# Patient Record
Sex: Female | Born: 1935
Health system: Southern US, Community
[De-identification: ages and names within clinical notes are randomized; demographics above are authoritative.]

## PROBLEM LIST (undated history)

## (undated) DIAGNOSIS — I509 Heart failure, unspecified: Secondary | ICD-10-CM

## (undated) DIAGNOSIS — D473 Essential (hemorrhagic) thrombocythemia: Secondary | ICD-10-CM

## (undated) DIAGNOSIS — I10 Essential (primary) hypertension: Secondary | ICD-10-CM

## (undated) DIAGNOSIS — E079 Disorder of thyroid, unspecified: Secondary | ICD-10-CM

## (undated) DIAGNOSIS — D75839 Thrombocytosis, unspecified: Secondary | ICD-10-CM

## (undated) DIAGNOSIS — R7303 Prediabetes: Secondary | ICD-10-CM

## (undated) HISTORY — PX: CHOLECYSTECTOMY: SHX55

---

## 2003-01-27 ENCOUNTER — Encounter: Payer: Self-pay | Admitting: Family Medicine

## 2003-01-27 ENCOUNTER — Ambulatory Visit (HOSPITAL_COMMUNITY): Admission: RE | Admit: 2003-01-27 | Discharge: 2003-01-27 | Payer: Self-pay | Admitting: Family Medicine

## 2003-09-17 ENCOUNTER — Ambulatory Visit (HOSPITAL_COMMUNITY): Admission: RE | Admit: 2003-09-17 | Discharge: 2003-09-17 | Payer: Self-pay | Admitting: Family Medicine

## 2004-05-23 ENCOUNTER — Ambulatory Visit (HOSPITAL_COMMUNITY): Admission: RE | Admit: 2004-05-23 | Discharge: 2004-05-23 | Payer: Self-pay | Admitting: Family Medicine

## 2004-07-12 ENCOUNTER — Ambulatory Visit: Payer: Self-pay | Admitting: *Deleted

## 2005-05-05 ENCOUNTER — Ambulatory Visit: Payer: Self-pay | Admitting: Family Medicine

## 2005-06-02 ENCOUNTER — Ambulatory Visit: Payer: Self-pay | Admitting: Internal Medicine

## 2005-08-01 ENCOUNTER — Ambulatory Visit: Payer: Self-pay | Admitting: Family Medicine

## 2005-08-08 ENCOUNTER — Ambulatory Visit (HOSPITAL_COMMUNITY): Admission: RE | Admit: 2005-08-08 | Discharge: 2005-08-08 | Payer: Self-pay | Admitting: Family Medicine

## 2005-08-09 ENCOUNTER — Ambulatory Visit: Payer: Self-pay | Admitting: Hematology and Oncology

## 2005-08-18 ENCOUNTER — Encounter (INDEPENDENT_AMBULATORY_CARE_PROVIDER_SITE_OTHER): Payer: Self-pay | Admitting: *Deleted

## 2005-08-18 ENCOUNTER — Other Ambulatory Visit: Admission: RE | Admit: 2005-08-18 | Discharge: 2005-08-18 | Payer: Self-pay | Admitting: Hematology and Oncology

## 2005-08-22 ENCOUNTER — Ambulatory Visit (HOSPITAL_COMMUNITY): Admission: RE | Admit: 2005-08-22 | Discharge: 2005-08-22 | Payer: Self-pay | Admitting: Hematology and Oncology

## 2005-10-17 ENCOUNTER — Ambulatory Visit: Payer: Self-pay | Admitting: Hematology and Oncology

## 2005-11-09 ENCOUNTER — Emergency Department (HOSPITAL_COMMUNITY): Admission: EM | Admit: 2005-11-09 | Discharge: 2005-11-09 | Payer: Self-pay | Admitting: Emergency Medicine

## 2006-02-01 ENCOUNTER — Ambulatory Visit: Payer: Self-pay | Admitting: Hematology and Oncology

## 2006-03-28 ENCOUNTER — Ambulatory Visit: Payer: Self-pay | Admitting: Hematology and Oncology

## 2006-03-30 LAB — CBC WITH DIFFERENTIAL/PLATELET
BASO%: 0.8 % (ref 0.0–2.0)
Basophils Absolute: 0 10*3/uL (ref 0.0–0.1)
EOS%: 4.7 % (ref 0.0–7.0)
Eosinophils Absolute: 0.3 10*3/uL (ref 0.0–0.5)
HCT: 34.2 % — ABNORMAL LOW (ref 34.8–46.6)
HGB: 11.3 g/dL — ABNORMAL LOW (ref 11.6–15.9)
LYMPH%: 38.8 % (ref 14.0–48.0)
MCH: 33 pg (ref 26.0–34.0)
MCHC: 33.1 g/dL (ref 32.0–36.0)
MCV: 99.6 fL (ref 81.0–101.0)
MONO#: 0.9 10*3/uL (ref 0.1–0.9)
MONO%: 13.9 % — ABNORMAL HIGH (ref 0.0–13.0)
NEUT#: 2.7 10*3/uL (ref 1.5–6.5)
NEUT%: 41.8 % (ref 39.6–76.8)
Platelets: 419 10*3/uL — ABNORMAL HIGH (ref 145–400)
RBC: 3.43 10*6/uL — ABNORMAL LOW (ref 3.70–5.32)
RDW: 14.1 % (ref 11.3–14.5)
WBC: 6.4 10*3/uL (ref 3.9–10.0)
lymph#: 2.5 10*3/uL (ref 0.9–3.3)

## 2006-03-30 LAB — COMPREHENSIVE METABOLIC PANEL
ALT: 9 U/L (ref 0–40)
AST: 19 U/L (ref 0–37)
Albumin: 3.8 g/dL (ref 3.5–5.2)
Alkaline Phosphatase: 69 U/L (ref 39–117)
BUN: 22 mg/dL (ref 6–23)
CO2: 30 mEq/L (ref 19–32)
Calcium: 9.3 mg/dL (ref 8.4–10.5)
Chloride: 101 mEq/L (ref 96–112)
Creatinine, Ser: 1.1 mg/dL (ref 0.4–1.2)
Glucose, Bld: 106 mg/dL — ABNORMAL HIGH (ref 70–99)
Potassium: 3.5 mEq/L (ref 3.5–5.3)
Sodium: 139 mEq/L (ref 135–145)
Total Bilirubin: 0.7 mg/dL (ref 0.3–1.2)
Total Protein: 8.1 g/dL (ref 6.0–8.3)

## 2007-01-09 ENCOUNTER — Ambulatory Visit: Payer: Self-pay | Admitting: Hematology and Oncology

## 2007-01-11 LAB — CBC WITH DIFFERENTIAL/PLATELET
BASO%: 0.8 % (ref 0.0–2.0)
Basophils Absolute: 0.1 10*3/uL (ref 0.0–0.1)
EOS%: 3.4 % (ref 0.0–7.0)
Eosinophils Absolute: 0.3 10*3/uL (ref 0.0–0.5)
HCT: 37 % (ref 34.8–46.6)
HGB: 12.4 g/dL (ref 11.6–15.9)
LYMPH%: 42.7 % (ref 14.0–48.0)
MCH: 28.5 pg (ref 26.0–34.0)
MCHC: 33.6 g/dL (ref 32.0–36.0)
MCV: 84.9 fL (ref 81.0–101.0)
MONO#: 0.8 10*3/uL (ref 0.1–0.9)
MONO%: 9.7 % (ref 0.0–13.0)
NEUT#: 3.4 10*3/uL (ref 1.5–6.5)
NEUT%: 43.4 % (ref 39.6–76.8)
Platelets: 716 10*3/uL — ABNORMAL HIGH (ref 145–400)
RBC: 4.36 10*6/uL (ref 3.70–5.32)
RDW: 14.7 % — ABNORMAL HIGH (ref 11.3–14.5)
WBC: 7.7 10*3/uL (ref 3.9–10.0)
lymph#: 3.3 10*3/uL (ref 0.9–3.3)

## 2007-01-24 LAB — BASIC METABOLIC PANEL
BUN: 18 mg/dL (ref 6–23)
CO2: 28 mEq/L (ref 19–32)
Calcium: 9.8 mg/dL (ref 8.4–10.5)
Chloride: 100 mEq/L (ref 96–112)
Creatinine, Ser: 1 mg/dL (ref 0.40–1.20)
Glucose, Bld: 85 mg/dL (ref 70–99)
Potassium: 3.6 mEq/L (ref 3.5–5.3)
Sodium: 140 mEq/L (ref 135–145)

## 2007-01-24 LAB — CBC WITH DIFFERENTIAL/PLATELET
BASO%: 0.7 % (ref 0.0–2.0)
Basophils Absolute: 0 10*3/uL (ref 0.0–0.1)
EOS%: 2.6 % (ref 0.0–7.0)
Eosinophils Absolute: 0.1 10*3/uL (ref 0.0–0.5)
HCT: 34.9 % (ref 34.8–46.6)
HGB: 11.8 g/dL (ref 11.6–15.9)
LYMPH%: 51.7 % — ABNORMAL HIGH (ref 14.0–48.0)
MCH: 28.7 pg (ref 26.0–34.0)
MCHC: 34 g/dL (ref 32.0–36.0)
MCV: 84.6 fL (ref 81.0–101.0)
MONO#: 0.3 10*3/uL (ref 0.1–0.9)
MONO%: 6.8 % (ref 0.0–13.0)
NEUT#: 1.9 10*3/uL (ref 1.5–6.5)
NEUT%: 38.2 % — ABNORMAL LOW (ref 39.6–76.8)
Platelets: 532 10*3/uL — ABNORMAL HIGH (ref 145–400)
RBC: 4.13 10*6/uL (ref 3.70–5.32)
RDW: 14.2 % (ref 11.3–14.5)
WBC: 4.9 10*3/uL (ref 3.9–10.0)
lymph#: 2.5 10*3/uL (ref 0.9–3.3)

## 2007-02-08 LAB — CBC WITH DIFFERENTIAL/PLATELET
BASO%: 0.6 % (ref 0.0–2.0)
Basophils Absolute: 0 10*3/uL (ref 0.0–0.1)
EOS%: 2 % (ref 0.0–7.0)
Eosinophils Absolute: 0.1 10*3/uL (ref 0.0–0.5)
HCT: 34.1 % — ABNORMAL LOW (ref 34.8–46.6)
HGB: 11.7 g/dL (ref 11.6–15.9)
LYMPH%: 51.5 % — ABNORMAL HIGH (ref 14.0–48.0)
MCH: 29.5 pg (ref 26.0–34.0)
MCHC: 34.4 g/dL (ref 32.0–36.0)
MCV: 85.9 fL (ref 81.0–101.0)
MONO#: 0.4 10*3/uL (ref 0.1–0.9)
MONO%: 7.7 % (ref 0.0–13.0)
NEUT#: 2 10*3/uL (ref 1.5–6.5)
NEUT%: 38.2 % — ABNORMAL LOW (ref 39.6–76.8)
Platelets: 364 10*3/uL (ref 145–400)
RBC: 3.97 10*6/uL (ref 3.70–5.32)
RDW: 15 % — ABNORMAL HIGH (ref 11.3–14.5)
WBC: 5.3 10*3/uL (ref 3.9–10.0)
lymph#: 2.7 10*3/uL (ref 0.9–3.3)

## 2007-02-08 LAB — BASIC METABOLIC PANEL
BUN: 20 mg/dL (ref 6–23)
CO2: 29 mEq/L (ref 19–32)
Calcium: 9.5 mg/dL (ref 8.4–10.5)
Chloride: 99 mEq/L (ref 96–112)
Creatinine, Ser: 0.94 mg/dL (ref 0.40–1.20)
Glucose, Bld: 136 mg/dL — ABNORMAL HIGH (ref 70–99)
Potassium: 3.4 mEq/L — ABNORMAL LOW (ref 3.5–5.3)
Sodium: 140 mEq/L (ref 135–145)

## 2007-02-20 ENCOUNTER — Ambulatory Visit: Payer: Self-pay | Admitting: Hematology and Oncology

## 2007-02-22 LAB — CBC WITH DIFFERENTIAL/PLATELET
BASO%: 0.7 % (ref 0.0–2.0)
Basophils Absolute: 0 10*3/uL (ref 0.0–0.1)
EOS%: 1 % (ref 0.0–7.0)
Eosinophils Absolute: 0 10*3/uL (ref 0.0–0.5)
HCT: 33.2 % — ABNORMAL LOW (ref 34.8–46.6)
HGB: 11.5 g/dL — ABNORMAL LOW (ref 11.6–15.9)
LYMPH%: 56.1 % — ABNORMAL HIGH (ref 14.0–48.0)
MCH: 29.9 pg (ref 26.0–34.0)
MCHC: 34.7 g/dL (ref 32.0–36.0)
MCV: 86.3 fL (ref 81.0–101.0)
MONO#: 0.4 10*3/uL (ref 0.1–0.9)
MONO%: 9 % (ref 0.0–13.0)
NEUT#: 1.4 10*3/uL — ABNORMAL LOW (ref 1.5–6.5)
NEUT%: 33.2 % — ABNORMAL LOW (ref 39.6–76.8)
Platelets: 322 10*3/uL (ref 145–400)
RBC: 3.85 10*6/uL (ref 3.70–5.32)
RDW: 15.5 % — ABNORMAL HIGH (ref 11.3–14.5)
WBC: 4.3 10*3/uL (ref 3.9–10.0)
lymph#: 2.4 10*3/uL (ref 0.9–3.3)

## 2007-03-11 LAB — CBC WITH DIFFERENTIAL/PLATELET
BASO%: 0.6 % (ref 0.0–2.0)
Basophils Absolute: 0 10*3/uL (ref 0.0–0.1)
EOS%: 1.4 % (ref 0.0–7.0)
Eosinophils Absolute: 0.1 10*3/uL (ref 0.0–0.5)
HCT: 30.2 % — ABNORMAL LOW (ref 34.8–46.6)
HGB: 10.5 g/dL — ABNORMAL LOW (ref 11.6–15.9)
LYMPH%: 56.9 % — ABNORMAL HIGH (ref 14.0–48.0)
MCH: 30.7 pg (ref 26.0–34.0)
MCHC: 34.6 g/dL (ref 32.0–36.0)
MCV: 88.7 fL (ref 81.0–101.0)
MONO#: 0.5 10*3/uL (ref 0.1–0.9)
MONO%: 10.2 % (ref 0.0–13.0)
NEUT#: 1.4 10*3/uL — ABNORMAL LOW (ref 1.5–6.5)
NEUT%: 30.9 % — ABNORMAL LOW (ref 39.6–76.8)
Platelets: 290 10*3/uL (ref 145–400)
RBC: 3.41 10*6/uL — ABNORMAL LOW (ref 3.70–5.32)
RDW: 24.7 % — ABNORMAL HIGH (ref 11.3–14.5)
WBC: 4.6 10*3/uL (ref 3.9–10.0)
lymph#: 2.6 10*3/uL (ref 0.9–3.3)

## 2007-03-11 LAB — COMPREHENSIVE METABOLIC PANEL
ALT: 12 U/L (ref 0–35)
AST: 18 U/L (ref 0–37)
Albumin: 3.9 g/dL (ref 3.5–5.2)
Alkaline Phosphatase: 59 U/L (ref 39–117)
BUN: 21 mg/dL (ref 6–23)
CO2: 29 mEq/L (ref 19–32)
Calcium: 9 mg/dL (ref 8.4–10.5)
Chloride: 102 mEq/L (ref 96–112)
Creatinine, Ser: 0.9 mg/dL (ref 0.40–1.20)
Glucose, Bld: 145 mg/dL — ABNORMAL HIGH (ref 70–99)
Potassium: 3.2 mEq/L — ABNORMAL LOW (ref 3.5–5.3)
Sodium: 143 mEq/L (ref 135–145)
Total Bilirubin: 0.6 mg/dL (ref 0.3–1.2)
Total Protein: 7.7 g/dL (ref 6.0–8.3)

## 2007-04-18 ENCOUNTER — Ambulatory Visit: Payer: Self-pay | Admitting: Hematology and Oncology

## 2007-04-22 LAB — COMPREHENSIVE METABOLIC PANEL
ALT: 10 U/L (ref 0–35)
AST: 18 U/L (ref 0–37)
Albumin: 4 g/dL (ref 3.5–5.2)
Alkaline Phosphatase: 58 U/L (ref 39–117)
BUN: 23 mg/dL (ref 6–23)
CO2: 27 mEq/L (ref 19–32)
Calcium: 9.6 mg/dL (ref 8.4–10.5)
Chloride: 104 mEq/L (ref 96–112)
Creatinine, Ser: 1.11 mg/dL (ref 0.40–1.20)
Glucose, Bld: 112 mg/dL — ABNORMAL HIGH (ref 70–99)
Potassium: 3.5 mEq/L (ref 3.5–5.3)
Sodium: 142 mEq/L (ref 135–145)
Total Bilirubin: 0.7 mg/dL (ref 0.3–1.2)
Total Protein: 7.9 g/dL (ref 6.0–8.3)

## 2007-04-22 LAB — CBC WITH DIFFERENTIAL/PLATELET
BASO%: 0.6 % (ref 0.0–2.0)
Basophils Absolute: 0 10*3/uL (ref 0.0–0.1)
EOS%: 1.4 % (ref 0.0–7.0)
Eosinophils Absolute: 0.1 10*3/uL (ref 0.0–0.5)
HCT: 30.4 % — ABNORMAL LOW (ref 34.8–46.6)
HGB: 10.5 g/dL — ABNORMAL LOW (ref 11.6–15.9)
LYMPH%: 52.9 % — ABNORMAL HIGH (ref 14.0–48.0)
MCH: 33.7 pg (ref 26.0–34.0)
MCHC: 34.5 g/dL (ref 32.0–36.0)
MCV: 97.9 fL (ref 81.0–101.0)
MONO#: 0.5 10*3/uL (ref 0.1–0.9)
MONO%: 9.8 % (ref 0.0–13.0)
NEUT#: 1.6 10*3/uL (ref 1.5–6.5)
NEUT%: 35.3 % — ABNORMAL LOW (ref 39.6–76.8)
Platelets: 353 10*3/uL (ref 145–400)
RBC: 3.11 10*6/uL — ABNORMAL LOW (ref 3.70–5.32)
RDW: 27 % — ABNORMAL HIGH (ref 11.3–14.5)
WBC: 4.6 10*3/uL (ref 3.9–10.0)
lymph#: 2.4 10*3/uL (ref 0.9–3.3)

## 2007-06-10 ENCOUNTER — Ambulatory Visit: Payer: Self-pay | Admitting: Hematology and Oncology

## 2012-08-21 DIAGNOSIS — H11159 Pinguecula, unspecified eye: Secondary | ICD-10-CM | POA: Diagnosis not present

## 2012-08-21 DIAGNOSIS — H2510 Age-related nuclear cataract, unspecified eye: Secondary | ICD-10-CM | POA: Diagnosis not present

## 2012-08-22 DIAGNOSIS — E039 Hypothyroidism, unspecified: Secondary | ICD-10-CM | POA: Diagnosis not present

## 2012-08-22 DIAGNOSIS — Z0000 Encounter for general adult medical examination without abnormal findings: Secondary | ICD-10-CM | POA: Diagnosis not present

## 2012-08-22 DIAGNOSIS — I10 Essential (primary) hypertension: Secondary | ICD-10-CM | POA: Diagnosis not present

## 2012-08-22 DIAGNOSIS — Z719 Counseling, unspecified: Secondary | ICD-10-CM | POA: Diagnosis not present

## 2012-08-23 DIAGNOSIS — Z0000 Encounter for general adult medical examination without abnormal findings: Secondary | ICD-10-CM | POA: Diagnosis not present

## 2012-08-29 DIAGNOSIS — D473 Essential (hemorrhagic) thrombocythemia: Secondary | ICD-10-CM | POA: Diagnosis not present

## 2012-08-29 DIAGNOSIS — E039 Hypothyroidism, unspecified: Secondary | ICD-10-CM | POA: Diagnosis not present

## 2012-08-29 DIAGNOSIS — I10 Essential (primary) hypertension: Secondary | ICD-10-CM | POA: Diagnosis not present

## 2012-08-29 DIAGNOSIS — R030 Elevated blood-pressure reading, without diagnosis of hypertension: Secondary | ICD-10-CM | POA: Diagnosis not present

## 2012-09-03 DIAGNOSIS — D649 Anemia, unspecified: Secondary | ICD-10-CM | POA: Diagnosis not present

## 2012-09-03 DIAGNOSIS — D473 Essential (hemorrhagic) thrombocythemia: Secondary | ICD-10-CM | POA: Diagnosis not present

## 2012-09-12 DIAGNOSIS — D473 Essential (hemorrhagic) thrombocythemia: Secondary | ICD-10-CM | POA: Diagnosis not present

## 2012-09-17 DIAGNOSIS — D473 Essential (hemorrhagic) thrombocythemia: Secondary | ICD-10-CM | POA: Diagnosis not present

## 2012-09-25 DIAGNOSIS — R220 Localized swelling, mass and lump, head: Secondary | ICD-10-CM | POA: Diagnosis not present

## 2012-10-08 DIAGNOSIS — D473 Essential (hemorrhagic) thrombocythemia: Secondary | ICD-10-CM | POA: Diagnosis not present

## 2012-10-18 DIAGNOSIS — Z719 Counseling, unspecified: Secondary | ICD-10-CM | POA: Diagnosis not present

## 2012-10-18 DIAGNOSIS — Z7182 Exercise counseling: Secondary | ICD-10-CM | POA: Diagnosis not present

## 2012-10-18 DIAGNOSIS — E039 Hypothyroidism, unspecified: Secondary | ICD-10-CM | POA: Diagnosis not present

## 2014-07-11 ENCOUNTER — Emergency Department (HOSPITAL_COMMUNITY): Payer: Medicare Other

## 2014-07-11 ENCOUNTER — Encounter (HOSPITAL_COMMUNITY): Payer: Self-pay | Admitting: Emergency Medicine

## 2014-07-11 ENCOUNTER — Observation Stay (HOSPITAL_COMMUNITY): Payer: Medicare Other

## 2014-07-11 ENCOUNTER — Observation Stay (HOSPITAL_COMMUNITY)
Admission: EM | Admit: 2014-07-11 | Discharge: 2014-07-13 | Disposition: A | Discharge status: Home / Self Care | Payer: Medicare Other | Attending: Internal Medicine | Admitting: Internal Medicine

## 2014-07-11 DIAGNOSIS — Z7982 Long term (current) use of aspirin: Secondary | ICD-10-CM | POA: Insufficient documentation

## 2014-07-11 DIAGNOSIS — Z91013 Allergy to seafood: Secondary | ICD-10-CM | POA: Insufficient documentation

## 2014-07-11 DIAGNOSIS — E876 Hypokalemia: Secondary | ICD-10-CM | POA: Diagnosis not present

## 2014-07-11 DIAGNOSIS — R51 Headache: Secondary | ICD-10-CM | POA: Insufficient documentation

## 2014-07-11 DIAGNOSIS — I2789 Other specified pulmonary heart diseases: Secondary | ICD-10-CM | POA: Diagnosis not present

## 2014-07-11 DIAGNOSIS — R0789 Other chest pain: Secondary | ICD-10-CM | POA: Diagnosis not present

## 2014-07-11 DIAGNOSIS — R52 Pain, unspecified: Secondary | ICD-10-CM | POA: Diagnosis not present

## 2014-07-11 DIAGNOSIS — E039 Hypothyroidism, unspecified: Secondary | ICD-10-CM | POA: Diagnosis present

## 2014-07-11 DIAGNOSIS — R079 Chest pain, unspecified: Principal | ICD-10-CM | POA: Diagnosis present

## 2014-07-11 DIAGNOSIS — Z79899 Other long term (current) drug therapy: Secondary | ICD-10-CM | POA: Insufficient documentation

## 2014-07-11 DIAGNOSIS — K5900 Constipation, unspecified: Secondary | ICD-10-CM | POA: Diagnosis present

## 2014-07-11 DIAGNOSIS — R112 Nausea with vomiting, unspecified: Secondary | ICD-10-CM | POA: Diagnosis not present

## 2014-07-11 DIAGNOSIS — J9819 Other pulmonary collapse: Secondary | ICD-10-CM | POA: Diagnosis not present

## 2014-07-11 DIAGNOSIS — K219 Gastro-esophageal reflux disease without esophagitis: Secondary | ICD-10-CM | POA: Insufficient documentation

## 2014-07-11 DIAGNOSIS — I160 Hypertensive urgency: Secondary | ICD-10-CM | POA: Diagnosis present

## 2014-07-11 DIAGNOSIS — D759 Disease of blood and blood-forming organs, unspecified: Secondary | ICD-10-CM | POA: Diagnosis not present

## 2014-07-11 DIAGNOSIS — I10 Essential (primary) hypertension: Secondary | ICD-10-CM | POA: Diagnosis present

## 2014-07-11 DIAGNOSIS — R519 Headache, unspecified: Secondary | ICD-10-CM

## 2014-07-11 DIAGNOSIS — R1110 Vomiting, unspecified: Secondary | ICD-10-CM | POA: Diagnosis not present

## 2014-07-11 DIAGNOSIS — R0602 Shortness of breath: Secondary | ICD-10-CM | POA: Insufficient documentation

## 2014-07-11 HISTORY — DX: Disorder of thyroid, unspecified: E07.9

## 2014-07-11 HISTORY — DX: Essential (primary) hypertension: I10

## 2014-07-11 LAB — I-STAT TROPONIN, ED: Troponin i, poc: 0.01 ng/mL (ref 0.00–0.08)

## 2014-07-11 LAB — CBC WITH DIFFERENTIAL/PLATELET
Basophils Absolute: 0.1 10*3/uL (ref 0.0–0.1)
Basophils Relative: 1 % (ref 0–1)
Eosinophils Absolute: 0.5 10*3/uL (ref 0.0–0.7)
Eosinophils Relative: 5 % (ref 0–5)
HCT: 37.8 % (ref 36.0–46.0)
Hemoglobin: 12.2 g/dL (ref 12.0–15.0)
Lymphocytes Relative: 39 % (ref 12–46)
Lymphs Abs: 3.4 10*3/uL (ref 0.7–4.0)
MCH: 26.3 pg (ref 26.0–34.0)
MCHC: 32.3 g/dL (ref 30.0–36.0)
MCV: 81.5 fL (ref 78.0–100.0)
Monocytes Absolute: 0.7 10*3/uL (ref 0.1–1.0)
Monocytes Relative: 8 % (ref 3–12)
Neutro Abs: 4.1 10*3/uL (ref 1.7–7.7)
Neutrophils Relative %: 47 % (ref 43–77)
RBC: 4.64 MIL/uL (ref 3.87–5.11)
RDW: 16.4 % — ABNORMAL HIGH (ref 11.5–15.5)
WBC: 8.8 10*3/uL (ref 4.0–10.5)

## 2014-07-11 LAB — BASIC METABOLIC PANEL
Anion gap: 15 (ref 5–15)
BUN: 23 mg/dL (ref 6–23)
CO2: 29 mEq/L (ref 19–32)
Calcium: 9.8 mg/dL (ref 8.4–10.5)
Chloride: 97 mEq/L (ref 96–112)
Creatinine, Ser: 0.94 mg/dL (ref 0.50–1.10)
GFR calc Af Amer: 67 mL/min — ABNORMAL LOW (ref >90)
GFR calc non Af Amer: 57 mL/min — ABNORMAL LOW (ref >90)
Glucose, Bld: 121 mg/dL — ABNORMAL HIGH (ref 70–99)
Potassium: 4.1 mEq/L (ref 3.7–5.3)
Sodium: 141 mEq/L (ref 137–147)

## 2014-07-11 LAB — LIPID PANEL
CHOL/HDL RATIO: 4.4 ratio
CHOLESTEROL: 216 mg/dL — AB (ref 0–200)
HDL: 49 mg/dL (ref >39)
LDL Cholesterol: 140 mg/dL — ABNORMAL HIGH (ref 0–99)
Triglycerides: 134 mg/dL (ref <150)
VLDL: 27 mg/dL (ref 0–40)

## 2014-07-11 LAB — TROPONIN I
Troponin I: <0.3 ng/mL (ref <0.30)
Troponin I: <0.3 ng/mL (ref <0.30)
Troponin I: <0.3 ng/mL (ref <0.30)

## 2014-07-11 LAB — PRO B NATRIURETIC PEPTIDE: PRO B NATRI PEPTIDE: 95.3 pg/mL (ref 0–450)

## 2014-07-11 LAB — TSH: TSH: 2.66 u[IU]/mL (ref 0.350–4.500)

## 2014-07-11 MED ORDER — ACETAMINOPHEN 325 MG PO TABS: 650.0000 mg | ORAL_TABLET | Freq: Four times a day (QID) | ORAL | Status: DC | PRN

## 2014-07-11 MED ORDER — POLYETHYLENE GLYCOL 3350 17 G PO PACK
17.0000 g | PACK | Freq: Every day | ORAL | Status: DC
  Administered 2014-07-11 – 2014-07-13 (×3): 17 g via ORAL
  Filled 2014-07-11 (×3): qty 1

## 2014-07-11 MED ORDER — ONDANSETRON HCL 4 MG/2ML IJ SOLN: 4.0000 mg | Freq: Four times a day (QID) | INTRAMUSCULAR | Status: DC | PRN

## 2014-07-11 MED ORDER — MORPHINE SULFATE 2 MG/ML IJ SOLN
1.0000 mg | INTRAMUSCULAR | Status: DC | PRN
  Administered 2014-07-12: 1 mg via INTRAVENOUS
  Filled 2014-07-11: qty 1

## 2014-07-11 MED ORDER — ASPIRIN 325 MG PO TABS
325.0000 mg | ORAL_TABLET | Freq: Once | ORAL | Status: AC
  Administered 2014-07-11: 325 mg via ORAL
  Filled 2014-07-11: qty 1

## 2014-07-11 MED ORDER — HYDROCHLOROTHIAZIDE 25 MG PO TABS
25.0000 mg | ORAL_TABLET | Freq: Every morning | ORAL | Status: DC
  Administered 2014-07-12 – 2014-07-13 (×2): 25 mg via ORAL
  Filled 2014-07-11 (×2): qty 1

## 2014-07-11 MED ORDER — MORPHINE SULFATE 4 MG/ML IJ SOLN
4.0000 mg | Freq: Once | INTRAMUSCULAR | Status: AC
  Administered 2014-07-11: 4 mg via INTRAVENOUS
  Filled 2014-07-11: qty 1

## 2014-07-11 MED ORDER — PROMETHAZINE HCL 25 MG/ML IJ SOLN
12.5000 mg | Freq: Once | INTRAMUSCULAR | Status: AC
  Administered 2014-07-11: 12.5 mg via INTRAVENOUS
  Filled 2014-07-11: qty 1

## 2014-07-11 MED ORDER — ACETAMINOPHEN 650 MG RE SUPP: 650.0000 mg | Freq: Four times a day (QID) | RECTAL | Status: DC | PRN

## 2014-07-11 MED ORDER — NITROGLYCERIN 0.4 MG SL SUBL
0.4000 mg | SUBLINGUAL_TABLET | SUBLINGUAL | Status: AC | PRN
  Administered 2014-07-11 (×3): 0.4 mg via SUBLINGUAL
  Filled 2014-07-11: qty 1

## 2014-07-11 MED ORDER — LEVOTHYROXINE SODIUM 100 MCG PO TABS
100.0000 ug | ORAL_TABLET | Freq: Every day | ORAL | Status: DC
  Administered 2014-07-12 – 2014-07-13 (×2): 100 ug via ORAL
  Filled 2014-07-11 (×3): qty 1

## 2014-07-11 MED ORDER — ONDANSETRON HCL 4 MG/2ML IJ SOLN
INTRAMUSCULAR | Status: AC
  Filled 2014-07-11: qty 2

## 2014-07-11 MED ORDER — ASPIRIN EC 325 MG PO TBEC
325.0000 mg | DELAYED_RELEASE_TABLET | Freq: Every day | ORAL | Status: DC
  Administered 2014-07-12 – 2014-07-13 (×3): 325 mg via ORAL
  Filled 2014-07-11 (×2): qty 1

## 2014-07-11 MED ORDER — ENOXAPARIN SODIUM 40 MG/0.4ML ~~LOC~~ SOLN
40.0000 mg | SUBCUTANEOUS | Status: DC
  Administered 2014-07-11 – 2014-07-12 (×2): 40 mg via SUBCUTANEOUS
  Filled 2014-07-11 (×3): qty 0.4

## 2014-07-11 MED ORDER — ONDANSETRON HCL 4 MG PO TABS: 4.0000 mg | ORAL_TABLET | Freq: Four times a day (QID) | ORAL | Status: DC | PRN

## 2014-07-11 MED ORDER — HYDRALAZINE HCL 20 MG/ML IJ SOLN
10.0000 mg | Freq: Four times a day (QID) | INTRAMUSCULAR | Status: DC | PRN
  Administered 2014-07-11 – 2014-07-12 (×2): 10 mg via INTRAVENOUS
  Filled 2014-07-11 (×2): qty 1

## 2014-07-11 MED ORDER — SODIUM CHLORIDE 0.9 % IV BOLUS (SEPSIS)
500.0000 mL | Freq: Once | INTRAVENOUS | Status: AC
  Administered 2014-07-11: 500 mL via INTRAVENOUS

## 2014-07-11 MED ORDER — ASPIRIN EC 325 MG PO TBEC
325.0000 mg | DELAYED_RELEASE_TABLET | Freq: Every day | ORAL | Status: DC
  Filled 2014-07-11: qty 1

## 2014-07-11 MED ORDER — AMLODIPINE BESYLATE 5 MG PO TABS
5.0000 mg | ORAL_TABLET | Freq: Every day | ORAL | Status: DC
  Administered 2014-07-11 – 2014-07-13 (×3): 5 mg via ORAL
  Filled 2014-07-11 (×3): qty 1

## 2014-07-11 MED ORDER — ONDANSETRON HCL 4 MG/2ML IJ SOLN
4.0000 mg | Freq: Once | INTRAMUSCULAR | Status: AC
  Administered 2014-07-11: 4 mg via INTRAVENOUS

## 2014-07-11 NOTE — ED Notes (Signed)
MD at bedside. 

## 2014-07-11 NOTE — ED Notes (Addendum)
Pt/family report onset of l/shoulder and l/chest pain yesterday evening. Reports shortness of breath and dizziness while walking. Denies NVD. Pt is alert, oriented and appropriate. Daughter at bedside.Daughter reports that pt had very elevated BP last night 210/105. EDP at bedside. Pt stated that head and chest" hurt a little".

## 2014-07-11 NOTE — H&P (Signed)
History and Physical  [name] QQP:[id number] DOB: [birth year] DOA: 07/11/2014   PCP: PROVIDER NOT IN SYSTEM   Chief Complaint: chest pain, sob, HA  HPI:  78 year old female with history of hypertension and hypothyroidism presented with headache, chest discomfort with associated shortness of breath that began around midnight on 07/11/2014. The patient will cut from sleep with headache and chest discomfort. She had associated shortness of breath with some diaphoresis and nausea. The patient denied any dizziness or syncope. Her symptoms persisted throughout the early morning. As a result she came to the emergency department for further evaluation. She stated that the left-sided chest discomfort radiated to her left shoulder. She was given nitroglycerin x2 in the emergency department which relieved her discomfort. She is presently chest pain-free. The patient has never seen a cardiologist nor has she noted any heart problems in the past. However, when she took her blood pressure at home at the onset of her symptoms, it was noted to be 190/101. In the emergency department, her blood pressure was 188/95 initially, but dropped to 150/82 with morphine. The patient has denied any fevers, chills, coughing, hemoptysis, vomiting, abdominal pain, diarrhea, dysuria, hematuria. She denies any focal extremity weakness, dysarthria, facial droop, visual disturbance there In the emergency department, the patient was noted to have an abnormal EKG with T wave inversion diffusely. Her chest x-ray was nonacute. CT of the brain was nonacute. BMP and CBC were unremarkable. Initial troponin was negative. Assessment/Plan: Chest pain -Concerning for angina -Continue aspirin -Place on telemetry -Cycle troponins -I have consulted cardiology--spoke with Dr. Ron Parker Hypertensive urgency -Continue HCTZ -Add amlodipine -Hydralazine when necessary SBP >180 -Echocardiogram Hypothyroidism -TSH -Continue  Synthroid       Past Medical History  Diagnosis Date  . Hypertension   . Thyroid disease    Past Surgical History  Procedure Laterality Date  . Cholecystectomy     Social History:  reports that she has never smoked. She does not have any smokeless tobacco history on file. She reports that she does not drink alcohol. Her drug history is not on file.   Family History--mom and dad had HTN but no heart disease  Allergies  Allergen Reactions  . Shellfish Allergy Hives      Prior to Admission medications   Medication Sig Start Date End Date Taking? Authorizing Provider  acetaminophen (TYLENOL) 325 MG tablet Take 650 mg by mouth every 6 (six) hours as needed for moderate pain or headache.   Yes Historical Provider, MD  hydrochlorothiazide (HYDRODIURIL) 25 MG tablet Take 25 mg by mouth every morning.   Yes Historical Provider, MD  levothyroxine (SYNTHROID, LEVOTHROID) 100 MCG tablet Take 100 mcg by mouth daily before breakfast.   Yes Historical Provider, MD    Review of Systems:  Constitutional:  No weight loss, night sweats, Fevers, chills, fatigue.  Head&Eyes: No headache.  No vision loss.  ENT:  No Difficulty swallowing,Tooth/dental problems,Sore throat,   Cardio-vascular:  No chest pain, Orthopnea, PND, swelling in lower extremities,  dizziness, palpitations  GI:  No  abdominal pain, nausea, vomiting, diarrhea, loss of appetite, hematochezia, melena, heartburn, indigestion, Resp:   No cough. No coughing up of blood .No wheezing.No chest wall deformity  Skin:  no rash or lesions.  GU:  no dysuria, change in color of urine, no urgency or frequency. No flank pain.  Musculoskeletal:  No joint pain or swelling. No decreased range of motion. No back pain.  Psych:  No change in mood  or affect. No depression or anxiety. Neurologic: No  no dysesthesia, no focal weakness, no vision loss. No syncope  Physical Exam: Filed Vitals:   07/11/14 1225 07/11/14 1252 07/11/14  1300 07/11/14 1310  BP: 151/95 141/79 132/84 150/82  Pulse: 68 55 58 60  Temp:      Resp: 19 15 22 19   Weight:      SpO2: 93% 96% 95% 97%   General:  A&O x 3, NAD, nontoxic, pleasant/cooperative Head/Eye: No conjunctival hemorrhage, no icterus, [place]/AT, No nystagmus ENT:  No icterus,  No thrush, good dentition, no pharyngeal exudate Neck:  No masses, no lymphadenpathy, no bruits CV:  RRR, no rub, no gallop, no S3 Lung:  CTAB, good air movement, no wheeze, no rhonchi Abdomen: soft, +BS, nondistended, no peritoneal signs, mild epigastric tenderness without any guarding or peritoneal signs  Ext: No cyanosis, No rashes, No petechiae, No lymphangitis, No edema Neuro: CNII-XII intact, strength 4/5 in bilateral upper and lower extremities, no dysmetria  Labs on Admission:  Basic Metabolic Panel:  Recent Labs Lab 07/11/14 1012  NA 141  K 4.1  CL 97  CO2 29  GLUCOSE 121*  BUN 23  CREATININE 0.94  CALCIUM 9.8   Liver Function Tests: No results found for this basename: AST, ALT, ALKPHOS, BILITOT, PROT, ALBUMIN,  in the last 168 hours No results found for this basename: LIPASE, AMYLASE,  in the last 168 hours No results found for this basename: AMMONIA,  in the last 168 hours CBC:  Recent Labs Lab 07/11/14 1012  WBC 8.8  NEUTROABS 4.1  HGB 12.2  HCT 37.8  MCV 81.5  PLT PLATELET CLUMPS NOTED ON SMEAR, COUNT APPEARS INCREASED   Cardiac Enzymes:  Recent Labs Lab 07/11/14 1012  TROPONINI <0.30   BNP: No components found with this basename: POCBNP,  CBG: No results found for this basename: GLUCAP,  in the last 168 hours  Radiological Exams on Admission: Dg Chest 2 View  07/11/2014   CLINICAL DATA:  Short of breath  EXAM: CHEST  2 VIEW  COMPARISON:  11/09/2005  FINDINGS: Chronic airspace disease in the right middle lobe. Heart is mildly enlarged. Pulmonary vascularity is within normal limits. Minimal linear atelectasis for scar at the left base. Aorta is tortuous.   IMPRESSION: No active cardiopulmonary disease.  Chronic changes.   Electronically Signed   By: Maryclare Bean M.D.   On: 07/11/2014 11:02   Ct Head Wo Contrast  07/11/2014   CLINICAL DATA:  Headache, hypertensive episode  EXAM: CT HEAD WITHOUT CONTRAST  TECHNIQUE: Contiguous axial images were obtained from the base of the skull through the vertex without intravenous contrast.  COMPARISON:  None.  FINDINGS: No acute hemorrhage, infarct, or mass lesion is identified. No midline shift. Ventricles are normal in size. Orbits and paranasal sinuses are unremarkable. No skull fracture.  IMPRESSION: No acute intracranial findings.   Electronically Signed   By: Conchita Paris M.D.   On: 07/11/2014 11:07    EKG: Independently reviewed. Sinus with diffuse T-wave inversion    Time spent:60 minutes Code Status:   FULL Family Communication:   Daughter at bedside   Amanda Sobieski, DO  [facility] Pager [phone]  If 7PM-7AM, please contact night-coverage [email or web address] Password [email or web address] 07/11/2014, 1:43 PM

## 2014-07-11 NOTE — Progress Notes (Signed)
Pt has vomited x 2 since here on the floor, MD aware, see new orders

## 2014-07-11 NOTE — Consult Note (Signed)
CARDIOLOGY CONSULT NOTE   Patient ID: [name] MRN: [id number] DOB/AGE: Dec 27, 1935 78 y.o.  Admit Date: 07/11/2014  Primary Physician: PROVIDER NOT [place]  Cardiologist:   Rodney Langton      Clinical Summary [name] is a 78 y.o.female. She is admitted with chest discomfort. Currently she is not able to give as good a history as she did earlier today. She received morphine and Zofran and currently her mental status is not completely normal. There is a family member in the room who acknowledges this. The patient is stable. By history she had a headache last night and her blood pressure was high. She came to the emergency room and then mentioned chest discomfort. It appeared that the chest discomfort was helped with nitroglycerin. Her troponin is normal. Her EKG does not show any diagnostic changes. There is no prior history of any significant cardiac disease. EKG reveals nonspecific ST changes.   Allergies  Allergen Reactions  . Shellfish Allergy Hives    Medications Scheduled Medications: . amLODipine  5 mg Oral Daily  . [START ON 07/12/2014] aspirin EC  325 mg Oral Daily  . enoxaparin (LOVENOX) injection  40 mg Subcutaneous Q24H  . [START ON 07/12/2014] hydrochlorothiazide  25 mg Oral q morning - 10a  . [START ON 07/12/2014] levothyroxine  100 mcg Oral QAC breakfast  . ondansetron         Infusions:     PRN Medications:  acetaminophen, acetaminophen, acetaminophen, morphine injection, ondansetron (ZOFRAN) IV, ondansetron   Past Medical History  Diagnosis Date  . Hypertension   . Thyroid disease     Past Surgical History  Procedure Laterality Date  . Cholecystectomy      Family history is reviewed. There is no significant family history of coronary disease.  Social History [name] reports that she has never smoked. She does not have any smokeless tobacco history on file. [name] reports that she does not drink alcohol.  Review of Systems  Patient is  disoriented currently related to her medicines. I cannot take a complete review of systems.  Physical Examination Blood pressure 170/82, pulse 57, temperature 97.6 F (36.4 C), temperature source Oral, resp. rate 18, height 5\' 2"  (1.575 m), weight 163 lb 9.3 oz (74.2 kg), SpO2 100.00%. No intake or output data in the 24 hours ending 07/11/14 1544  At this time the patient is disoriented related to medicines. Her answers are difficult to assess. Head is atraumatic. Lungs reveal scattered rhonchi. There is no respiratory distress. Cardiac exam reveals S1 and S2. The abdomen is soft. There is no significant peripheral edema. There are no musculoskeletal deformities. There are no skin rashes.  Prior Cardiac Testing/Procedures  Lab Results  Basic Metabolic Panel:  Recent Labs Lab 07/11/14 1012  NA 141  K 4.1  CL 97  CO2 29  GLUCOSE 121*  BUN 23  CREATININE 0.94  CALCIUM 9.8    Liver Function Tests: No results found for this basename: AST, ALT, ALKPHOS, BILITOT, PROT, ALBUMIN,  in the last 168 hours  CBC:  Recent Labs Lab 07/11/14 1012  WBC 8.8  NEUTROABS 4.1  HGB 12.2  HCT 37.8  MCV 81.5  PLT PLATELET CLUMPS NOTED ON SMEAR, COUNT APPEARS INCREASED    Cardiac Enzymes:  Recent Labs Lab 07/11/14 1012  TROPONINI <0.30    BNP: No components found with this basename: POCBNP,    Radiology: Dg Chest 2 View  07/11/2014   CLINICAL DATA:  Short of breath  EXAM: CHEST  2 VIEW  COMPARISON:  11/09/2005  FINDINGS: Chronic airspace disease in the right middle lobe. Heart is mildly enlarged. Pulmonary vascularity is within normal limits. Minimal linear atelectasis for scar at the left base. Aorta is tortuous.  IMPRESSION: No active cardiopulmonary disease.  Chronic changes.   Electronically Signed   By: Maryclare Bean M.D.   On: 07/11/2014 11:02   Ct Head Wo Contrast  07/11/2014   CLINICAL DATA:  Headache, hypertensive episode  EXAM: CT HEAD WITHOUT CONTRAST  TECHNIQUE: Contiguous  axial images were obtained from the base of the skull through the vertex without intravenous contrast.  COMPARISON:  None.  FINDINGS: No acute hemorrhage, infarct, or mass lesion is identified. No midline shift. Ventricles are normal in size. Orbits and paranasal sinuses are unremarkable. No skull fracture.  IMPRESSION: No acute intracranial findings.   Electronically Signed   By: Conchita Paris M.D.   On: 07/11/2014 11:07     ECG:  EKG reveals increased voltage and nonspecific ST changes.   Impression and Recommendations     Chest pain     She first had a headache and hypertension. Head CT revealed no abnormality. She then had chest discomfort that seemed to respond to nitroglycerin. Troponins are negative so far. I agree with cycling her troponins completely. Two-dimensional echo is being arranged. Based on this data I will help decide tomorrow what type of cardiac workup should be completed.    Hypertensive urgency    Hypothyroidism  Daryel November, MD  07/11/2014, 3:44 PM

## 2014-07-11 NOTE — ED Provider Notes (Signed)
CSN: [id number]     Arrival date & time 07/11/14  8413 History   First MD Initiated Contact with Patient 07/11/14 1008     Chief Complaint  Patient presents with  . Chest Pain    12 hr hx of chest pain l/side  . Dizziness  . Shortness of Breath    shortness of brath when walking  . Headache    headache last night     (Consider location/radiation/quality/duration/timing/severity/associated sxs/prior Treatment) HPI Comments: Pt reports she started feeling bad about midnight, took her BP and it was 210/105. She was having h/a and sharp/tight L sided CP with radiation to L shoulder, assoc nausea, diaphoresis, SOB. She additionally has had a frontal h/a since that time. No report of numbness, weakness, confusion. She states CP and H/a are both improved but rates them at 8/10.   Patient is a 78 y.o. female presenting with chest pain, dizziness, shortness of breath, and headaches. The history is provided by the patient. No language interpreter was used.  Chest Pain Pain location:  L chest Pain quality: sharp   Pain quality comment:  Tightness Pain radiates to:  L shoulder Pain radiates to the back: no   Pain severity:  Severe Onset quality:  Sudden Duration:  10 minutes Timing:  Intermittent Progression:  Waxing and waning Chronicity:  New Context: at rest   Relieved by:  Nothing Worsened by:  Nothing tried Ineffective treatments: HCTZ. Associated symptoms: diaphoresis, dizziness, headache, nausea and shortness of breath   Associated symptoms: no abdominal pain, no back pain, no cough, no fatigue, no fever, no numbness, no palpitations, not vomiting and no weakness   Headaches:    Severity:  Severe   Duration:  10 hours   Timing:  Constant   Progression:  Waxing and waning   Chronicity:  New Shortness of breath:    Severity:  Unable to specify   Onset quality:  Unable to specify   Duration:  10 hours   Timing:  Constant   Progression:  Waxing and waning Risk factors:  hypertension   Risk factors: no aortic disease, no coronary artery disease, no diabetes mellitus, no high cholesterol, not female, no prior DVT/PE and no smoking   Dizziness Associated symptoms: chest pain, headaches, nausea and shortness of breath   Associated symptoms: no diarrhea, no palpitations and no vomiting   Shortness of Breath Associated symptoms: chest pain, diaphoresis and headaches   Associated symptoms: no abdominal pain, no cough, no fever, no neck pain, no sore throat and no vomiting   Headache Associated symptoms: dizziness and nausea   Associated symptoms: no abdominal pain, no back pain, no congestion, no cough, no diarrhea, no fatigue, no fever, no neck pain, no neck stiffness, no numbness, no photophobia, no sore throat and no vomiting     Past Medical History  Diagnosis Date  . Hypertension   . Thyroid disease    Past Surgical History  Procedure Laterality Date  . Cholecystectomy     History reviewed. No pertinent family history. History  Substance Use Topics  . Smoking status: Never Smoker   . Smokeless tobacco: Not on file  . Alcohol Use: No   OB History   Grav Para Term Preterm Abortions TAB SAB Ect Mult Living                 Review of Systems  Constitutional: Positive for diaphoresis. Negative for fever, chills, activity change, appetite change and fatigue.  HENT: Negative for congestion,  facial swelling, rhinorrhea and sore throat.   Eyes: Negative for photophobia and discharge.  Respiratory: Positive for shortness of breath. Negative for cough and chest tightness.   Cardiovascular: Positive for chest pain. Negative for palpitations and leg swelling.  Gastrointestinal: Positive for nausea. Negative for vomiting, abdominal pain and diarrhea.  Endocrine: Negative for polydipsia and polyuria.  Genitourinary: Negative for dysuria, frequency, difficulty urinating and pelvic pain.  Musculoskeletal: Negative for arthralgias, back pain, neck pain and neck  stiffness.  Skin: Negative for color change and wound.  Allergic/Immunologic: Negative for immunocompromised state.  Neurological: Positive for dizziness and headaches. Negative for facial asymmetry, weakness and numbness.  Hematological: Does not bruise/bleed easily.  Psychiatric/Behavioral: Negative for confusion and agitation.      Allergies  Shellfish allergy  Home Medications   Prior to Admission medications   Medication Sig Start Date End Date Taking? Authorizing Provider  acetaminophen (TYLENOL) 325 MG tablet Take 650 mg by mouth every 6 (six) hours as needed for moderate pain or headache.   Yes Historical Provider, MD  hydrochlorothiazide (HYDRODIURIL) 25 MG tablet Take 25 mg by mouth every morning.   Yes Historical Provider, MD  levothyroxine (SYNTHROID, LEVOTHROID) 100 MCG tablet Take 100 mcg by mouth daily before breakfast.   Yes Historical Provider, MD   BP 170/82  Pulse 57  Temp(Src) 97.6 F (36.4 C) (Oral)  Resp 18  Ht 5\' 2"  (1.575 m)  Wt 163 lb 9.3 oz (74.2 kg)  BMI 29.91 kg/m2  SpO2 100% Physical Exam  Constitutional: She is oriented to person, place, and time. She appears well-developed and well-nourished. No distress.  HENT:  Head: Normocephalic and atraumatic.  Mouth/Throat: No oropharyngeal exudate.  Eyes: Pupils are equal, round, and reactive to light.  Neck: Normal range of motion. Neck supple.  Cardiovascular: Normal rate, regular rhythm and normal heart sounds.  Exam reveals no gallop and no friction rub.   No murmur heard. Pulmonary/Chest: Effort normal and breath sounds normal. No respiratory distress. She has no wheezes. She has no rales.    Abdominal: Soft. Bowel sounds are normal. She exhibits no distension and no mass. There is tenderness in the epigastric area. There is no rigidity, no rebound and no guarding.    Musculoskeletal: Normal range of motion. She exhibits no edema and no tenderness.  Neurological: She is alert and oriented to  person, place, and time.  Skin: Skin is warm and dry.  Psychiatric: She has a normal mood and affect.    ED Course  Procedures (including critical care time) Labs Review Labs Reviewed  CBC WITH DIFFERENTIAL - Abnormal; Notable for the following:    RDW 16.4 (*)    All other components within normal limits  BASIC METABOLIC PANEL - Abnormal; Notable for the following:    Glucose, Bld 121 (*)    GFR calc non Af Amer 57 (*)    GFR calc Af Amer 67 (*)    All other components within normal limits  LIPID PANEL - Abnormal; Notable for the following:    Cholesterol 216 (*)    LDL Cholesterol 140 (*)    All other components within normal limits  TROPONIN I  PRO B NATRIURETIC PEPTIDE  TROPONIN I  TSH  PATHOLOGIST SMEAR REVIEW  TROPONIN I  BASIC METABOLIC PANEL  I-STAT TROPOININ, ED    Imaging Review Dg Chest 2 View  07/11/2014   CLINICAL DATA:  Short of breath  EXAM: CHEST  2 VIEW  COMPARISON:  11/09/2005  FINDINGS: Chronic airspace disease in the right middle lobe. Heart is mildly enlarged. Pulmonary vascularity is within normal limits. Minimal linear atelectasis for scar at the left base. Aorta is tortuous.  IMPRESSION: No active cardiopulmonary disease.  Chronic changes.   Electronically Signed   By: Maryclare Bean M.D.   On: 07/11/2014 11:02   Ct Head Wo Contrast  07/11/2014   CLINICAL DATA:  Headache, hypertensive episode  EXAM: CT HEAD WITHOUT CONTRAST  TECHNIQUE: Contiguous axial images were obtained from the base of the skull through the vertex without intravenous contrast.  COMPARISON:  None.  FINDINGS: No acute hemorrhage, infarct, or mass lesion is identified. No midline shift. Ventricles are normal in size. Orbits and paranasal sinuses are unremarkable. No skull fracture.  IMPRESSION: No acute intracranial findings.   Electronically Signed   By: Conchita Paris M.D.   On: 07/11/2014 11:07     EKG Interpretation   Date/Time:  Saturday July 11 2014 10:02:12 EDT Ventricular  Rate:  69 PR Interval:  177 QRS Duration: 96 QT Interval:  448 QTC Calculation: 480 R Axis:   27 Text Interpretation:  Age not entered, assumed to be  78 years old for  purpose of ECG interpretation Sinus rhythm Abnormal R-wave progression,  early transition Borderline T wave abnormalities Borderline prolonged QT  interval No prior for comparison Confirmed by Dynastee Brummell  MD, Jazmen Lindenbaum ([id number])  on 07/11/2014 10:08:06 AM      MDM   Final diagnoses:  Chest pain, unspecified chest pain type  Hypertensive urgency  Acute nonintractable headache, unspecified headache type    Pt is a 78 y.o. female with Pmhx as above who presents with CP, h/a, and elevated BP since about midnight. EKG w/ TWI in precordial leads, no prior for comparison. BP slowing improving in ED, CP from 7/10 to 2/10 after SL NTG. First trop neg, CXR, CT head nml. Symptoms suspicious for ACS or hypertensive urgency, and EKG is not reassuring. Will admit to [facility] for further w/u.         Ernestina Patches, MD 07/11/14 [phone]

## 2014-07-11 NOTE — ED Notes (Signed)
Pt ambulatory to restroom with no complaints of dizziness

## 2014-07-11 NOTE — ED Notes (Signed)
Report called to floor

## 2014-07-11 NOTE — ED Notes (Signed)
EKG completed and given to MD. Erich Montane, RN at bedside.

## 2014-07-12 ENCOUNTER — Observation Stay (HOSPITAL_COMMUNITY): Payer: Medicare Other

## 2014-07-12 DIAGNOSIS — I059 Rheumatic mitral valve disease, unspecified: Secondary | ICD-10-CM | POA: Diagnosis not present

## 2014-07-12 DIAGNOSIS — R791 Abnormal coagulation profile: Secondary | ICD-10-CM | POA: Diagnosis not present

## 2014-07-12 DIAGNOSIS — R1110 Vomiting, unspecified: Secondary | ICD-10-CM | POA: Diagnosis not present

## 2014-07-12 DIAGNOSIS — K5900 Constipation, unspecified: Secondary | ICD-10-CM | POA: Diagnosis not present

## 2014-07-12 DIAGNOSIS — E876 Hypokalemia: Secondary | ICD-10-CM | POA: Diagnosis not present

## 2014-07-12 DIAGNOSIS — R0609 Other forms of dyspnea: Secondary | ICD-10-CM | POA: Diagnosis not present

## 2014-07-12 DIAGNOSIS — R079 Chest pain, unspecified: Secondary | ICD-10-CM | POA: Diagnosis not present

## 2014-07-12 DIAGNOSIS — I10 Essential (primary) hypertension: Secondary | ICD-10-CM | POA: Diagnosis not present

## 2014-07-12 DIAGNOSIS — R0989 Other specified symptoms and signs involving the circulatory and respiratory systems: Secondary | ICD-10-CM | POA: Diagnosis not present

## 2014-07-12 LAB — BASIC METABOLIC PANEL
Anion gap: 14 (ref 5–15)
BUN: 22 mg/dL (ref 6–23)
CHLORIDE: 96 meq/L (ref 96–112)
CO2: 32 mEq/L (ref 19–32)
CREATININE: 1.12 mg/dL — AB (ref 0.50–1.10)
Calcium: 9.4 mg/dL (ref 8.4–10.5)
GFR calc non Af Amer: 46 mL/min — ABNORMAL LOW (ref >90)
GFR, EST AFRICAN AMERICAN: 54 mL/min — AB (ref >90)
Glucose, Bld: 109 mg/dL — ABNORMAL HIGH (ref 70–99)
Potassium: 3.2 mEq/L — ABNORMAL LOW (ref 3.7–5.3)
SODIUM: 142 meq/L (ref 137–147)

## 2014-07-12 LAB — MAGNESIUM: MAGNESIUM: 1.8 mg/dL (ref 1.5–2.5)

## 2014-07-12 LAB — D-DIMER, QUANTITATIVE: D-Dimer, Quant: 1.48 ug/mL-FEU — ABNORMAL HIGH (ref 0.00–0.48)

## 2014-07-12 MED ORDER — ALUM & MAG HYDROXIDE-SIMETH 200-200-20 MG/5ML PO SUSP
30.0000 mL | Freq: Four times a day (QID) | ORAL | Status: DC | PRN
  Administered 2014-07-12: 30 mL via ORAL
  Filled 2014-07-12: qty 30

## 2014-07-12 MED ORDER — SENNA 8.6 MG PO TABS
2.0000 | ORAL_TABLET | Freq: Every day | ORAL | Status: DC
  Administered 2014-07-12 – 2014-07-13 (×2): 17.2 mg via ORAL
  Filled 2014-07-12 (×3): qty 2

## 2014-07-12 MED ORDER — SODIUM CHLORIDE 0.9 % IJ SOLN
3.0000 mL | Freq: Two times a day (BID) | INTRAMUSCULAR | Status: DC
  Administered 2014-07-13 (×2): 3 mL via INTRAVENOUS

## 2014-07-12 MED ORDER — IOHEXOL 350 MG/ML SOLN
100.0000 mL | Freq: Once | INTRAVENOUS | Status: AC | PRN
  Administered 2014-07-12: 100 mL via INTRAVENOUS

## 2014-07-12 MED ORDER — PANTOPRAZOLE SODIUM 40 MG PO TBEC
40.0000 mg | DELAYED_RELEASE_TABLET | Freq: Every day | ORAL | Status: DC
  Administered 2014-07-12 – 2014-07-13 (×2): 40 mg via ORAL
  Filled 2014-07-12 (×3): qty 1

## 2014-07-12 MED ORDER — MILK AND MOLASSES ENEMA
1.0000 | Freq: Once | RECTAL | Status: AC
  Administered 2014-07-12: 250 mL via RECTAL
  Filled 2014-07-12: qty 250

## 2014-07-12 MED ORDER — POTASSIUM CHLORIDE CRYS ER 20 MEQ PO TBCR
40.0000 meq | EXTENDED_RELEASE_TABLET | Freq: Once | ORAL | Status: AC
  Administered 2014-07-12: 40 meq via ORAL
  Filled 2014-07-12: qty 2

## 2014-07-12 NOTE — Progress Notes (Signed)
Spoke with nuc med at [facility] to arrange transport. They are to pick pt up by 8 am Monday for stress test

## 2014-07-12 NOTE — Progress Notes (Signed)
UR completed 

## 2014-07-12 NOTE — Progress Notes (Signed)
Echocardiogram 2D Echocardiogram has been performed.  Doyle Askew 07/12/2014, 8:59 AM

## 2014-07-12 NOTE — Progress Notes (Signed)
Pt has had 4 BMs since enema, states they have gone from hard to soft now. She was able to eat a good lunch without any nausea, states she feels much better.

## 2014-07-12 NOTE — Progress Notes (Signed)
PROGRESS NOTE    [name] BMW:[id number] DOB: 15-Sep-1936 DOA: 07/11/2014 PCP: PROVIDER NOT IN SYSTEM  HPI/Brief narrative 78 year old [place] female with history of HTN, hypothyroid, resides in [place] and traveled to [place] area by car (5 hours) a week ago to visit family, presented to the ED with complaints of headache, associated elevated blood pressure, precordial chest pain, diaphoresis and nausea. She stated that her chest discomfort radiated to the left arm up to elbow. Her chest pain resolved after sublingual NTG x2. Initial blood pressure in the ED 188/95. EKG showed T-wave inversion diffusely. Chest x-ray without acute findings. CT head without acute findings. She was admitted and cardiology was consulted.   Assessment/Plan:  1. Chest pain: Suspicious for angina. No prior history of MI/CAD. CAD risk factors-hypertension and age. Cardiology consultation and followup appreciated. Discussed with cardiology-plan for nuclear stress test in a.m. Follow 2-D echo. Continue aspirin. Ruled out for MI by troponin x3 negative. Given history of recent travel and sedentary lifestyle, will also request d-dimer which is positive will need followup with CTA chest. 2. Constipation: Last BM 07/10/14. Passing flatus. Denies abdominal pain. Vomiting x2 last night. Trial of enema this morning and aggressive bowel regimen. TSH normal. 3. Uncontrolled essential hypertension: Improved. Continue amlodipine, HCTZ and when necessary IV hydralazine. 4. Hypokalemia: Replace and follow. Check magnesium. 5. Hypothyroid: Continue thyroxine. TSH normal.    Code Status: Full Family Communication: Discussed with son at bedside. Disposition Plan: Home when medically stable, possibly 9/7 pending negative nuclear stress test and other workup.   Consultants:  Cardiology  Procedures:  None  Antibiotics:  None   Subjective: No further chest pain, headache or dyspnea. Nonbloody emesis x2 yesterday.  Flatus +. No BM since 9/4.  Objective: Filed Vitals:   07/11/14 1447 07/11/14 2053 07/12/14 0007 07/12/14 0555  BP: 170/82 179/93 117/67 127/83  Pulse: 57 68 70 74  Temp: 97.6 F (36.4 C) 97.9 F (36.6 C)  98.3 F (36.8 C)  TempSrc: Oral Oral  Oral  Resp: 18 18  18   Height: 5\' 2"  (1.575 m)     Weight: 74.2 kg (163 lb 9.3 oz)     SpO2: 100% 100%  95%    Intake/Output Summary (Last 24 hours) at 07/12/14 0848 Last data filed at 07/11/14 2200  Gross per 24 hour  Intake    240 ml  Output    300 ml  Net    -60 ml   Filed Weights   07/11/14 1008 07/11/14 1447  Weight: 68.04 kg (150 lb) 74.2 kg (163 lb 9.3 oz)     Exam:  General exam: Moderately built and nourished pleasant elderly female lying comfortably in bed. Respiratory system: Clear. No increased work of breathing. Cardiovascular system: S1 & S2 heard, RRR. No JVD, murmurs, gallops, clicks or pedal edema. Gastrointestinal system: Abdomen is nondistended, soft and nontender. Normal bowel sounds heard. Central nervous system: Alert and oriented. No focal neurological deficits. Extremities: Symmetric 5 x 5 power.   Data Reviewed: Basic Metabolic Panel:  Recent Labs Lab 07/11/14 1012 07/12/14 0449  NA 141 142  K 4.1 3.2*  CL 97 96  CO2 29 32  GLUCOSE 121* 109*  BUN 23 22  CREATININE 0.94 1.12*  CALCIUM 9.8 9.4   Liver Function Tests: No results found for this basename: AST, ALT, ALKPHOS, BILITOT, PROT, ALBUMIN,  in the last 168 hours No results found for this basename: LIPASE, AMYLASE,  in the last 168 hours No results found for  this basename: AMMONIA,  in the last 168 hours CBC:  Recent Labs Lab 07/11/14 1012  WBC 8.8  NEUTROABS 4.1  HGB 12.2  HCT 37.8  MCV 81.5  PLT PLATELET CLUMPS NOTED ON SMEAR, COUNT APPEARS INCREASED   Cardiac Enzymes:  Recent Labs Lab 07/11/14 1012 07/11/14 1557 07/11/14 2150  TROPONINI <0.30 <0.30 <0.30   BNP (last 3 results)  Recent Labs  07/11/14 1011  PROBNP  95.3   CBG: No results found for this basename: GLUCAP,  in the last 168 hours  No results found for this or any previous visit (from the past 240 hour(s)).     Studies: Dg Chest 2 View  07/11/2014   CLINICAL DATA:  Short of breath  EXAM: CHEST  2 VIEW  COMPARISON:  11/09/2005  FINDINGS: Chronic airspace disease in the right middle lobe. Heart is mildly enlarged. Pulmonary vascularity is within normal limits. Minimal linear atelectasis for scar at the left base. Aorta is tortuous.  IMPRESSION: No active cardiopulmonary disease.  Chronic changes.   Electronically Signed   By: Maryclare Bean M.D.   On: 07/11/2014 11:02   Ct Head Wo Contrast  07/11/2014   CLINICAL DATA:  Headache, hypertensive episode  EXAM: CT HEAD WITHOUT CONTRAST  TECHNIQUE: Contiguous axial images were obtained from the base of the skull through the vertex without intravenous contrast.  COMPARISON:  None.  FINDINGS: No acute hemorrhage, infarct, or mass lesion is identified. No midline shift. Ventricles are normal in size. Orbits and paranasal sinuses are unremarkable. No skull fracture.  IMPRESSION: No acute intracranial findings.   Electronically Signed   By: Conchita Paris M.D.   On: 07/11/2014 11:07   Dg Abd 2 Views  07/11/2014   CLINICAL DATA:  Nausea and vomiting today  EXAM: ABDOMEN - 2 VIEW  COMPARISON:  05/23/2004; chest radiograph - 07/11/2014  FINDINGS: Moderate colonic stool burden. Paucity of bowel gas without definite evidence of obstruction. No pneumoperitoneum, pneumatosis or portal venous gas.  Limited visualization of the lower thorax demonstrates trace bilateral effusions with associated bibasilar opacities. No acute osseus abnormalities. Regional soft tissues appear normal. No definite abnormal intra-abdominal calcifications.  IMPRESSION: Moderate colonic stool burden without evidence of obstruction.   Electronically Signed   By: Sandi Mariscal M.D.   On: 07/11/2014 18:51        Scheduled Meds: . amLODipine  5 mg  Oral Daily  . aspirin EC  325 mg Oral Daily  . enoxaparin (LOVENOX) injection  40 mg Subcutaneous Q24H  . hydrochlorothiazide  25 mg Oral q morning - 10a  . levothyroxine  100 mcg Oral QAC breakfast  . milk and molasses  1 enema Rectal Once  . polyethylene glycol  17 g Oral Daily  . potassium chloride  40 mEq Oral Once  . senna  2 tablet Oral Daily   Continuous Infusions:   Active Problems:   Chest pain   Hypertensive urgency   Hypothyroidism   Constipation   Vomiting    Time spent: 40 minutes    Tyquan Carmickle, MD, FACP, FHM. [facility] Pager [phone]  If 7PM-7AM, please contact night-coverage [email or web address] Password [email or web address] 07/12/2014, 8:48 AM    LOS: 1 day

## 2014-07-12 NOTE — Progress Notes (Signed)
Pt was up to bathroom, came back to bed and c/o "a pulling" in her rt chest wall, Vital signs stable, pain relieved with PRN morphine. Will make MD aware.

## 2014-07-12 NOTE — Progress Notes (Signed)
Patient ID: [name], female   DOB: 04-13-1936, 78 y.o.   MRN: [id number]    SUBJECTIVE:  Patient feels a little better this morning. She continues to have significant constipation. She did vomit once during the night. She's not having any chest pain.   Filed Vitals:   07/11/14 1447 07/11/14 2053 07/12/14 0007 07/12/14 0555  BP: 170/82 179/93 117/67 127/83  Pulse: 57 68 70 74  Temp: 97.6 F (36.4 C) 97.9 F (36.6 C)  98.3 F (36.8 C)  TempSrc: Oral Oral  Oral  Resp: 18 18  18   Height: 5\' 2"  (1.575 m)     Weight: 163 lb 9.3 oz (74.2 kg)     SpO2: 100% 100%  95%     Intake/Output Summary (Last 24 hours) at 07/12/14 0748 Last data filed at 07/11/14 2200  Gross per 24 hour  Intake    240 ml  Output    300 ml  Net    -60 ml    LABS: Basic Metabolic Panel:  Recent Labs  07/11/14 1012 07/12/14 0449  NA 141 142  K 4.1 3.2*  CL 97 96  CO2 29 32  GLUCOSE 121* 109*  BUN 23 22  CREATININE 0.94 1.12*  CALCIUM 9.8 9.4   Liver Function Tests: No results found for this basename: AST, ALT, ALKPHOS, BILITOT, PROT, ALBUMIN,  in the last 72 hours No results found for this basename: LIPASE, AMYLASE,  in the last 72 hours CBC:  Recent Labs  07/11/14 1012  WBC 8.8  NEUTROABS 4.1  HGB 12.2  HCT 37.8  MCV 81.5  PLT PLATELET CLUMPS NOTED ON SMEAR, COUNT APPEARS INCREASED   Cardiac Enzymes:  Recent Labs  07/11/14 1012 07/11/14 1557 07/11/14 2150  TROPONINI <0.30 <0.30 <0.30   BNP: No components found with this basename: POCBNP,  D-Dimer: No results found for this basename: DDIMER,  in the last 72 hours Hemoglobin A1C: No results found for this basename: HGBA1C,  in the last 72 hours Fasting Lipid Panel:  Recent Labs  07/11/14 1557  CHOL 216*  HDL 49  LDLCALC 140*  TRIG 134  CHOLHDL 4.4   Thyroid Function Tests:  Recent Labs  07/11/14 1557  TSH 2.660    RADIOLOGY: Dg Chest 2 View  07/11/2014   CLINICAL DATA:  Short of breath  EXAM: CHEST  2 VIEW   COMPARISON:  11/09/2005  FINDINGS: Chronic airspace disease in the right middle lobe. Heart is mildly enlarged. Pulmonary vascularity is within normal limits. Minimal linear atelectasis for scar at the left base. Aorta is tortuous.  IMPRESSION: No active cardiopulmonary disease.  Chronic changes.   Electronically Signed   By: Maryclare Bean M.D.   On: 07/11/2014 11:02   Ct Head Wo Contrast  07/11/2014   CLINICAL DATA:  Headache, hypertensive episode  EXAM: CT HEAD WITHOUT CONTRAST  TECHNIQUE: Contiguous axial images were obtained from the base of the skull through the vertex without intravenous contrast.  COMPARISON:  None.  FINDINGS: No acute hemorrhage, infarct, or mass lesion is identified. No midline shift. Ventricles are normal in size. Orbits and paranasal sinuses are unremarkable. No skull fracture.  IMPRESSION: No acute intracranial findings.   Electronically Signed   By: Conchita Paris M.D.   On: 07/11/2014 11:07   Dg Abd 2 Views  07/11/2014   CLINICAL DATA:  Nausea and vomiting today  EXAM: ABDOMEN - 2 VIEW  COMPARISON:  05/23/2004; chest radiograph - 07/11/2014  FINDINGS: Moderate colonic stool burden.  Paucity of bowel gas without definite evidence of obstruction. No pneumoperitoneum, pneumatosis or portal venous gas.  Limited visualization of the lower thorax demonstrates trace bilateral effusions with associated bibasilar opacities. No acute osseus abnormalities. Regional soft tissues appear normal. No definite abnormal intra-abdominal calcifications.  IMPRESSION: Moderate colonic stool burden without evidence of obstruction.   Electronically Signed   By: Sandi Mariscal M.D.   On: 07/11/2014 18:51    PHYSICAL EXAM  patient feels a little better this morning. She is oriented to person time and place. Her family members in the room. Head is atraumatic. Sclera and conjunctiva are normal. There is no jugulovenous distention. Lungs are clear. Respiratory effort is nonlabored. Cardiac exam reveals S1 and S2.  There is no edema.   TELEMETRY:  I have reviewed telemetry today July 12, 2014. There is normal sinus rhythm.   ASSESSMENT AND PLAN:    Chest pain   At this point there is no definite proof of cardiac ischemia as part of this admission. Troponins are all normal. It seems that the major symptom is her GI symptoms with her constipation and nausea and also her hypertension. Plan to await her 2-D echo. If she has normal left ventricular function and feels better after resolution of her GI symptoms, she will not need any further workup in the hospital. If she has had normal LV function, we will make further decisions about the next tests that are necessary.    Hypertensive urgency     Continue to treat her blood pressure.    Hypothyroidism    Constipation   Vomiting      As of this morning this is her major symptom.   Dola Argyle 07/12/2014 7:48 AM

## 2014-07-12 NOTE — Progress Notes (Signed)
BP still high at hs last evening, text sent to on call NP T.[employer] & prn Apresoline ordered & given along with MIralax. BP was down in 2hrs to 117/67 & BP still good this am, can review on Doc Flowsheets. No BM's yet from the laxative given last pm for xray results of Burdened Colon. Pt denies any Chest Pain this 12 hr shift,& no further N/V tonite either.

## 2014-07-12 NOTE — Progress Notes (Signed)
Patient ID: [name], female   DOB: 06-22-1936, 78 y.o.   MRN: [id number]  I have spoken by telephone with Dr.Hongali. He is concerned about her presenting symptoms. She travels in and out of town. I agree that it will be most appropriate to complete a more aggressive evaluation of her chest discomfort while she is in the hospital. I have ordered a nuclear stress study for tomorrow. Echo is to be done today.  Daryel November, MD

## 2014-07-13 ENCOUNTER — Ambulatory Visit (HOSPITAL_COMMUNITY)
Admit: 2014-07-13 | Discharge: 2014-07-13 | Disposition: A | Discharge status: Home / Self Care | Payer: Medicare Other | Attending: Cardiology | Admitting: Cardiology

## 2014-07-13 DIAGNOSIS — K5900 Constipation, unspecified: Secondary | ICD-10-CM | POA: Diagnosis not present

## 2014-07-13 DIAGNOSIS — R079 Chest pain, unspecified: Secondary | ICD-10-CM | POA: Diagnosis not present

## 2014-07-13 DIAGNOSIS — I10 Essential (primary) hypertension: Secondary | ICD-10-CM | POA: Diagnosis not present

## 2014-07-13 DIAGNOSIS — E876 Hypokalemia: Secondary | ICD-10-CM | POA: Diagnosis not present

## 2014-07-13 MED ORDER — TECHNETIUM TC 99M SESTAMIBI GENERIC - CARDIOLITE
30.0000 | Freq: Once | INTRAVENOUS | Status: AC | PRN
Start: 2014-07-13 — End: 2014-07-13
  Administered 2014-07-13: 30 via INTRAVENOUS

## 2014-07-13 MED ORDER — REGADENOSON 0.4 MG/5ML IV SOLN
0.4000 mg | Freq: Once | INTRAVENOUS | Status: AC
  Administered 2014-07-13: 0.4 mg via INTRAVENOUS

## 2014-07-13 MED ORDER — REGADENOSON 0.4 MG/5ML IV SOLN
INTRAVENOUS | Status: AC
  Filled 2014-07-13: qty 5

## 2014-07-13 MED ORDER — SENNA 8.6 MG PO TABS
2.0000 | ORAL_TABLET | Freq: Every day | ORAL | Status: AC | PRN
Start: 2014-07-13 — End: ?

## 2014-07-13 MED ORDER — ASPIRIN EC 81 MG PO TBEC: 81.0000 mg | DELAYED_RELEASE_TABLET | Freq: Every day | ORAL | Status: DC

## 2014-07-13 MED ORDER — AMLODIPINE BESYLATE 5 MG PO TABS: 5.0000 mg | ORAL_TABLET | Freq: Every day | ORAL | Status: DC

## 2014-07-13 MED ORDER — PANTOPRAZOLE SODIUM 40 MG PO TBEC: 40.0000 mg | DELAYED_RELEASE_TABLET | Freq: Every day | ORAL | Status: DC

## 2014-07-13 MED ORDER — TECHNETIUM TC 99M SESTAMIBI GENERIC - CARDIOLITE
10.0000 | Freq: Once | INTRAVENOUS | Status: AC | PRN
  Administered 2014-07-13: 10 via INTRAVENOUS

## 2014-07-13 MED ORDER — POLYETHYLENE GLYCOL 3350 17 G PO PACK
17.0000 g | PACK | Freq: Every day | ORAL | Status: DC
Start: 2014-07-13 — End: 2024-01-18

## 2014-07-13 NOTE — Discharge Summary (Signed)
Physician Discharge Summary  [name] PIR:[id number] DOB: July 06, 1936 DOA: 07/11/2014  PCP: PROVIDER NOT IN SYSTEM  Admit date: 07/11/2014 Discharge date: 07/13/2014  Time spent: Less than 30 minutes  Recommendations for Outpatient Follow-up:  1. Primary MD or Urgent Care in 1 week with repeat labs (CBC & BMP). Monitor for hypokalemia and for hypertension management.  Discharge Diagnoses:  Active Problems:   Chest pain   Hypertensive urgency   Hypothyroidism   Constipation   Vomiting   Essential hypertension, benign   Hypokalemia   Discharge Condition: Improved & Stable  Diet recommendation: Heart healthy diet  Filed Weights   07/11/14 1008 07/11/14 1447  Weight: 68.04 kg (150 lb) 74.2 kg (163 lb 9.3 oz)    History of present illness:  78 year old [place] female with history of HTN, hypothyroid, resides in [place] and traveled to [place] area by car (5 hours) a week ago to visit family, presented to the ED with complaints of headache, associated elevated blood pressure, precordial chest pain, diaphoresis and nausea. She stated that her chest discomfort radiated to the left arm up to elbow. Her chest pain resolved after sublingual NTG x2. Initial blood pressure in the ED 188/95. EKG showed T-wave inversion diffusely. Chest x-ray without acute findings. CT head without acute findings. She was admitted and cardiology was consulted.  Hospital Course:   1. Chest pain: Was suspicious for angina. No prior history of MI/CAD. CAD risk factors-hypertension and age. Ruled out for MI by troponin x3 negative. Given history of recent travel and sedentary lifestyle, d-dimer was requested which was positive. This was followed by CTA chest which was negative for PE. As per family, patient takes aspirin 81 mg daily at home. Cardiology was consulted and performed a nuclear stress test today. She did have some chest pressure during the stress test and TWI in inferior leads and V4-7. Final report  states no evidence of prior infarction or pharmacologic-induced ischemia. Discussed with cardiology PA Almyra Deforest who cleared her for discharge home. She had been seen by Dr. Ron Parker this morning prior to stress test and he had indicated that she could be discharged home if stress test did not show significant ischemia. Etiology of her chest pain may be GI in origin-GERD & constipation. 2. Constipation:TSH normal. Patient had 2 episodes of nonbloody emesis on night of admission. Abdominal x-ray showed moderate stool burden. She was given an enema and started on bowel regimen. She had multiple BMs. No further nausea, vomiting or reported abdominal pain. She will be discharged on MiraLAX and when necessary senna. 3. Uncontrolled essential hypertension: Continue HCTZ. Amlodipine was newly added for uncontrolled blood pressure. Improved 4. Hypokalemia: Replace replaced. Magnesium 1.8. Outpatient followup. 5. Hypothyroid: Continue thyroxine. TSH normal. 6. GERD/? Esophageal dysmotility: Seen on CT chest. PPI. May consider outpatient GI evaluation-discussed with patient. 7. Pulmonary artery hypertension: Seen on CT chest. Unclear etiology. May consider outpatient cardiology evaluation-discussed with patient and she verbalized understanding   Consultations:  Cardiology  Procedures:  Lexiscan Stress test 07/13/14: IMPRESSION:  1. No scintigraphic evidence of prior infarction or  pharmacologically induced ischemia.  2. Normal left ventricular wall motion.  3. Left ventricular ejection fraction 86%  4. Low-risk stress test findings*.   2-D echo 07/12/14: Study Conclusions  - Left ventricle: The cavity size was normal. Wall thickness was increased in a pattern of mild LVH. The estimated ejection fraction was 60%. Wall motion was normal; there were no regional wall motion abnormalities. Doppler parameters are consistent with abnormal  left ventricular relaxation (grade 1 diastolic dysfunction). - Aortic  valve: Sclerosis without stenosis. There was no significant regurgitation. Valve area (Vmax): 1.92 cm^2. - Aorta: Mild dilitation of the ascending aorta. - Mitral valve: There was mild regurgitation. - Left atrium: The atrium was mildly to moderately dilated. - Right ventricle: The cavity size was normal. Systolic function was normal. - Right atrium: The atrium was mildly dilated.   Discharge Exam:  Complaints:  No further chest pain reported. Patient states that she had 3-4 large BMs since enema yesterday. No nausea or vomiting.  Filed Vitals:   07/12/14 1816 07/12/14 2148 07/13/14 0551 07/13/14 1320  BP: 113/69 117/71 124/83 136/83  Pulse: 76 81 85 74  Temp:  98.3 F (36.8 C) 99.9 F (37.7 C) 97.7 F (36.5 C)  TempSrc:  Oral Oral Oral  Resp:  18 18 16   Height:      Weight:      SpO2:  94% 97% 95%   General exam: Moderately built and nourished pleasant elderly female lying comfortably in bed.  Respiratory system: Clear. No increased work of breathing.  Cardiovascular system: S1 & S2 heard, RRR. No JVD, murmurs, gallops, clicks or pedal edema. Telemetry: Sinus rhythm Gastrointestinal system: Abdomen is nondistended, soft and nontender. Normal bowel sounds heard.  Central nervous system: Alert and oriented. No focal neurological deficits.  Extremities: Symmetric 5 x 5 power.   Discharge Instructions      Discharge Instructions   Call MD for:  severe uncontrolled pain    Complete by:  As directed      Diet - low sodium heart healthy    Complete by:  As directed      Increase activity slowly    Complete by:  As directed             Medication List         acetaminophen 325 MG tablet  Commonly known as:  TYLENOL  Take 650 mg by mouth every 6 (six) hours as needed for moderate pain or headache.     amLODipine 5 MG tablet  Commonly known as:  NORVASC  Take 1 tablet (5 mg total) by mouth daily.     aspirin EC 81 MG tablet  Take 1 tablet (81 mg total) by mouth  daily.     hydrochlorothiazide 25 MG tablet  Commonly known as:  HYDRODIURIL  Take 25 mg by mouth every morning.     levothyroxine 100 MCG tablet  Commonly known as:  SYNTHROID, LEVOTHROID  Take 100 mcg by mouth daily before breakfast.     pantoprazole 40 MG tablet  Commonly known as:  PROTONIX  Take 1 tablet (40 mg total) by mouth daily.     polyethylene glycol packet  Commonly known as:  MIRALAX / GLYCOLAX  Take 17 g by mouth daily.     senna 8.6 MG Tabs tablet  Commonly known as:  SENOKOT  Take 2 tablets (17.2 mg total) by mouth daily as needed for moderate constipation.       Follow-up Information   Follow up with Primary MD or Urgent Care. Schedule an appointment as soon as possible for a visit in 1 week. (To be seen for repeat labs (CBC & BMP). Monitor for hypokalemia. BP management.)       The results of significant diagnostics from this hospitalization (including imaging, microbiology, ancillary and laboratory) are listed below for reference.    Significant Diagnostic Studies: Dg Chest 2 View  07/11/2014  CLINICAL DATA:  Short of breath  EXAM: CHEST  2 VIEW  COMPARISON:  11/09/2005  FINDINGS: Chronic airspace disease in the right middle lobe. Heart is mildly enlarged. Pulmonary vascularity is within normal limits. Minimal linear atelectasis for scar at the left base. Aorta is tortuous.  IMPRESSION: No active cardiopulmonary disease.  Chronic changes.   Electronically Signed   By: Maryclare Bean M.D.   On: 07/11/2014 11:02   Ct Head Wo Contrast  07/11/2014   CLINICAL DATA:  Headache, hypertensive episode  EXAM: CT HEAD WITHOUT CONTRAST  TECHNIQUE: Contiguous axial images were obtained from the base of the skull through the vertex without intravenous contrast.  COMPARISON:  None.  FINDINGS: No acute hemorrhage, infarct, or mass lesion is identified. No midline shift. Ventricles are normal in size. Orbits and paranasal sinuses are unremarkable. No skull fracture.  IMPRESSION: No  acute intracranial findings.   Electronically Signed   By: Conchita Paris M.D.   On: 07/11/2014 11:07   Ct Angio Chest Pe W/cm &/or Wo Cm  07/12/2014   CLINICAL DATA:  Headache.  Chest pain.  Dyspnea.  Elevated D-dimer.  EXAM: CT ANGIOGRAPHY CHEST WITH CONTRAST  TECHNIQUE: Multidetector CT imaging of the chest was performed using the standard protocol during bolus administration of intravenous contrast. Multiplanar CT image reconstructions and MIPs were obtained to evaluate the vascular anatomy.  CONTRAST:  160mL OMNIPAQUE IOHEXOL 350 MG/ML SOLN  COMPARISON:  Plain film 1 day prior.  Chest CT 12/15/2007  FINDINGS: Lungs/Pleura: Bibasilar volume loss with subsegmental atelectasis. No pleural fluid.  Heart/Mediastinum: The quality of this examination for evaluation of pulmonary embolism is moderate to good. Large amount of contrast is identified within the SVC. No evidence of pulmonary embolism.  Pulmonary arteries are enlarged, with the outflow tract measuring 4.2 cm and the right pulmonary artery measuring 3.4 cm.  Mild cardiomegaly without pericardial effusion. Ascending aorta upper normal in size, 3.7 cm. Atherosclerosis within the descending aorta. No mediastinal or hilar adenopathy. Small hiatal hernia. Fluid level in the esophagus.  Upper Abdomen: Reflux contrast into the IVC and hepatic veins. Subcentimeter low-density splenic lesions which were present on the prior and are of no significance.  Normal imaged portions of the stomach, adrenal glands, kidneys. The intrahepatic ducts are borderline prominent, but this is similar to the prior exam and likely related to prior hysterectomy. A fat containing ventral abdominal wall hernia is incompletely imaged.  Bones/Musculoskeletal:  No acute osseous abnormality.  Review of the MIP images confirms the above findings.  IMPRESSION: 1. Moderate to good quality evaluation for pulmonary embolism, without embolism identified. 2. Pulmonary artery enlargement suggests  pulmonary arterial hypertension. 3. No explanation for patient's symptoms. 4. Esophageal air fluid level suggests dysmotility or gastroesophageal reflux. 5. Elevated right heart pressures, as evidenced by contrast reflux into the IVC and hepatic veins. 6. Incompletely imaged fat containing ventral abdominal wall hernia.   Electronically Signed   By: Abigail Miyamoto M.D.   On: 07/12/2014 13:08   Nm Myocar Multi W/spect W/wall Motion / Ef  07/13/2014   CLINICAL DATA:  Chest pain.  History of hypertension.  EXAM: MYOCARDIAL IMAGING WITH SPECT (REST AND PHARMACOLOGIC-STRESS)  GATED LEFT VENTRICULAR WALL MOTION STUDY  LEFT VENTRICULAR EJECTION FRACTION  TECHNIQUE: Standard myocardial SPECT imaging was performed after resting intravenous injection of 10 mCi Tc-14m sestamibi. Subsequently, intravenous infusion of Lexiscan was performed under the supervision of the Cardiology staff. At peak effect of the drug, 30 mCi Tc-33m sestamibi was injected  intravenously and standard myocardial SPECT imaging was performed. Quantitative gated imaging was also performed to evaluate left ventricular wall motion, and estimate left ventricular ejection fraction.  COMPARISON:  Chest CTA - 07/12/2014; chest radiograph - 07/21/2014  FINDINGS: Raw images: Review of the raw images demonstrates moderate breast attenuation artifact. There is no significant patient motion artifact.  Perfusion: There is a very minimal amount of attenuation involving the inferior and lateral walls of the left ventricle which resolves on the provided stress images. No scintigraphic evidence of prior infarction or pharmacologically induced ischemia.  Wall Motion: Normal left ventricular wall motion. No left ventricular dilation.  Left Ventricular Ejection Fraction: 86 %  End diastolic volume 57 ml  End systolic volume 8 ml  IMPRESSION: 1. No scintigraphic evidence of prior infarction or pharmacologically induced ischemia.  2. Normal left ventricular wall motion.  3.  Left ventricular ejection fraction 86%  4. Low-risk stress test findings*.  *2012 Appropriate Use Criteria for Coronary Revascularization Focused Update: J Am Coll Cardiol. 2831;51(7):616-073. http://content.airportbarriers.com.aspx?articleid=1201161   Electronically Signed   By: Sandi Mariscal M.D.   On: 07/13/2014 12:31   Dg Abd 2 Views  07/11/2014   CLINICAL DATA:  Nausea and vomiting today  EXAM: ABDOMEN - 2 VIEW  COMPARISON:  05/23/2004; chest radiograph - 07/11/2014  FINDINGS: Moderate colonic stool burden. Paucity of bowel gas without definite evidence of obstruction. No pneumoperitoneum, pneumatosis or portal venous gas.  Limited visualization of the lower thorax demonstrates trace bilateral effusions with associated bibasilar opacities. No acute osseus abnormalities. Regional soft tissues appear normal. No definite abnormal intra-abdominal calcifications.  IMPRESSION: Moderate colonic stool burden without evidence of obstruction.   Electronically Signed   By: Sandi Mariscal M.D.   On: 07/11/2014 18:51    Microbiology: No results found for this or any previous visit (from the past 240 hour(s)).   Labs: Basic Metabolic Panel:  Recent Labs Lab 07/11/14 1012 07/12/14 0449 07/12/14 0928  NA 141 142  --   K 4.1 3.2*  --   CL 97 96  --   CO2 29 32  --   GLUCOSE 121* 109*  --   BUN 23 22  --   CREATININE 0.94 1.12*  --   CALCIUM 9.8 9.4  --   MG  --   --  1.8   Liver Function Tests: No results found for this basename: AST, ALT, ALKPHOS, BILITOT, PROT, ALBUMIN,  in the last 168 hours No results found for this basename: LIPASE, AMYLASE,  in the last 168 hours No results found for this basename: AMMONIA,  in the last 168 hours CBC:  Recent Labs Lab 07/11/14 1012  WBC 8.8  NEUTROABS 4.1  HGB 12.2  HCT 37.8  MCV 81.5  PLT PLATELET CLUMPS NOTED ON SMEAR, COUNT APPEARS INCREASED   Cardiac Enzymes:  Recent Labs Lab 07/11/14 1012 07/11/14 1557 07/11/14 2150  TROPONINI <0.30  <0.30 <0.30   BNP: BNP (last 3 results)  Recent Labs  07/11/14 1011  PROBNP 95.3   CBG: No results found for this basename: GLUCAP,  in the last 168 hours  Additional labs: 1. Fasting lipids: Cholesterol 216, triglycerides 134, HDL 49, LDL 140 and VLDL 27 2. D-dimer: 1.48 3. TSH: 2.660   Signed:  Vernell Leep, MD, FACP, FHM. [facility] Pager [phone]  If 7PM-7AM, please contact night-coverage [email or web address] Password [email or web address] 07/13/2014, 3:10 PM

## 2014-07-13 NOTE — Progress Notes (Signed)
Patient ID: [name], female   DOB: Feb 15, 1936, 78 y.o.   MRN: [id number]    SUBJECTIVE: Patient has finally had several good bowel movements and she feels much better. 2-D echo yesterday showed an ejection fraction of 60% with no focal wall motion abnormalities. Her ascending aorta is slightly dilated.   Filed Vitals:   07/12/14 1750 07/12/14 1816 07/12/14 2148 07/13/14 0551  BP: 97/59 113/69 117/71 124/83  Pulse: 76 76 81 85  Temp:   98.3 F (36.8 C) 99.9 F (37.7 C)  TempSrc:   Oral Oral  Resp:   18 18  Height:      Weight:      SpO2:   94% 97%     Intake/Output Summary (Last 24 hours) at 07/13/14 0737 Last data filed at 07/13/14 0626  Gross per 24 hour  Intake    843 ml  Output      0 ml  Net    843 ml    LABS: Basic Metabolic Panel:  Recent Labs  07/11/14 1012 07/12/14 0449 07/12/14 0928  NA 141 142  --   K 4.1 3.2*  --   CL 97 96  --   CO2 29 32  --   GLUCOSE 121* 109*  --   BUN 23 22  --   CREATININE 0.94 1.12*  --   CALCIUM 9.8 9.4  --   MG  --   --  1.8   Liver Function Tests: No results found for this basename: AST, ALT, ALKPHOS, BILITOT, PROT, ALBUMIN,  in the last 72 hours No results found for this basename: LIPASE, AMYLASE,  in the last 72 hours CBC:  Recent Labs  07/11/14 1012  WBC 8.8  NEUTROABS 4.1  HGB 12.2  HCT 37.8  MCV 81.5  PLT PLATELET CLUMPS NOTED ON SMEAR, COUNT APPEARS INCREASED   Cardiac Enzymes:  Recent Labs  07/11/14 1012 07/11/14 1557 07/11/14 2150  TROPONINI <0.30 <0.30 <0.30   BNP: No components found with this basename: POCBNP,  D-Dimer:  Recent Labs  07/12/14 0928  DDIMER 1.48*   Hemoglobin A1C: No results found for this basename: HGBA1C,  in the last 72 hours Fasting Lipid Panel:  Recent Labs  07/11/14 1557  CHOL 216*  HDL 49  LDLCALC 140*  TRIG 134  CHOLHDL 4.4   Thyroid Function Tests:  Recent Labs  07/11/14 1557  TSH 2.660    RADIOLOGY: Dg Chest 2 View  07/11/2014   CLINICAL  DATA:  Short of breath  EXAM: CHEST  2 VIEW  COMPARISON:  11/09/2005  FINDINGS: Chronic airspace disease in the right middle lobe. Heart is mildly enlarged. Pulmonary vascularity is within normal limits. Minimal linear atelectasis for scar at the left base. Aorta is tortuous.  IMPRESSION: No active cardiopulmonary disease.  Chronic changes.   Electronically Signed   By: Maryclare Bean M.D.   On: 07/11/2014 11:02   Ct Head Wo Contrast  07/11/2014   CLINICAL DATA:  Headache, hypertensive episode  EXAM: CT HEAD WITHOUT CONTRAST  TECHNIQUE: Contiguous axial images were obtained from the base of the skull through the vertex without intravenous contrast.  COMPARISON:  None.  FINDINGS: No acute hemorrhage, infarct, or mass lesion is identified. No midline shift. Ventricles are normal in size. Orbits and paranasal sinuses are unremarkable. No skull fracture.  IMPRESSION: No acute intracranial findings.   Electronically Signed   By: Conchita Paris M.D.   On: 07/11/2014 11:07   Ct Angio Chest Pe  W/cm &/or Wo Cm  07/12/2014   CLINICAL DATA:  Headache.  Chest pain.  Dyspnea.  Elevated D-dimer.  EXAM: CT ANGIOGRAPHY CHEST WITH CONTRAST  TECHNIQUE: Multidetector CT imaging of the chest was performed using the standard protocol during bolus administration of intravenous contrast. Multiplanar CT image reconstructions and MIPs were obtained to evaluate the vascular anatomy.  CONTRAST:  144mL OMNIPAQUE IOHEXOL 350 MG/ML SOLN  COMPARISON:  Plain film 1 day prior.  Chest CT 12/15/2007  FINDINGS: Lungs/Pleura: Bibasilar volume loss with subsegmental atelectasis. No pleural fluid.  Heart/Mediastinum: The quality of this examination for evaluation of pulmonary embolism is moderate to good. Large amount of contrast is identified within the SVC. No evidence of pulmonary embolism.  Pulmonary arteries are enlarged, with the outflow tract measuring 4.2 cm and the right pulmonary artery measuring 3.4 cm.  Mild cardiomegaly without pericardial  effusion. Ascending aorta upper normal in size, 3.7 cm. Atherosclerosis within the descending aorta. No mediastinal or hilar adenopathy. Small hiatal hernia. Fluid level in the esophagus.  Upper Abdomen: Reflux contrast into the IVC and hepatic veins. Subcentimeter low-density splenic lesions which were present on the prior and are of no significance.  Normal imaged portions of the stomach, adrenal glands, kidneys. The intrahepatic ducts are borderline prominent, but this is similar to the prior exam and likely related to prior hysterectomy. A fat containing ventral abdominal wall hernia is incompletely imaged.  Bones/Musculoskeletal:  No acute osseous abnormality.  Review of the MIP images confirms the above findings.  IMPRESSION: 1. Moderate to good quality evaluation for pulmonary embolism, without embolism identified. 2. Pulmonary artery enlargement suggests pulmonary arterial hypertension. 3. No explanation for patient's symptoms. 4. Esophageal air fluid level suggests dysmotility or gastroesophageal reflux. 5. Elevated right heart pressures, as evidenced by contrast reflux into the IVC and hepatic veins. 6. Incompletely imaged fat containing ventral abdominal wall hernia.   Electronically Signed   By: Abigail Miyamoto M.D.   On: 07/12/2014 13:08   Dg Abd 2 Views  07/11/2014   CLINICAL DATA:  Nausea and vomiting today  EXAM: ABDOMEN - 2 VIEW  COMPARISON:  05/23/2004; chest radiograph - 07/11/2014  FINDINGS: Moderate colonic stool burden. Paucity of bowel gas without definite evidence of obstruction. No pneumoperitoneum, pneumatosis or portal venous gas.  Limited visualization of the lower thorax demonstrates trace bilateral effusions with associated bibasilar opacities. No acute osseus abnormalities. Regional soft tissues appear normal. No definite abnormal intra-abdominal calcifications.  IMPRESSION: Moderate colonic stool burden without evidence of obstruction.   Electronically Signed   By: Sandi Mariscal M.D.    On: 07/11/2014 18:51    TELEMETRY:  I have reviewed telemetry today July 13, 2014. There is normal sinus rhythm.   ASSESSMENT AND PLAN:    Chest pain     The patient is having a nuclear stress test today. If it does not show significant ischemia, she can be discharged home by her primary team. I spoke with Dr. Algis Liming about this in person this morning. If she were to have significant ischemia, we would need to proceed with cardiac catheterization.    Hypertensive urgency   Blood pressure stable.        Constipation   Vomiting     She is much better after bowel movements.   Dola Argyle 07/13/2014 7:37 AM

## 2014-07-13 NOTE — Progress Notes (Signed)
Mrs. Boston Catarino stress test completed. She did have some chest pressure during lexiscan stress test and TWI in inferior lead and V4-V7. Pending final result.   Hilbert Corrigan PA Pager: [phone]

## 2014-07-13 NOTE — Care Management Note (Signed)
    Page 1 of 1   07/13/2014     12:34:45 PM CARE MANAGEMENT NOTE 07/13/2014  Patient:  [name]   Account Number:  [id number]  Date Initiated:  07/13/2014  Documentation initiated by:  Dessa Phi  Subjective/Objective Assessment:   78 Y/O F ADMITTED W/CHEST PAIN.     Action/Plan:   FROM HOME.   Anticipated DC Date:  07/13/2014   Anticipated DC Plan:  [place]  CM consult      Choice offered to / List presented to:             Status of service:  In process, will continue to follow Medicare Important Message given?   (If response is "NO", the following Medicare IM given date fields will be blank) Date Medicare IM given:   Medicare IM given by:   Date Additional Medicare IM given:   Additional Medicare IM given by:    Discharge Disposition:    Per UR Regulation:  Reviewed for med. necessity/level of care/duration of stay  If discussed at Long Length of Stay Meetings, dates discussed:    Comments:  07/13/14 Thorne Wirz RN,BSN NCM [phone] AWAIT STRESS TEST RESULTS.NO ANTICIPATED D/C NEEDS.

## 2014-07-14 LAB — PATHOLOGIST SMEAR REVIEW

## 2014-08-06 ENCOUNTER — Emergency Department (INDEPENDENT_AMBULATORY_CARE_PROVIDER_SITE_OTHER)
Admission: EM | Admit: 2014-08-06 | Discharge: 2014-08-06 | Disposition: A | Discharge status: Home / Self Care | Payer: Medicare Other | Source: Home / Self Care | Attending: Emergency Medicine | Admitting: Emergency Medicine

## 2014-08-06 ENCOUNTER — Encounter (HOSPITAL_COMMUNITY): Payer: Self-pay | Admitting: Emergency Medicine

## 2014-08-06 DIAGNOSIS — I10 Essential (primary) hypertension: Secondary | ICD-10-CM | POA: Diagnosis not present

## 2014-08-06 DIAGNOSIS — E876 Hypokalemia: Secondary | ICD-10-CM

## 2014-08-06 DIAGNOSIS — K219 Gastro-esophageal reflux disease without esophagitis: Secondary | ICD-10-CM | POA: Diagnosis not present

## 2014-08-06 DIAGNOSIS — K5900 Constipation, unspecified: Secondary | ICD-10-CM | POA: Diagnosis not present

## 2014-08-06 DIAGNOSIS — E039 Hypothyroidism, unspecified: Secondary | ICD-10-CM

## 2014-08-06 LAB — POCT I-STAT, CHEM 8
BUN: 20 mg/dL (ref 6–23)
CREATININE: 1 mg/dL (ref 0.50–1.10)
Calcium, Ion: 1.18 mmol/L (ref 1.13–1.30)
Chloride: 98 mEq/L (ref 96–112)
GLUCOSE: 156 mg/dL — AB (ref 70–99)
HCT: 43 % (ref 36.0–46.0)
HEMOGLOBIN: 14.6 g/dL (ref 12.0–15.0)
Potassium: 2.9 mEq/L — CL (ref 3.7–5.3)
Sodium: 140 mEq/L (ref 137–147)
TCO2: 33 mmol/L (ref 0–100)

## 2014-08-06 MED ORDER — BENZONATATE 200 MG PO CAPS: 200.0000 mg | ORAL_CAPSULE | Freq: Three times a day (TID) | ORAL | Status: DC | PRN

## 2014-08-06 MED ORDER — AMLODIPINE BESYLATE 5 MG PO TABS: 5.0000 mg | ORAL_TABLET | Freq: Every day | ORAL | Status: DC

## 2014-08-06 MED ORDER — PANTOPRAZOLE SODIUM 40 MG PO TBEC: 40.0000 mg | DELAYED_RELEASE_TABLET | Freq: Every day | ORAL | Status: DC

## 2014-08-06 MED ORDER — HYDROCHLOROTHIAZIDE 25 MG PO TABS: 25.0000 mg | ORAL_TABLET | Freq: Every day | ORAL | Status: DC

## 2014-08-06 MED ORDER — LEVOTHYROXINE SODIUM 100 MCG PO TABS: 100.0000 ug | ORAL_TABLET | Freq: Every day | ORAL | Status: AC

## 2014-08-06 MED ORDER — POTASSIUM CHLORIDE 20 MEQ PO PACK: 20.0000 meq | PACK | Freq: Two times a day (BID) | ORAL | Status: DC

## 2014-08-06 NOTE — ED Provider Notes (Signed)
Chief Complaint   Medication Refill   History of Present Illness   [name] is a 78 year old female who was hospitalized here one month ago because of elevated blood pressure and leg swelling. She was discharged after a couple days in the hospital on the following meds: Norvasc 5 mg daily, hydrochlorothiazide 25 mg daily, Protonix 40 mg a day, MiraLax, Senokot, and liver thyroxine 100 mcg per day. She's been doing well since then with no acute symptoms. The patient states she has chronic shortness of breath with exertion, dizziness, ankle swelling, and cough. These are new or worse. She denies any fever, chills, headache, or blurry vision. She denies chest pain, tightness, or pressure. No strokelike symptoms. She's tolerating all her medications well without any obvious side effects. She's not been monitoring her blood pressure at home. She has not found a primary care physician yet.  Review of Systems   Other than as noted above, the patient denies any of the following symptoms: Respiratory:  No coughing, wheezing, or shortness of breath. Cardiac:  No chest pain, tightness, pressure, palpitations, syncope, or edema. Neuro:  No headache, dizziness, blurred vision, weakness, paresthesias, or strokelike symptoms.   [place]   Past medical history, family history, social history, meds, and allergies were reviewed.    Physical Examination   Vital signs:  BP 133/63  Pulse 92  Temp(Src) 98.3 F (36.8 C) (Oral)  Resp 16  SpO2 96% General:  Alert, oriented, in no distress. Lungs:  Breath sounds clear and equal bilaterally.  No wheezes, rales, or rhonchi. Heart:  Regular rhythm, no gallops, murmers, clicks or rubs.  Abdomen:  Soft and flat.  Nontender, no organomegaly or mass.  No pulsatile midline abdominal mass or bruit. Ext:  She has trace ankle and pedal edema, pulses were not felt. Neurological exam:  Alert and oriented.  Speech is clear.  No pronator drift.  CNs intact.  Labs    Results for orders placed during the hospital encounter of 08/06/14  POCT I-STAT, CHEM 8      Result Value Ref Range   Sodium 140  137 - 147 mEq/L   Potassium 2.9 (*) 3.7 - 5.3 mEq/L   Chloride 98  96 - 112 mEq/L   BUN 20  6 - 23 mg/dL   Creatinine, Ser 1.00  0.50 - 1.10 mg/dL   Glucose, Bld 156 (*) 70 - 99 mg/dL   Calcium, Ion 1.18  1.13 - 1.30 mmol/L   TCO2 33  0 - 100 mmol/L   Hemoglobin 14.6  12.0 - 15.0 g/dL   HCT 43.0  36.0 - 46.0 %    Assessment   The primary encounter diagnosis was Essential hypertension. Diagnoses of Gastroesophageal reflux disease, esophagitis presence not specified, Constipation, unspecified constipation type, Hypothyroidism, unspecified hypothyroidism type, and Hypokalemia were also pertinent to this visit.  She'll need a followup with her primary care physician as soon as possible and was given the name of the [facility] wellness clinic, but earlier she can get in there is probably about 2 months from now. She was provided with enough refills to last until then. She was also placed on Klor-Con 20 mEq twice a day with instructions to return in one week for recheck her potassium.  Plan   1.  Meds:  The following meds were prescribed:   Discharge Medication List as of 08/06/2014  1:06 PM    START taking these medications   Details  !! amLODipine (NORVASC) 5 MG tablet  Take 1 tablet (5 mg total) by mouth daily., Starting 08/06/2014, Until Discontinued, Normal    benzonatate (TESSALON) 200 MG capsule Take 1 capsule (200 mg total) by mouth 3 (three) times daily as needed for cough., Starting 08/06/2014, Until Discontinued, Normal    !! hydrochlorothiazide (HYDRODIURIL) 25 MG tablet Take 1 tablet (25 mg total) by mouth daily., Starting 08/06/2014, Until Discontinued, Normal    !! levothyroxine (SYNTHROID) 100 MCG tablet Take 1 tablet (100 mcg total) by mouth daily before breakfast., Starting 08/06/2014, Until Discontinued, Normal    !! pantoprazole  (PROTONIX) 40 MG tablet Take 1 tablet (40 mg total) by mouth daily., Starting 08/06/2014, Until Discontinued, Normal    potassium chloride (KLOR-CON) 20 MEQ packet Take 20 mEq by mouth 2 (two) times daily., Starting 08/06/2014, Until Discontinued, Normal     !! - Potential duplicate medications found. Please discuss with provider.      2.  Patient Education/Counseling:  The patient was given appropriate handouts, self care instructions, and instructed in symptomatic relief. Specifically discussed salt and sodium restriction, weight control, and exercise.   3.  Follow up:  The patient was told to follow up here if no better in 3 to 4 days, or sooner if becoming worse in any way, and given some red flag symptoms such as severe headache, vision changes, shortness of breath, chest pain or stroke like symptoms which would prompt immediate return.  Return here in one week for a repeat i-STAT 8.    Harden Mo, MD 08/06/14 [phone]

## 2014-08-06 NOTE — ED Notes (Signed)
Call from pharmacy [facility] on [address].  Rx for potassium packets changed to tablets per Dr. Jake Michaelis.  Mw,cma

## 2014-08-06 NOTE — Discharge Instructions (Signed)
Blood pressure over the ideal can put you at higher risk for stroke, heart disease, and kidney failure.  For this reason, it's important to try to get your blood pressure as close as possible to the ideal.  The ideal blood pressure is 120/80.  Blood pressures from 469-629 systolic over 52-84 diastolic are labeled as "prehypertension."  This means you are at higher risk of developing hypertension in the future.  Blood pressures in this range are not treated with medication, but lifestyle changes are recommended to prevent progression to hypertension.  Blood pressures of 132 and above systolic over 90 and above diastolic are classified as hypertension and are treated with medications.  Lifestyle changes which can benefit both prehypertension and hypertension include the following:   Salt and sodium restriction.  Weight loss.  Regular exercise.  Avoidance of tobacco.  Avoidance of excess alcohol.  The "D.A.S.H" diet.   People with hypertension and prehypertension should limit their salt intake to less than 1500 mg daily.  Reading the nutrition information on the label of many prepared foods can give you an idea of how much sodium you're consuming at each meal.  Remember that the most important number on the nutrition information is the serving size.  It may be smaller than you think.  Try to avoid adding extra salt at the table.  You may add small amounts of salt while cooking.  Remember that salt is an acquired taste and you may get used to a using a whole lot less salt than you are using now.  Using less salt lets the food's natural flavors come through.  You might want to consider using salt substitutes, potassium chloride, pepper, or blends of herbs and spices to enhance the flavor of your food.  Foods that contain the most salt include: processed meats (like ham, bacon, lunch meat, sausage, hot dogs, and breakfast meat), chips, pretzels, salted nuts, soups, salty snacks, canned foods, junk  food, fast food, restaurant food, mustard, pickles, pizza, popcorn, soy sauce, and worcestershire sauce--quite a list!  You might ask, "Is there anything I can eat?"  The answer is, "yes."  Fruits and vegetables are usually low in salt.  Fresh is better than frozen which is better than canned.  If you have canned vegetables, you can cut down on the salt content by rinsing them in tap water 3 times before cooking.     Weight loss is the second thing you can do to lower your blood pressure.  Getting to and maintaining ideal weight will often normalize your blood pressure and allow you to avoid medications, entirely, cut way down on your dosage of medications, or allow to wean off your meds.  (Note, this should only be done under the supervision of your primary care doctor.)  Of course, weight loss takes time and you may need to be on medication in the meantime.  You shoot for a body mass index of 20-25.  When you go to the urgent care or to your primary care doctor, they should calculate your BMI.  If you don't know what it is, ask.  You can calculate your BMI with the following formula:  Weight in pounds x 703/ (height in inches) x (height in inches).  There are many good diets out there: Weight Watchers and the D.A.S.H. Diet are the best, but often, just modifying a few factors can be helpful:  Don't skip meals, don't eat out, and keeping a food diary.  I do not recommend  fad diets or diet pills which often raise blood pressure.    Everyone should get regular exercise, but this is particularly important for people with high blood pressure.  Just about any exercise is good.  The only exercise which may be harmful is lifting extreme heavy weights.  I recommend moderate exercise such as walking for 30 minutes 5 days a week.  Going to the gym for a 50 minute workout 3 times a week is also good.  This amounts to 150 minutes of exercise weekly.   Anyone with high blood pressure should avoid any use of tobacco.   Tobacco use does not elevate blood pressure, but it increases the risk of heart disease and stroke.  If you are interested in quitting, discuss with your doctor how to quit.  If you are not interested in quitting, ask yourself, "What would my life be like in 10 years if I continue to smoke?"  "How will I know when it is time to quit?"  "How would my life be better if I were to quit."   Excess alcohol intake can raise the blood pressure.  The safe alcohol intake is 2 drinks or less per day for men and 1 drink per day or less for women.   There is a very good diet which I recommend that has been designed for people with blood pressure called the D.A.S.H. Diet (dietary approaches to stop hypertension).  It consists of fruits, vegetables, lean meats, low fat dairy, whole grains, nuts and seeds.  It is very low in salt and sodium.  It has also been found to have other beneficial health effects such as lowering cholesterol and helping lose weight.  It has been developed by the [employer] and can be downloaded from the internet without any cost. Just do a Development worker, community on "D.A.S.H. Diet." or go the NIH website (MasterBoxes.it).  There are also cookbooks and diet plans that can be gotten from [place] to help you with this diet.    Potassium Content of Foods Potassium is a mineral found in many foods and drinks. It helps keep fluids and minerals balanced in your body and affects how steadily your heart beats. Potassium also helps control your blood pressure and keep your muscles and nervous system healthy. Certain health conditions and medicines may change the balance of potassium in your body. When this happens, you can help balance your level of potassium through the foods that you do or do not eat. Your health care provider or dietitian may recommend an amount of potassium that you should have each day. The following lists of foods provide the amount of potassium (in parentheses) per serving in  each item. HIGH IN POTASSIUM  The following foods and beverages have 200 mg or more of potassium per serving:  Apricots, 2 raw or 5 dry (200 mg).  Artichoke, 1 medium (345 mg).  Avocado, raw,  each (245 mg).  Banana, 1 medium (425 mg).  Beans, lima, or baked beans, canned,  cup (280 mg).  Beans, white, canned,  cup (595 mg).  Beef roast, 3 oz (320 mg).  Beef, ground, 3 oz (270 mg).  Beets, raw or cooked,  cup (260 mg).  Bran muffin, 2 oz (300 mg).  Broccoli,  cup (230 mg).  Brussels sprouts,  cup (250 mg).  Cantaloupe,  cup (215 mg).  Cereal, 100% bran,  cup (200-400 mg).  Cheeseburger, single, fast food, 1 each (225-400 mg).  Chicken, 3 oz (  220 mg).  Clams, canned, 3 oz (535 mg).  Crab, 3 oz (225 mg).  Dates, 5 each (270 mg).  Dried beans and peas,  cup (300-475 mg).  Figs, dried, 2 each (260 mg).  Fish: halibut, tuna, cod, snapper, 3 oz (480 mg).  Fish: salmon, haddock, swordfish, perch, 3 oz (300 mg).  Fish, tuna, canned 3 oz (200 mg).  [place] fries, fast food, 3 oz (470 mg).  Granola with fruit and nuts,  cup (200 mg).  Grapefruit juice,  cup (200 mg).  Greens, beet,  cup (655 mg).  Honeydew melon,  cup (200 mg).  Kale, raw, 1 cup (300 mg).  Kiwi, 1 medium (240 mg).  Kohlrabi, rutabaga, parsnips,  cup (280 mg).  Lentils,  cup (365 mg).  Mango, 1 each (325 mg).  Milk, chocolate, 1 cup (420 mg).  Milk: nonfat, low-fat, whole, buttermilk, 1 cup (350-380 mg).  Molasses, 1 Tbsp (295 mg).  Mushrooms,  cup (280) mg.  Nectarine, 1 each (275 mg).  Nuts: almonds, peanuts, hazelnuts, [place], cashew, mixed, 1 oz (200 mg).  Nuts, pistachios, 1 oz (295 mg).  Orange, 1 each (240 mg).  Orange juice,  cup (235 mg).  Papaya, medium,  fruit (390 mg).  Peanut butter, chunky, 2 Tbsp (240 mg).  Peanut butter, smooth, 2 Tbsp (210 mg).  Pear, 1 medium (200 mg).  Pomegranate, 1 whole (400 mg).  Pomegranate juice,  cup  (215 mg).  Pork, 3 oz (350 mg).  Potato chips, salted, 1 oz (465 mg).  Potato, baked with skin, 1 medium (925 mg).  Potatoes, boiled,  cup (255 mg).  Potatoes, mashed,  cup (330 mg).  Prune juice,  cup (370 mg).  Prunes, 5 each (305 mg).  Pudding, chocolate,  cup (230 mg).  Pumpkin, canned,  cup (250 mg).  Raisins, seedless,  cup (270 mg).  Seeds, sunflower or pumpkin, 1 oz (240 mg).  Soy milk, 1 cup (300 mg).  Spinach,  cup (420 mg).  Spinach, canned,  cup (370 mg).  Sweet potato, baked with skin, 1 medium (450 mg).  Swiss chard,  cup (480 mg).  Tomato or vegetable juice,  cup (275 mg).  Tomato sauce or puree,  cup (400-550 mg).  Tomato, raw, 1 medium (290 mg).  Tomatoes, canned,  cup (200-300 mg).  [place], 3 oz (250 mg).  Wheat germ, 1 oz (250 mg).  Winter squash,  cup (250 mg).  Yogurt, plain or fruited, 6 oz (260-435 mg).  Zucchini,  cup (220 mg). MODERATE IN POTASSIUM The following foods and beverages have 50-200 mg of potassium per serving:  Apple, 1 each (150 mg).  Apple juice,  cup (150 mg).  Applesauce,  cup (90 mg).  Apricot nectar,  cup (140 mg).  Asparagus, small spears,  cup or 6 spears (155 mg).  Bagel, cinnamon raisin, 1 each (130 mg).  Bagel, egg or plain, 4 in., 1 each (70 mg).  Beans, green,  cup (90 mg).  Beans, yellow,  cup (190 mg).  Beer, regular, 12 oz (100 mg).  Beets, canned,  cup (125 mg).  Blackberries,  cup (115 mg).  Blueberries,  cup (60 mg).  Bread, whole wheat, 1 slice (70 mg).  Broccoli, raw,  cup (145 mg).  Cabbage,  cup (150 mg).  Carrots, cooked or raw,  cup (180 mg).  Cauliflower, raw,  cup (150 mg).  Celery, raw,  cup (155 mg).  Cereal, bran flakes, cup (120-150 mg).  Cheese, cottage,  cup (110  mg).  Cherries, 10 each (150 mg).  Chocolate, 1 oz bar (165 mg).  Coffee, brewed 6 oz (90 mg).  Corn,  cup or 1 ear (195 mg).  Cucumbers,  cup (80  mg).  Egg, large, 1 each (60 mg).  Eggplant,  cup (60 mg).  Endive, raw, cup (80 mg).  English muffin, 1 each (65 mg).  Fish, orange roughy, 3 oz (150 mg).  Frankfurter, beef or pork, 1 each (75 mg).  Fruit cocktail,  cup (115 mg).  Grape juice,  cup (170 mg).  Grapefruit,  fruit (175 mg).  Grapes,  cup (155 mg).  Greens: kale, turnip, collard,  cup (110-150 mg).  Ice cream or frozen yogurt, chocolate,  cup (175 mg).  Ice cream or frozen yogurt, vanilla,  cup (120-150 mg).  Lemons, limes, 1 each (80 mg).  Lettuce, all types, 1 cup (100 mg).  Mixed vegetables,  cup (150 mg).  Mushrooms, raw,  cup (110 mg).  Nuts: walnuts, pecans, or macadamia, 1 oz (125 mg).  Oatmeal,  cup (80 mg).  Okra,  cup (110 mg).  Onions, raw,  cup (120 mg).  Peach, 1 each (185 mg).  Peaches, canned,  cup (120 mg).  Pears, canned,  cup (120 mg).  Peas, green, frozen,  cup (90 mg).  Peppers, green,  cup (130 mg).  Peppers, red,  cup (160 mg).  Pineapple juice,  cup (165 mg).  Pineapple, fresh or canned,  cup (100 mg).  Plums, 1 each (105 mg).  Pudding, vanilla,  cup (150 mg).  Raspberries,  cup (90 mg).  Rhubarb,  cup (115 mg).  Rice, wild,  cup (80 mg).  Shrimp, 3 oz (155 mg).  Spinach, raw, 1 cup (170 mg).  Strawberries,  cup (125 mg).  Summer squash  cup (175-200 mg).  Swiss chard, raw, 1 cup (135 mg).  Tangerines, 1 each (140 mg).  Tea, brewed, 6 oz (65 mg).  Turnips,  cup (140 mg).  Watermelon,  cup (85 mg).  Wine, red, table, 5 oz (180 mg).  Wine, white, table, 5 oz (100 mg). LOW IN POTASSIUM The following foods and beverages have less than 50 mg of potassium per serving.  Bread, white, 1 slice (30 mg).  Carbonated beverages, 12 oz (less than 5 mg).  Cheese, 1 oz (20-30 mg).  Cranberries,  cup (45 mg).  Cranberry juice cocktail,  cup (20 mg).  Fats and oils, 1 Tbsp (less than 5 mg).  Hummus, 1 Tbsp (32  mg).  Nectar: papaya, mango, or pear,  cup (35 mg).  Rice, white or brown,  cup (50 mg).  Spaghetti or macaroni,  cup cooked (30 mg).  Tortilla, flour or corn, 1 each (50 mg).  Waffle, 4 in., 1 each (50 mg).  Water chestnuts,  cup (40 mg). Document Released: 06/06/2005 Document Revised: 10/28/2013 Document Reviewed: 09/19/2013 [facility] Patient Information 2015 [place], [place]. This information is not intended to replace advice given to you by your health care provider. Make sure you discuss any questions you have with your health care provider.

## 2014-08-06 NOTE — ED Notes (Signed)
Needs refill on norvasc took last pill today.  Pt is in the process of finding a primary doctor.   Voices no other concerns at this time.

## 2014-08-17 ENCOUNTER — Emergency Department (INDEPENDENT_AMBULATORY_CARE_PROVIDER_SITE_OTHER)
Admission: EM | Admit: 2014-08-17 | Discharge: 2014-08-17 | Disposition: A | Discharge status: Home / Self Care | Payer: Medicare Other | Source: Home / Self Care | Attending: Emergency Medicine | Admitting: Emergency Medicine

## 2014-08-17 ENCOUNTER — Encounter (HOSPITAL_COMMUNITY): Payer: Self-pay | Admitting: Emergency Medicine

## 2014-08-17 DIAGNOSIS — E876 Hypokalemia: Secondary | ICD-10-CM

## 2014-08-17 DIAGNOSIS — I5030 Unspecified diastolic (congestive) heart failure: Secondary | ICD-10-CM

## 2014-08-17 LAB — POCT I-STAT, CHEM 8
BUN: 19 mg/dL (ref 6–23)
CALCIUM ION: 1.16 mmol/L (ref 1.13–1.30)
CREATININE: 1 mg/dL (ref 0.50–1.10)
Chloride: 98 mEq/L (ref 96–112)
GLUCOSE: 101 mg/dL — AB (ref 70–99)
HCT: 45 % (ref 36.0–46.0)
HEMOGLOBIN: 15.3 g/dL — AB (ref 12.0–15.0)
POTASSIUM: 3.2 meq/L — AB (ref 3.7–5.3)
Sodium: 141 mEq/L (ref 137–147)
TCO2: 31 mmol/L (ref 0–100)

## 2014-08-17 MED ORDER — POTASSIUM CHLORIDE 20 MEQ PO PACK: 40.0000 meq | PACK | Freq: Two times a day (BID) | ORAL | Status: DC

## 2014-08-17 MED ORDER — FUROSEMIDE 20 MG PO TABS: 20.0000 mg | ORAL_TABLET | Freq: Every day | ORAL | Status: DC

## 2014-08-17 NOTE — Discharge Instructions (Signed)
Your potassium is better today at 3.2. Increase the potassium pill to 2 pills twice a day.  The testing done at the hospital showed your heart has some trouble fully relaxing, which can cause fluid to build up in your lungs and legs. With your shortness of breath getting worse, we are going to give you a water pill (lasix) to get rid of any excess fluid. Take lasix 20mg  once a day for 1 week to see if that helps.  If the shortness of breath is present at rest, you develop chest pain, or you have palpitations, please go to the emergency room.

## 2014-08-17 NOTE — ED Provider Notes (Signed)
CSN: [id number]     Arrival date & time 08/17/14  8185 History   First MD Initiated Contact with Patient 08/17/14 1049     Chief Complaint  Patient presents with  . Follow-up   (Consider location/radiation/quality/duration/timing/severity/associated sxs/prior Treatment) HPI She is a 78 year old woman here for recheck of her potassium. She was seen here on October 1 for a medication refill and at that time was found to have a potassium of 2.9. She was started on potassium chloride 40 mEq daily and instructed to followup for a potassium recheck. She's been taking all of her medications as prescribed. She is tolerating the potassium well. She does say that over the last week or so, her shortness of breath with exertion is getting a little worse. She does have swelling in her legs that is a chronic issue, but is maybe a little worse now. She states that her cough is improved using the [employer] No fevers. No shortness of breath at rest.  She was admitted to the hospital last month at which time she had an echocardiogram. It showed normal systolic function with grade 1 diastolic dysfunction.  Past Medical History  Diagnosis Date  . Hypertension   . Thyroid disease    Past Surgical History  Procedure Laterality Date  . Cholecystectomy     No family history on file. History  Substance Use Topics  . Smoking status: Never Smoker   . Smokeless tobacco: Not on file  . Alcohol Use: No   OB History   Grav Para Term Preterm Abortions TAB SAB Ect Mult Living                 Review of Systems  Constitutional: Negative.   HENT: Negative.   Respiratory: Positive for shortness of breath (with exertion).   Cardiovascular: Positive for leg swelling.  Neurological: Negative.     Allergies  Shellfish allergy  Home Medications   Prior to Admission medications   Medication Sig Start Date End Date Taking? Authorizing Provider  acetaminophen (TYLENOL) 325 MG tablet Take 650 mg by mouth  every 6 (six) hours as needed for moderate pain or headache.    Historical Provider, MD  amLODipine (NORVASC) 5 MG tablet Take 1 tablet (5 mg total) by mouth daily. 07/13/14   Modena Jansky, MD  amLODipine (NORVASC) 5 MG tablet Take 1 tablet (5 mg total) by mouth daily. 08/06/14   Harden Mo, MD  aspirin EC 81 MG tablet Take 1 tablet (81 mg total) by mouth daily. 07/13/14   Modena Jansky, MD  benzonatate (TESSALON) 200 MG capsule Take 1 capsule (200 mg total) by mouth 3 (three) times daily as needed for cough. 08/06/14   Harden Mo, MD  furosemide (LASIX) 20 MG tablet Take 1 tablet (20 mg total) by mouth daily. 08/17/14   Melony Overly, MD  hydrochlorothiazide (HYDRODIURIL) 25 MG tablet Take 25 mg by mouth every morning.    Historical Provider, MD  hydrochlorothiazide (HYDRODIURIL) 25 MG tablet Take 1 tablet (25 mg total) by mouth daily. 08/06/14   Harden Mo, MD  levothyroxine (SYNTHROID) 100 MCG tablet Take 1 tablet (100 mcg total) by mouth daily before breakfast. 08/06/14   Harden Mo, MD  levothyroxine (SYNTHROID, LEVOTHROID) 100 MCG tablet Take 100 mcg by mouth daily before breakfast.    Historical Provider, MD  pantoprazole (PROTONIX) 40 MG tablet Take 1 tablet (40 mg total) by mouth daily. 07/13/14   Modena Jansky, MD  pantoprazole (PROTONIX) 40 MG tablet Take 1 tablet (40 mg total) by mouth daily. 08/06/14   Harden Mo, MD  polyethylene glycol [facility] / Floria Raveling) packet Take 17 g by mouth daily. 07/13/14   Modena Jansky, MD  potassium chloride (KLOR-CON) 20 MEQ packet Take 40 mEq by mouth 2 (two) times daily. 08/17/14   Melony Overly, MD  senna (SENOKOT) 8.6 MG TABS tablet Take 2 tablets (17.2 mg total) by mouth daily as needed for moderate constipation. 07/13/14   Modena Jansky, MD   BP 164/98  Pulse 76  Temp(Src) 98 F (36.7 C) (Oral)  Resp 18  SpO2 98% Physical Exam  Constitutional: She is oriented to person, place, and time. She appears well-developed and  well-nourished. No distress.  Cardiovascular: Normal rate, regular rhythm and normal heart sounds.  Exam reveals no gallop.   No murmur heard. Pulmonary/Chest: Effort normal and breath sounds normal. No respiratory distress. She has no wheezes. She has no rales.  Musculoskeletal: She exhibits edema (1+ bilaterally to upper shin). She exhibits no tenderness.  Neurological: She is alert and oriented to person, place, and time.  Skin: Skin is warm and dry.    ED Course  Procedures (including critical care time) Labs Review Labs Reviewed  POCT I-STAT, CHEM 8 - Abnormal; Notable for the following:    Potassium 3.2 (*)    Glucose, Bld 101 (*)    Hemoglobin 15.3 (*)    All other components within normal limits    Imaging Review No results found.   MDM   1. Hypokalemia   2. Diastolic heart failure, unspecified heart failure chronicity    Hypokalemia is improved at 3.2. As I am starting Lasix, we increased the potassium to 40 mEq twice a day.  Echo done in September of this year showed grade 1 diastolic dysfunction.  With her worsening dyspnea on exertion in the swelling in her legs, will do a trial of Lasix. Take Lasix 20 mg daily for one week.  Reasons to go the ER reviewed as in after visit summary. Followup with primary care for repeat labs in the next month or so.    Melony Overly, MD 08/17/14 [phone]

## 2014-08-17 NOTE — ED Notes (Signed)
Patient seen in department 10/01 and potassium was 2.9.  Dr Jake Michaelis prescribed potasium and patient here today for follow up.  Patient does not have any complaints.

## 2014-09-15 DIAGNOSIS — E876 Hypokalemia: Secondary | ICD-10-CM | POA: Diagnosis not present

## 2014-09-15 DIAGNOSIS — I10 Essential (primary) hypertension: Secondary | ICD-10-CM | POA: Diagnosis not present

## 2016-05-18 IMAGING — CT CT ANGIO CHEST
1 of 2 series · 19 of 32 positions shown · IV contrast (OMNIPAQUE)
Comparison: Plain film 1 day prior.  Chest CT 12/15/2007

CLINICAL DATA: Headache.  Chest pain.  Dyspnea.  Elevated D-dimer.

EXAM:
CT ANGIOGRAPHY CHEST WITH CONTRAST
TECHNIQUE: Multidetector CT imaging of the chest was performed using the
standard protocol during bolus administration of intravenous
contrast. Multiplanar CT image reconstructions and MIPs were
obtained to evaluate the vascular anatomy.
CONTRAST:  100mL OMNIPAQUE IOHEXOL 350 MG/ML SOLN

[Series 6: thins · axial · 0.66mm/px · z∈[-264,-39]mm · 19 of 251 slices shown]
[im 13/251  lung]
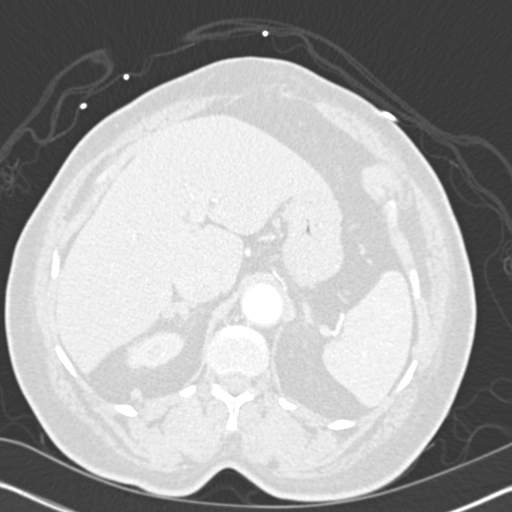
[im 26/251  mediastinal]
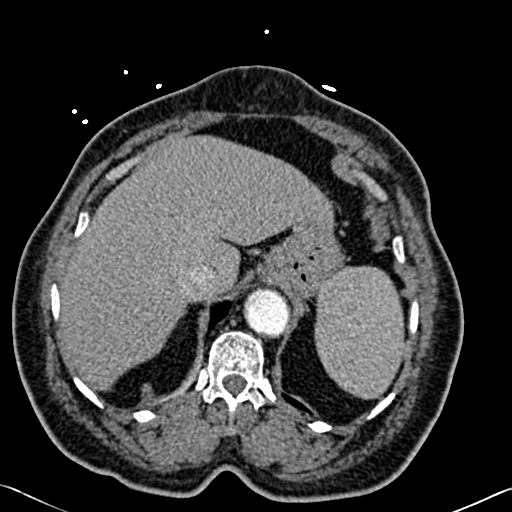
[im 38/251  lung]
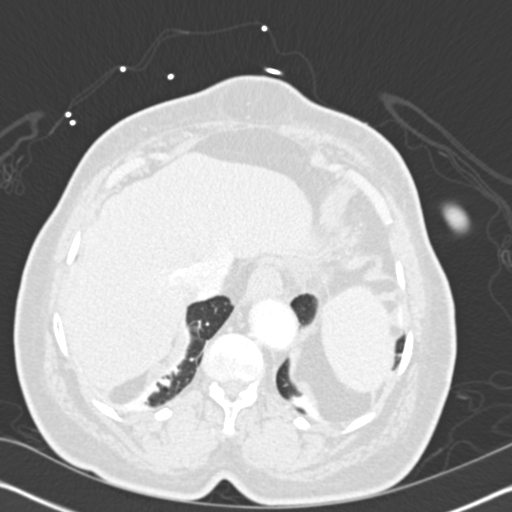
[im 63/251  mediastinal]
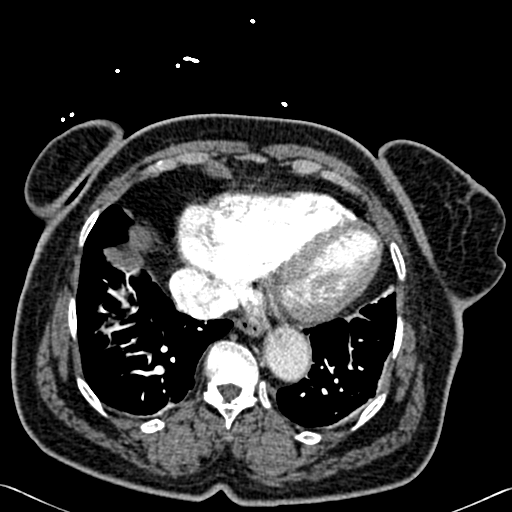
[im 76/251  lung]
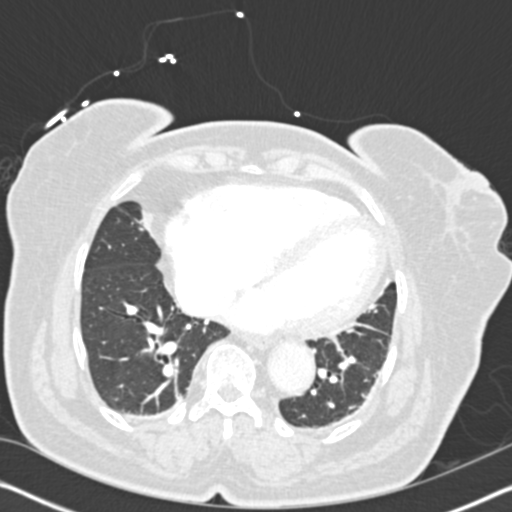
[im 84/251  mediastinal]
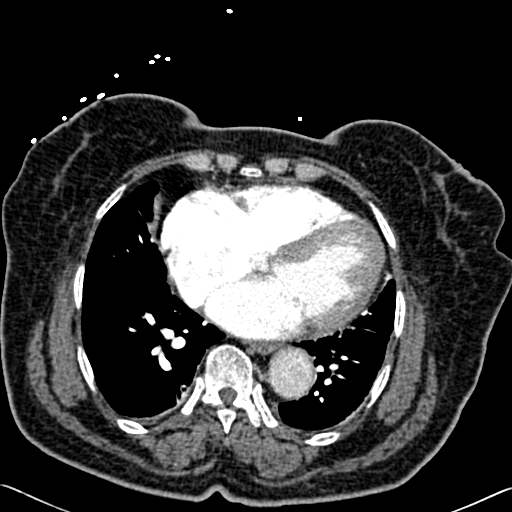
[im 88/251  lung]
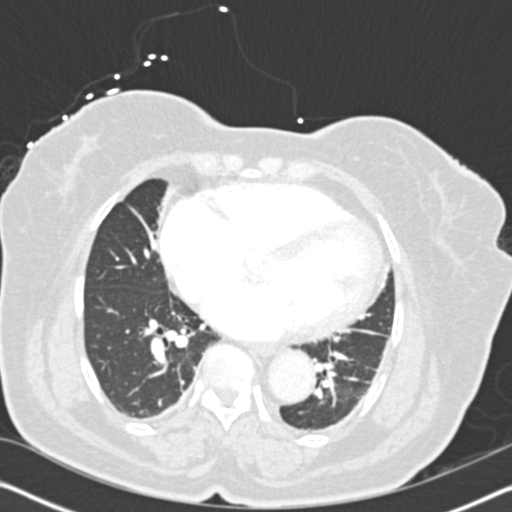
[im 101/251  mediastinal]
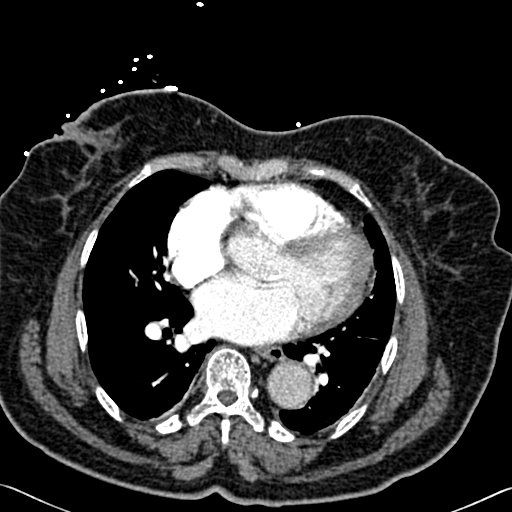
[im 113/251  lung]
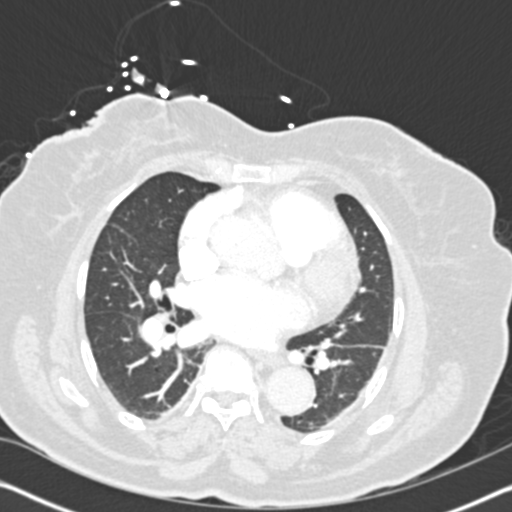
[im 126/251  mediastinal]
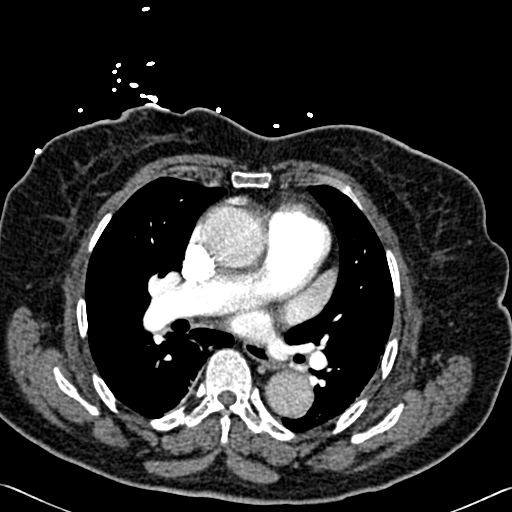
[im 138/251  lung]
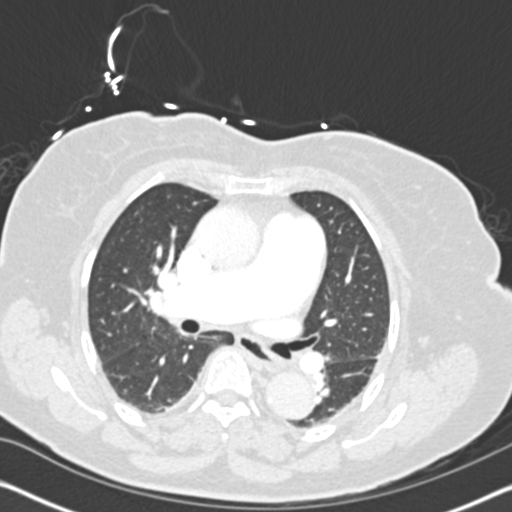
[im 151/251  mediastinal]
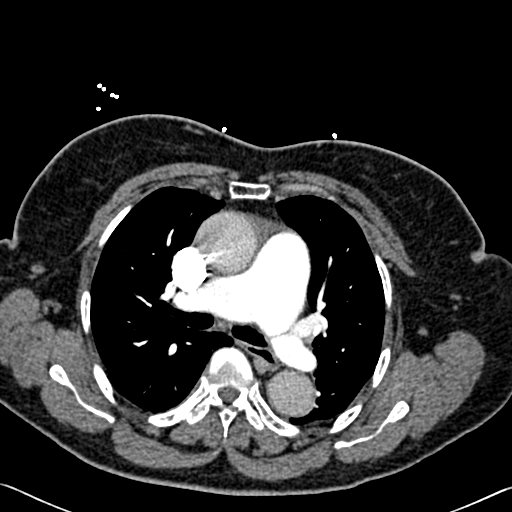
[im 163/251  lung]
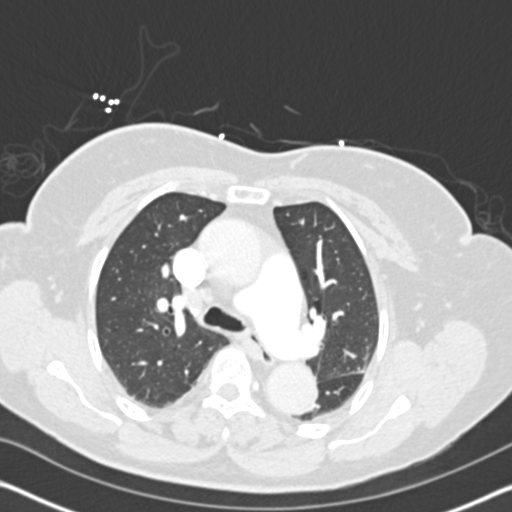
[im 167/251  mediastinal]
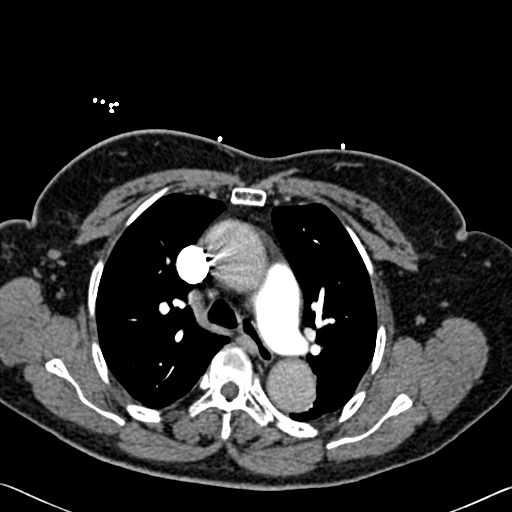
[im 176/251  lung]
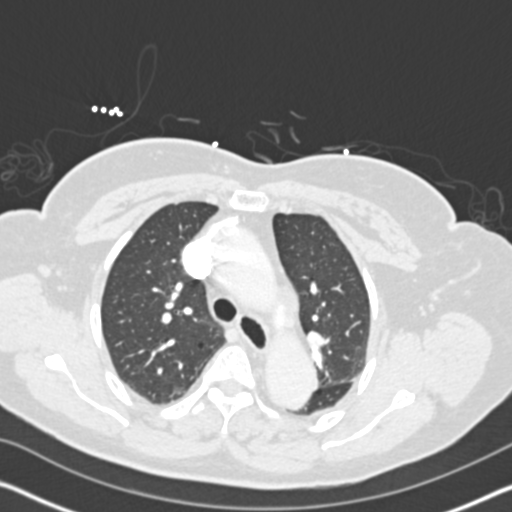
[im 188/251  mediastinal]
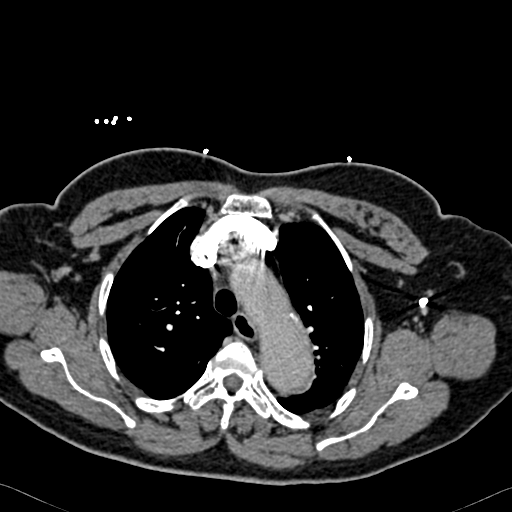
[im 213/251  lung]
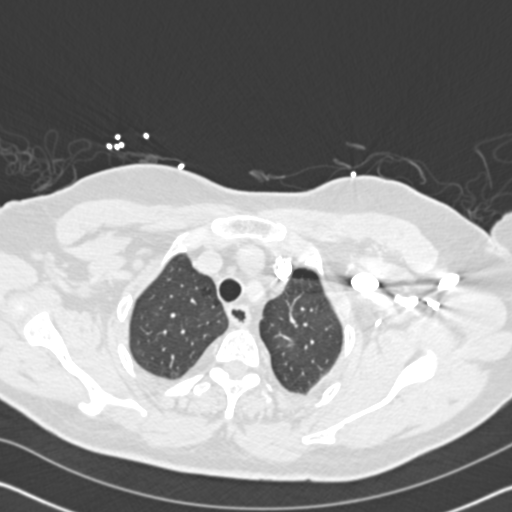
[im 226/251  mediastinal]
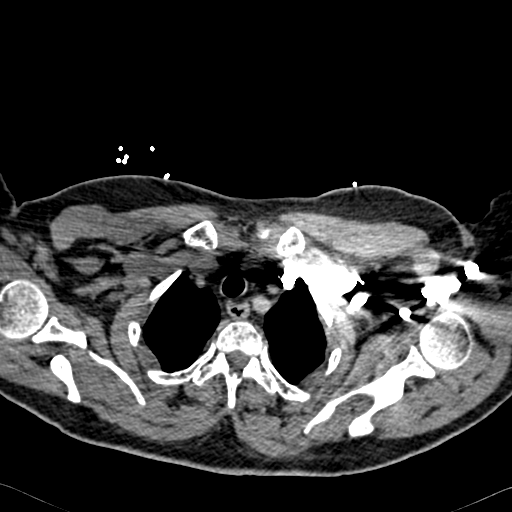
[im 238/251  lung]
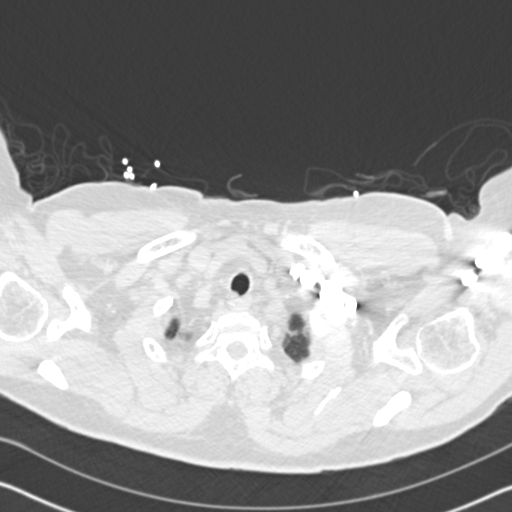

[19 of 32 positions shown; findings below may reference images not displayed]

FINDINGS: Lungs/Pleura: Bibasilar volume loss with subsegmental atelectasis.
No pleural fluid.

Heart/Mediastinum: The quality of this examination for evaluation of
pulmonary embolism is moderate to good. Large amount of contrast is
identified within the SVC. No evidence of pulmonary embolism.

Pulmonary arteries are enlarged, with the outflow tract measuring
4.2 cm and the right pulmonary artery measuring 3.4 cm.

Mild cardiomegaly without pericardial effusion. Ascending aorta
upper normal in size, 3.7 cm. Atherosclerosis within the descending
aorta. No mediastinal or hilar adenopathy. Small hiatal hernia.
Fluid level in the esophagus.

Upper Abdomen: Reflux contrast into the IVC and hepatic veins.
Subcentimeter low-density splenic lesions which were present on the
prior and are of no significance.

Normal imaged portions of the stomach, adrenal glands, kidneys. The
intrahepatic ducts are borderline prominent, but this is similar to
the prior exam and likely related to prior hysterectomy. A fat
containing ventral abdominal wall hernia is incompletely imaged.

Bones/Musculoskeletal:  No acute osseous abnormality.

Review of the MIP images confirms the above findings.
IMPRESSION: 1. Moderate to good quality evaluation for pulmonary embolism,
without embolism identified.
2. Pulmonary artery enlargement suggests pulmonary arterial
hypertension.
3. No explanation for patient's symptoms.
4. Esophageal air fluid level suggests dysmotility or
gastroesophageal reflux.
5. Elevated right heart pressures, as evidenced by contrast reflux
into the IVC and hepatic veins.
6. Incompletely imaged fat containing ventral abdominal wall hernia.

## 2018-04-02 DIAGNOSIS — I10 Essential (primary) hypertension: Secondary | ICD-10-CM | POA: Diagnosis not present

## 2018-04-02 DIAGNOSIS — E039 Hypothyroidism, unspecified: Secondary | ICD-10-CM | POA: Diagnosis not present

## 2018-04-02 DIAGNOSIS — R7303 Prediabetes: Secondary | ICD-10-CM | POA: Diagnosis not present

## 2018-04-02 DIAGNOSIS — H538 Other visual disturbances: Secondary | ICD-10-CM | POA: Diagnosis not present

## 2018-04-02 DIAGNOSIS — Z136 Encounter for screening for cardiovascular disorders: Secondary | ICD-10-CM | POA: Diagnosis not present

## 2018-04-02 DIAGNOSIS — Z01118 Encounter for examination of ears and hearing with other abnormal findings: Secondary | ICD-10-CM | POA: Diagnosis not present

## 2018-04-02 DIAGNOSIS — Z131 Encounter for screening for diabetes mellitus: Secondary | ICD-10-CM | POA: Diagnosis not present

## 2018-04-02 DIAGNOSIS — E669 Obesity, unspecified: Secondary | ICD-10-CM | POA: Diagnosis not present

## 2018-04-04 ENCOUNTER — Telehealth: Payer: Self-pay | Admitting: Hematology and Oncology

## 2018-04-04 NOTE — Telephone Encounter (Signed)
Referral received from Dr. Vista Lawman for a dx of thrombocytosis. Spoke to the [name]'s daughter and scheduled the [name] to see Dr. Lindi Adie on 6/3 at 1015am. Aware that her mother should arrive 30 minutes early to be checked in on time.

## 2018-04-08 ENCOUNTER — Telehealth: Payer: Self-pay

## 2018-04-08 ENCOUNTER — Inpatient Hospital Stay: Payer: Medicare Other

## 2018-04-08 ENCOUNTER — Telehealth: Payer: Self-pay | Admitting: Hematology and Oncology

## 2018-04-08 ENCOUNTER — Inpatient Hospital Stay: Payer: Medicare Other | Attending: Hematology and Oncology | Admitting: Hematology and Oncology

## 2018-04-08 DIAGNOSIS — E079 Disorder of thyroid, unspecified: Secondary | ICD-10-CM | POA: Diagnosis not present

## 2018-04-08 DIAGNOSIS — D473 Essential (hemorrhagic) thrombocythemia: Secondary | ICD-10-CM | POA: Diagnosis not present

## 2018-04-08 DIAGNOSIS — Z9049 Acquired absence of other specified parts of digestive tract: Secondary | ICD-10-CM | POA: Insufficient documentation

## 2018-04-08 DIAGNOSIS — I10 Essential (primary) hypertension: Secondary | ICD-10-CM | POA: Insufficient documentation

## 2018-04-08 DIAGNOSIS — Z7982 Long term (current) use of aspirin: Secondary | ICD-10-CM | POA: Diagnosis not present

## 2018-04-08 DIAGNOSIS — Z79899 Other long term (current) drug therapy: Secondary | ICD-10-CM | POA: Diagnosis not present

## 2018-04-08 LAB — CBC WITH DIFFERENTIAL (CANCER CENTER ONLY)
BASOS PCT: 1 %
Basophils Absolute: 0.1 10*3/uL (ref 0.0–0.1)
EOS ABS: 0.5 10*3/uL (ref 0.0–0.5)
Eosinophils Relative: 5 %
HCT: 36.8 % (ref 34.8–46.6)
Hemoglobin: 12 g/dL (ref 11.6–15.9)
Lymphocytes Relative: 27 %
Lymphs Abs: 2.4 10*3/uL (ref 0.9–3.3)
MCH: 25.8 pg (ref 25.1–34.0)
MCHC: 32.6 g/dL (ref 31.5–36.0)
MCV: 79.2 fL — ABNORMAL LOW (ref 79.5–101.0)
MONO ABS: 1 10*3/uL — AB (ref 0.1–0.9)
MONOS PCT: 12 %
NEUTROS PCT: 55 %
Neutro Abs: 4.9 10*3/uL (ref 1.5–6.5)
Platelet Count: 1074 10*3/uL (ref 145–400)
RBC: 4.65 MIL/uL (ref 3.70–5.45)
RDW: 16.9 % — ABNORMAL HIGH (ref 11.2–14.5)
WBC Count: 8.9 10*3/uL (ref 3.9–10.3)

## 2018-04-08 MED ORDER — ANAGRELIDE HCL 0.5 MG PO CAPS: 0.5000 mg | ORAL_CAPSULE | Freq: Every day | ORAL | 0 refills | Status: DC

## 2018-04-08 NOTE — Telephone Encounter (Signed)
Gave patient AVS and calendar of upcoming june appointments

## 2018-04-08 NOTE — Telephone Encounter (Signed)
Received platelet results of 1074- notified Dr Lindi Adie, no orders received at this time.

## 2018-04-08 NOTE — Progress Notes (Signed)
[facility] CONSULT NOTE  Patient Care Team: System, Provider Not In as PCP - General  CHIEF COMPLAINTS/PURPOSE OF CONSULTATION:  Severe thrombocytosis  HISTORY OF PRESENTING ILLNESS:  [name] 82 y.o. female is here because of elevated platelet counts.  Patient tells me that she was informed of had platelet counts in 2015 when she was admitted to the hospital with chest discomfort.  She was apparently seen by hematology although I do not have any record of a clinic note from hematology.  She was apparently placed on hydroxyurea but she could not tolerate it and she stopped taking it after a month.  Primary side effects were darkening of the skin of the face.  Recent blood work performed by her primary care physician revealed a markedly elevated platelet count of 1000 133.  The rest of the CBC was normal.  Iron studies were also normal.  There was a note made for presence of metamyelocytes.  Otherwise the differential is normal and the reticulocyte count 1.8% with a hemoglobin of 12.9.  She was brought in by her granddaughter. Patient is originally from [place] she visits the [place] often.  Her major complaint is tremors but otherwise she appears to be able to manage her activities of daily living fairly well and independently.  I reviewed her records extensively and collaborated the history with the patient.   MEDICAL HISTORY:  Past Medical History:  Diagnosis Date  . Hypertension   . Thyroid disease     SURGICAL HISTORY: Past Surgical History:  Procedure Laterality Date  . CHOLECYSTECTOMY      SOCIAL HISTORY: Social History   Socioeconomic History  . Marital status: Widowed    Spouse name: Not on file  . Number of children: Not on file  . Years of education: Not on file  . Highest education level: Not on file  Occupational History  . Not on file  Social Needs  . Financial resource strain: Not on file  . Food insecurity:    Worry: Not on file     Inability: Not on file  . Transportation needs:    Medical: Not on file    Non-medical: Not on file  Tobacco Use  . Smoking status: Never Smoker  Substance and Sexual Activity  . Alcohol use: No  . Drug use: Not on file  . Sexual activity: Never  Lifestyle  . Physical activity:    Days per week: Not on file    Minutes per session: Not on file  . Stress: Not on file  Relationships  . Social connections:    Talks on phone: Not on file    Gets together: Not on file    Attends religious service: Not on file    Active member of club or organization: Not on file    Attends meetings of clubs or organizations: Not on file    Relationship status: Not on file  . Intimate partner violence:    Fear of current or ex partner: Not on file    Emotionally abused: Not on file    Physically abused: Not on file    Forced sexual activity: Not on file  Other Topics Concern  . Not on file  Social History Narrative  . Not on file    FAMILY HISTORY: No family history on file.  ALLERGIES:  is allergic to shellfish allergy.  MEDICATIONS:  Current Outpatient Medications  Medication Sig Dispense Refill  . acetaminophen (TYLENOL) 325 MG tablet Take  650 mg by mouth every 6 (six) hours as needed for moderate pain or headache.    Marland Kitchen amLODipine (NORVASC) 10 MG tablet TK 1 T PO QD  1  . aspirin EC 81 MG tablet Take 1 tablet (81 mg total) by mouth daily. 30 tablet 0  . hydrochlorothiazide (HYDRODIURIL) 25 MG tablet Take 25 mg by mouth every morning.    Marland Kitchen levothyroxine (SYNTHROID) 100 MCG tablet Take 1 tablet (100 mcg total) by mouth daily before breakfast. 30 tablet 3  . anagrelide (AGRYLIN) 0.5 MG capsule Take 1 capsule (0.5 mg total) by mouth daily. 30 capsule 0  . polyethylene glycol (MIRALAX / GLYCOLAX) packet Take 17 g by mouth daily. (Patient not taking: Reported on 04/08/2018) 30 each 0  . potassium chloride (KLOR-CON) 20 MEQ packet Take 40 mEq by mouth 2 (two) times daily. (Patient not taking:  Reported on 04/08/2018) 120 tablet 3  . senna (SENOKOT) 8.6 MG TABS tablet Take 2 tablets (17.2 mg total) by mouth daily as needed for moderate constipation. (Patient not taking: Reported on 04/08/2018) 30 each 0   No current facility-administered medications for this visit.     REVIEW OF SYSTEMS:   Constitutional: Denies fevers, chills or abnormal night sweats Eyes: Denies blurriness of vision, double vision or watery eyes Ears, nose, mouth, throat, and face: Denies mucositis or sore throat Respiratory: Denies cough, dyspnea or wheezes Cardiovascular: Denies palpitation, chest discomfort or lower extremity swelling Gastrointestinal:  Denies nausea, heartburn or change in bowel habits Skin: Denies abnormal skin rashes Lymphatics: Denies new lymphadenopathy or easy bruising Neurological:Denies numbness, tingling or new weaknesses Behavioral/Psych: Mood is stable, no new changes   All other systems were reviewed with the patient and are negative.  PHYSICAL EXAMINATION: ECOG PERFORMANCE STATUS: 1 - Symptomatic but completely ambulatory  Vitals:   04/08/18 0941  BP: 134/79  Pulse: 62  Resp: 17  Temp: 98.5 F (36.9 C)  SpO2: 97%   Filed Weights   04/08/18 0941  Weight: 158 lb 12.8 oz (72 kg)    GENERAL:alert, no distress and comfortable SKIN: skin color, texture, turgor are normal, no rashes or significant lesions EYES: normal, conjunctiva are pink and non-injected, sclera clear OROPHARYNX:no exudate, no erythema and lips, buccal mucosa, and tongue normal  NECK: supple, thyroid normal size, non-tender, without nodularity LYMPH:  no palpable lymphadenopathy in the cervical, axillary or inguinal LUNGS: clear to auscultation and percussion with normal breathing effort HEART: regular rate & rhythm and no murmurs and no lower extremity edema ABDOMEN:abdomen soft, non-tender and normal bowel sounds Musculoskeletal:no cyanosis of digits and no clubbing  PSYCH: alert & oriented x 3 with  fluent speech NEURO: no focal motor/sensory deficits   LABORATORY DATA:  I have reviewed the data as listed Lab Results  Component Value Date   WBC 8.9 04/08/2018   HGB 12.0 04/08/2018   HCT 36.8 04/08/2018   MCV 79.2 (L) 04/08/2018   PLT 1,074 (HH) 04/08/2018   Lab Results  Component Value Date   NA 141 08/17/2014   K 3.2 (L) 08/17/2014   CL 98 08/17/2014   CO2 32 07/12/2014    RADIOGRAPHIC STUDIES: I have personally reviewed the radiological reports and agreed with the findings in the report.  ASSESSMENT AND PLAN:  Essential thrombocytosis (HCC) Thrombocytosis:  Lab review: 04/03/2018 Platelet count 1133 Hemoglobin 12.9, reticulocyte count 1.8% WBC 9.2 with normal differential Iron studies: Ferritin 30, iron saturation 17%, TIBC 737 T06 269, folic acid 48.5 Blood smear: Occasional metamyelocytes  seen ------------------------------------------------------------------------------------------ Differential diagnosis 1. Primary thrombocytosis: Related to myeloproliferative disorders of the bone marrow especially essential thrombocytosis and CML. I would like to send for BCR-ABL as well as JAK-2 dictation testings. Patient understands that JAK2 mutation is only present in 50% of essential thrombocytosis so the test is advantageous only if it is positive. If it is negative, it does not rule out. 2. Secondary/reactive thrombocytosis Different causes including infections, inflammation, iron deficiency.  I would like to send out for JAK-2 mutation and PCR for BCR-ABL.  Treatment options: 1. If it is primary essential thrombocytosis, treatment would depend on platelet count level as well as history of thrombosis. A. For low risk patients, (platelet counts less than 1000 and no history of blood clots) the treatment would be with aspirin therapy B. for high risk patients(platelet counts greater than 1000/history of blood clot) the treatment would be platelet lowering therapy with  aspirin 2. Treatment of secondary thrombocytosis would be to treat underlying cause. There would not be any risk of thrombosis with secondary thrombocytosis.  I strongly suspect that the patient's primary essential thrombocytosis. I recommended treatment with anagrelide.  We will start at 0.5 mg daily.  We may increase it up to 1 mg daily depending on her labs.  She will return to see [place] back in 2 weeks to recheck her labs as well as to review the response to anagrelide.  All questions were answered. The patient knows to call the clinic with any problems, questions or concerns.    Harriette Ohara, MD 04/08/18

## 2018-04-08 NOTE — Assessment & Plan Note (Signed)
Thrombocytosis:  Lab review: 04/03/2018 Platelet count 1133 Hemoglobin 12.9, reticulocyte count 1.8% WBC 9.2 with normal differential Iron studies: Ferritin 30, iron saturation 17%, TIBC 017 P10 258, folic acid 52.7 Blood smear: Occasional metamyelocytes seen  Differential diagnosis 1. Primary thrombocytosis: Related to myeloproliferative disorders of the bone marrow especially essential thrombocytosis and CML. I would like to send for BCR-ABL as well as JAK-2 dictation testings. Patient understands that JAK2 mutation is only present in 50% of essential thrombocytosis so the test is advantageous only if it is positive. If it is negative, it does not rule out. 2. Secondary/reactive thrombocytosis Different causes including infections, inflammation, iron deficiency.  I would like to send out for C-reactive protein, JAK-2 mutation and PCR for BCR-ABL.  Treatment options: 1. If it is primary essential thrombocytosis, treatment would depend on platelet count level as well as history of thrombosis. A. For low risk patients, (platelet counts less than 1000 and no history of blood clots) the treatment would be with aspirin therapy B. for high risk patients(platelet counts greater than 1000/history of blood clot) the treatment would be platelet lowering therapy with aspirin 2. Treatment of secondary thrombocytosis would be to treat underlying cause. There would not be any risk of thrombosis with secondary thrombocytosis.  Return to clinic in 3 weeks to discuss the results of these tests.

## 2018-04-09 DIAGNOSIS — E785 Hyperlipidemia, unspecified: Secondary | ICD-10-CM | POA: Diagnosis not present

## 2018-04-09 DIAGNOSIS — E669 Obesity, unspecified: Secondary | ICD-10-CM | POA: Diagnosis not present

## 2018-04-09 DIAGNOSIS — E1165 Type 2 diabetes mellitus with hyperglycemia: Secondary | ICD-10-CM | POA: Diagnosis not present

## 2018-04-09 DIAGNOSIS — I10 Essential (primary) hypertension: Secondary | ICD-10-CM | POA: Diagnosis not present

## 2018-04-09 DIAGNOSIS — D473 Essential (hemorrhagic) thrombocythemia: Secondary | ICD-10-CM | POA: Diagnosis not present

## 2018-04-09 DIAGNOSIS — E7211 Homocystinuria: Secondary | ICD-10-CM | POA: Diagnosis not present

## 2018-04-09 DIAGNOSIS — E039 Hypothyroidism, unspecified: Secondary | ICD-10-CM | POA: Diagnosis not present

## 2018-04-09 DIAGNOSIS — Z0000 Encounter for general adult medical examination without abnormal findings: Secondary | ICD-10-CM | POA: Diagnosis not present

## 2018-04-11 DIAGNOSIS — G250 Essential tremor: Secondary | ICD-10-CM | POA: Diagnosis not present

## 2018-04-18 ENCOUNTER — Encounter (HOSPITAL_COMMUNITY): Payer: Self-pay

## 2018-04-18 ENCOUNTER — Other Ambulatory Visit: Payer: Self-pay

## 2018-04-18 ENCOUNTER — Emergency Department (HOSPITAL_COMMUNITY): Payer: Medicare Other

## 2018-04-18 ENCOUNTER — Observation Stay (HOSPITAL_COMMUNITY)
Admission: EM | Admit: 2018-04-18 | Discharge: 2018-04-18 | Disposition: A | Discharge status: Home / Self Care | Payer: Medicare Other | Attending: Internal Medicine | Admitting: Internal Medicine

## 2018-04-18 DIAGNOSIS — I509 Heart failure, unspecified: Secondary | ICD-10-CM | POA: Insufficient documentation

## 2018-04-18 DIAGNOSIS — Z7982 Long term (current) use of aspirin: Secondary | ICD-10-CM | POA: Insufficient documentation

## 2018-04-18 DIAGNOSIS — D473 Essential (hemorrhagic) thrombocythemia: Secondary | ICD-10-CM | POA: Diagnosis not present

## 2018-04-18 DIAGNOSIS — Z79890 Hormone replacement therapy (postmenopausal): Secondary | ICD-10-CM | POA: Diagnosis not present

## 2018-04-18 DIAGNOSIS — R072 Precordial pain: Secondary | ICD-10-CM | POA: Diagnosis not present

## 2018-04-18 DIAGNOSIS — Z79899 Other long term (current) drug therapy: Secondary | ICD-10-CM | POA: Diagnosis not present

## 2018-04-18 DIAGNOSIS — R079 Chest pain, unspecified: Secondary | ICD-10-CM | POA: Diagnosis not present

## 2018-04-18 DIAGNOSIS — R0602 Shortness of breath: Secondary | ICD-10-CM | POA: Diagnosis not present

## 2018-04-18 DIAGNOSIS — E039 Hypothyroidism, unspecified: Secondary | ICD-10-CM | POA: Insufficient documentation

## 2018-04-18 DIAGNOSIS — I110 Hypertensive heart disease with heart failure: Secondary | ICD-10-CM | POA: Insufficient documentation

## 2018-04-18 DIAGNOSIS — R110 Nausea: Secondary | ICD-10-CM | POA: Diagnosis not present

## 2018-04-18 HISTORY — DX: Prediabetes: R73.03

## 2018-04-18 HISTORY — DX: Thrombocytosis, unspecified: D75.839

## 2018-04-18 HISTORY — DX: Heart failure, unspecified: I50.9

## 2018-04-18 HISTORY — DX: Essential (hemorrhagic) thrombocythemia: D47.3

## 2018-04-18 LAB — CBC
HCT: 36.7 % (ref 36.0–46.0)
Hemoglobin: 11.8 g/dL — ABNORMAL LOW (ref 12.0–15.0)
MCH: 25.9 pg — ABNORMAL LOW (ref 26.0–34.0)
MCHC: 32.2 g/dL (ref 30.0–36.0)
MCV: 80.7 fL (ref 78.0–100.0)
Platelets: 1160 10*3/uL (ref 150–400)
RBC: 4.55 MIL/uL (ref 3.87–5.11)
RDW: 16.6 % — AB (ref 11.5–15.5)
WBC: 8.9 10*3/uL (ref 4.0–10.5)

## 2018-04-18 LAB — I-STAT TROPONIN, ED: TROPONIN I, POC: 0.01 ng/mL (ref 0.00–0.08)

## 2018-04-18 LAB — BASIC METABOLIC PANEL
Anion gap: 9 (ref 5–15)
BUN: 23 mg/dL — AB (ref 6–20)
CALCIUM: 9.6 mg/dL (ref 8.9–10.3)
CO2: 32 mmol/L (ref 22–32)
CREATININE: 1.06 mg/dL — AB (ref 0.44–1.00)
Chloride: 97 mmol/L — ABNORMAL LOW (ref 101–111)
GFR calc non Af Amer: 48 mL/min — ABNORMAL LOW (ref >60)
GFR, EST AFRICAN AMERICAN: 56 mL/min — AB (ref >60)
Glucose, Bld: 128 mg/dL — ABNORMAL HIGH (ref 65–99)
Potassium: 3.7 mmol/L (ref 3.5–5.1)
Sodium: 138 mmol/L (ref 135–145)

## 2018-04-18 MED ORDER — ASPIRIN 325 MG PO TABS
325.0000 mg | ORAL_TABLET | Freq: Once | ORAL | Status: AC
  Administered 2018-04-18: 325 mg via ORAL
  Filled 2018-04-18: qty 1

## 2018-04-18 NOTE — ED Notes (Signed)
Pt given soup and apple juice.

## 2018-04-18 NOTE — ED Notes (Signed)
Pt refusing further care and refusing admission  Dr Laverta Baltimore aware

## 2018-04-18 NOTE — Discharge Instructions (Addendum)
You were seen in the ED today with chest pain. Although the initial labs and EKG were reassuring, we preferred you to stay in the hospital overnight for additional testing, observation, and following labsgiven some of the features of your chest pain. You do have an echo scheduled tomorrow which is very important to go to and to follow up with your primary care doctor as soon as you can. If your pain returns and you have nausea, sweating, pain that spreads from your chest to left arm, or difficulty breathing, please return to the ED as soon as you can.

## 2018-04-18 NOTE — H&P (Deleted)
Hospitalist called to admit patient for chest pain. Admission orders already placed. Patient told me they would rather leave as patient already has an Echo scheduled for tomorrow and initial evaluation and work up in the Ed have so far been unremarkable. I explained why admission has being recommended and the risk of leaving AMA which could be fatal in case of a heart attack or blood clot in lungs. Patients daughter says 3 yrs ago- 2015 admission for chest pain evaluation and work-up was also negative.  Patient daughter says she lives nearby and can bring her mother back to the ED if needed. Patient and her daughter are electing to rather follow-up with her cardiologist outpatient tomorrow, and choose to leave even if it means signing out AMA.  Talked to ED provider - Resident, Dr. Ellyn Hack who communicated with his supervising physician Dr. Laverta Baltimore about patient's wishes.  Patient left AMA.  Arlyce Dice, MD [facility]. 2.01a.m, 04/19/2018

## 2018-04-18 NOTE — ED Triage Notes (Signed)
PT C/O CHEST PAIN ON AND OFF SINCE YESTERDAY. PT HAS A HX OF HEART FAILURE. PT TOOK HER ASA 81 TO DAY ALONG WITH TYLENOL TO HELP WITH THE PAIN.

## 2018-04-18 NOTE — ED Provider Notes (Addendum)
[facility] DEPT Provider Note   CSN: [id number] Arrival date & time: 04/18/18  1915   History   Chief Complaint Chief Complaint  Patient presents with  . Chest Pain    HPI [name] is a 82 y.o. female with a past medical history of HTN, Pre-DM, suspected essential thrombocytosis, hypothyroidism who presented to the ED with complaints of chest pain.   She notes onset of chest pain yesterday, described as a pressure in her central lower chest which occurs intermittently lasting 10-20 minutes.  She is unable to reliably report a pattern with exertion or food or other potential inciting factors.  She notes an associated feeling of nausea, a feeling of heaviness in her head, and feeling "feverish".  She took Tylenol for her pain.  She does have a history of GERD but states this  pain is different in character.  She notes similar pain which led to admission 3 years ago.  She denies increased leg swelling, does note dyspnea on exertion which she has at baseline but may have worsened recently.  Of note, she has thrombocytosis and was recently seen by hematology who suspect essential thrombocytosis and initiated anagrelide, also on daily aspirin. She is not currently experiencing chest pain.   Past Medical History:  Diagnosis Date  . CHF (congestive heart failure) ([facility])   . Hypertension   . Pre-diabetes   . Thrombocytosis ([facility])   . Thyroid disease     Patient Active Problem List   Diagnosis Date Noted  . Essential thrombocytosis ([facility]) 04/08/2018  . Constipation 07/12/2014  . Vomiting 07/12/2014  . Essential hypertension, benign 07/12/2014  . Hypokalemia 07/12/2014  . Chest pain 07/11/2014  . Hypertensive urgency 07/11/2014  . Hypothyroidism 07/11/2014    Past Surgical History:  Procedure Laterality Date  . CHOLECYSTECTOMY       OB History   None      Home Medications    Prior to Admission medications   Medication Sig Start Date End Date  Taking? Authorizing Provider  acetaminophen (TYLENOL) 325 MG tablet Take 650 mg by mouth every 6 (six) hours as needed for moderate pain or headache.   Yes [provider]  amLODipine (NORVASC) 10 MG tablet TK 1 T PO QD 03/21/18  Yes [provider]  anagrelide (AGRYLIN) 0.5 MG capsule Take 1 capsule (0.5 mg total) by mouth daily. 04/08/18  Yes Nicholas Lose, MD  aspirin EC 81 MG tablet Take 1 tablet (81 mg total) by mouth daily. 07/13/14  Yes Hongalgi, Lenis Dickinson, MD  folic acid (FOLVITE) 1 MG tablet TK 1 T PO QD 04/09/18  Yes [provider]  hydrochlorothiazide (HYDRODIURIL) 25 MG tablet Take 25 mg by mouth every morning.   Yes [provider]  levothyroxine (SYNTHROID) 100 MCG tablet Take 1 tablet (100 mcg total) by mouth daily before breakfast. 08/06/14  Yes Jahmya Onofrio Mo, MD  primidone (MYSOLINE) 50 MG tablet Take 50 mg by mouth at bedtime. 04/11/18  Yes [provider]  polyethylene glycol (MIRALAX / GLYCOLAX) packet Take 17 g by mouth daily. Patient not taking: Reported on 04/08/2018 07/13/14   Modena Jansky, MD  potassium chloride (KLOR-CON) 20 MEQ packet Take 40 mEq by mouth 2 (two) times daily. Patient not taking: Reported on 04/08/2018 08/17/14   Melony Overly, MD  senna (SENOKOT) 8.6 MG TABS tablet Take 2 tablets (17.2 mg total) by mouth daily as needed for moderate constipation. Patient not taking: Reported on 04/08/2018 07/13/14  Modena Jansky, MD    Family History History reviewed. No pertinent family history.  Social History Social History   Tobacco Use  . Smoking status: Never Smoker  . Smokeless tobacco: Never Used  Substance Use Topics  . Alcohol use: No  . Drug use: Not on file     Allergies   Shellfish allergy   Review of Systems Review of Systems  Constitutional: Positive for diaphoresis. Negative for fever.  Respiratory: Positive for shortness of breath.   Cardiovascular: Positive for chest pain. Negative for leg  swelling.  Gastrointestinal: Positive for nausea.     Physical Exam Updated Vital Signs BP (!) 157/95   Pulse (!) 59   Resp 20   Ht 5\' 2"  (1.575 m)   Wt 71.7 kg (158 lb)   SpO2 96%   BMI 28.90 kg/m   General: Elderly female resting on ED stretcher comfortably, no acute distress Head: Normocephalic, atraumatic Eyes: PERRL, EOMI, arcus senilis  ENT: Moist mucus membranes  CV: Slightly bradycardic, regular rhythm, s1, s2  Resp: Clear breath sounds without crackles, normal work of breathing, no distress  Abd: Soft, +BS, non-tender  Extr: No LE edema  Neuro: Alert and oriented, 5/5 upper and lower extremity strength, no facial asymmetry  Skin: Warm, dry      ED Treatments / Results  Labs (all labs ordered are listed, but only abnormal results are displayed) Labs Reviewed  BASIC METABOLIC PANEL - Abnormal; Notable for the following components:      Result Value   Chloride 97 (*)    Glucose, Bld 128 (*)    BUN 23 (*)    Creatinine, Ser 1.06 (*)    GFR calc non Af Amer 48 (*)    GFR calc Af Amer 56 (*)    All other components within normal limits  CBC - Abnormal; Notable for the following components:   Hemoglobin 11.8 (*)    MCH 25.9 (*)    RDW 16.6 (*)    Platelets 1,160 (*)    All other components within normal limits  PATHOLOGIST SMEAR REVIEW  TROPONIN I  TROPONIN I  TROPONIN I  I-STAT TROPONIN, ED    EKG EKG Interpretation  Date/Time:  Thursday April 18 2018 19:37:01 EDT Ventricular Rate:  73 PR Interval:    QRS Duration: 117 QT Interval:  440 QTC Calculation: 485 R Axis:   32 Text Interpretation:  Sinus rhythm Ventricular premature complex Nonspecific intraventricular conduction delay Nonspecific T abnormalities, anterior leads No STEMI.  Confirmed by Nanda Quinton [phone]) on 04/18/2018 8:36:08 PM Also confirmed by Nanda Quinton [phone])  on 04/18/2018 9:24:00 PM   Radiology Dg Chest 2 View  Result Date: 04/18/2018 CLINICAL DATA:  Intermittent chest pain  for 1 day.  History of CHF. EXAM: CHEST - 2 VIEW COMPARISON:  Chest radiograph July 11, 2014 FINDINGS: Stable cardiomegaly. Tortuous, potentially ectatic aorta. Similar fullness of the pulmonary hila compatible with vascular shadows. RIGHT lung base strandy densities. Increased lung volumes with flattened hemidiaphragms. No pleural effusion or focal consolidation. No pneumothorax. Soft tissue planes and included osseous structures are non suspicious. IMPRESSION: Stable cardiomegaly and pulmonary hyperinflation. RIGHT lung base atelectasis/scarring. Electronically Signed   By: Elon Alas M.D.   On: 04/18/2018 20:34    Procedures Procedures (including critical care time)  Medications Ordered in ED Medications  aspirin tablet 325 mg (has no administration in time range)     Initial Impression / Assessment and Plan / ED Course  I  have reviewed the triage vital signs and the nursing notes.  Pertinent labs & imaging results that were available during my care of the patient were reviewed by me and considered in my medical decision making (see chart for details).  82 year old female with a history of HTN, suspected essential thrombocytosis presenting with intermittent chest pain with concerning features given nausea, potential diaphoresis, and vague feeling in head. Previously admitted in 2015 with negative nuclear stress test at that time. She reports a history of HF, though echo in 2015 showed normal EF with only grade 1 DD. Does not appear volume overloaded on exam or imaging. I-stat troponin negative, BMP similar to prior. CBC with similar anemia to prior and known thrombocytosis of 1,160. She was recently started on anagrelide by hematology for suspected essential thrombocytosis. A side effect of this medication may be chest pain, however given historical features of pain, pt age, and thrombocytosis (outpt heme note indicates potential increased risk of thrombosis w/ plt>1k) will contact  hospitalist for observation admission.   Pt was initially hesitant for admission, but upon explaining the concerning features of her pain and preference for an observation admission, she was agreeable. However, when speaking with the hospitalist, the patient had become more adamant that she would not like to be admitted. Her and her daughter expressed a plan to attend her echo in the morning and follow up with her PCP or cardiologist for results if further evaluation was needed. They preferred to sign out ama rather than be admitted. Return precautions were reviewed and provided.   Final Clinical Impressions(s) / ED Diagnoses   Final diagnoses:  Precordial chest pain    ED Discharge Orders    None       Tawny Asal, MD 04/18/18 2256    Tawny Asal, MD 04/18/18 2314    Margette Fast, MD 04/19/18 [phone]

## 2018-04-19 DIAGNOSIS — I10 Essential (primary) hypertension: Secondary | ICD-10-CM | POA: Diagnosis not present

## 2018-04-19 DIAGNOSIS — R0602 Shortness of breath: Secondary | ICD-10-CM | POA: Diagnosis not present

## 2018-04-19 DIAGNOSIS — R9431 Abnormal electrocardiogram [ECG] [EKG]: Secondary | ICD-10-CM | POA: Diagnosis not present

## 2018-04-19 NOTE — Progress Notes (Addendum)
Hospitalist called to admit patient for chest pain at 10.41pm, 04/18/18. Admission orders already placed. Patient told me they would rather leave as patient already has an Echo scheduled for tomorrow and initial evaluation and work up in the Ed have so far been unremarkable. I explained why admission has being recommended and the risk of leaving AMA which could be fatal in case of a heart attack or blood clot in lungs. Patients daughter says 3 yrs ago- 2015 admission for chest pain evaluation and work-up was also negative.  Patient daughter says she lives nearby and can bring her mother back to the ED if needed. Patient and her daughter are electing to rather follow-up with her cardiologist outpatient tomorrow, and choose to leave even if it means signing out AMA.  Talked to ED provider - Resident, Dr. Ellyn Hack who communicated with his supervising physician Dr. Laverta [place] about patient's wishes.  Patient left AMA.  Arlyce Dice, MD [facility]. 2.01a.m, 04/19/2018   LOS- No charge.

## 2018-04-22 LAB — PATHOLOGIST SMEAR REVIEW

## 2018-04-23 ENCOUNTER — Inpatient Hospital Stay (HOSPITAL_BASED_OUTPATIENT_CLINIC_OR_DEPARTMENT_OTHER): Payer: Medicare Other | Admitting: Hematology and Oncology

## 2018-04-23 ENCOUNTER — Inpatient Hospital Stay: Payer: Medicare Other

## 2018-04-23 ENCOUNTER — Telehealth: Payer: Self-pay | Admitting: Hematology and Oncology

## 2018-04-23 DIAGNOSIS — D473 Essential (hemorrhagic) thrombocythemia: Secondary | ICD-10-CM | POA: Diagnosis not present

## 2018-04-23 DIAGNOSIS — Z79899 Other long term (current) drug therapy: Secondary | ICD-10-CM

## 2018-04-23 DIAGNOSIS — Z9049 Acquired absence of other specified parts of digestive tract: Secondary | ICD-10-CM

## 2018-04-23 DIAGNOSIS — I10 Essential (primary) hypertension: Secondary | ICD-10-CM

## 2018-04-23 DIAGNOSIS — Z7982 Long term (current) use of aspirin: Secondary | ICD-10-CM | POA: Diagnosis not present

## 2018-04-23 DIAGNOSIS — E079 Disorder of thyroid, unspecified: Secondary | ICD-10-CM | POA: Diagnosis not present

## 2018-04-23 LAB — CBC WITH DIFFERENTIAL (CANCER CENTER ONLY)
BASOS ABS: 0.1 10*3/uL (ref 0.0–0.1)
BASOS PCT: 1 %
Eosinophils Absolute: 0.4 10*3/uL (ref 0.0–0.5)
Eosinophils Relative: 5 %
HEMATOCRIT: 37.3 % (ref 34.8–46.6)
HEMOGLOBIN: 12.1 g/dL (ref 11.6–15.9)
Lymphocytes Relative: 26 %
Lymphs Abs: 2.2 10*3/uL (ref 0.9–3.3)
MCH: 25.4 pg (ref 25.1–34.0)
MCHC: 32.3 g/dL (ref 31.5–36.0)
MCV: 78.4 fL — ABNORMAL LOW (ref 79.5–101.0)
MONOS PCT: 11 %
Monocytes Absolute: 1 10*3/uL — ABNORMAL HIGH (ref 0.1–0.9)
NEUTROS ABS: 4.8 10*3/uL (ref 1.5–6.5)
NEUTROS PCT: 57 %
Platelet Count: 1097 10*3/uL (ref 145–400)
RBC: 4.76 MIL/uL (ref 3.70–5.45)
RDW: 16.7 % — ABNORMAL HIGH (ref 11.2–14.5)
WBC Count: 8.5 10*3/uL (ref 3.9–10.3)

## 2018-04-23 LAB — BCR ABL1 FISH (GENPATH)

## 2018-04-23 LAB — JAK2 (INCLUDING V617F AND EXON 12), MPL,& CALR W/RFL MPN PANEL (NGS)

## 2018-04-23 MED ORDER — ANAGRELIDE HCL 0.5 MG PO CAPS: 0.5000 mg | ORAL_CAPSULE | Freq: Two times a day (BID) | ORAL | 3 refills | Status: DC

## 2018-04-23 NOTE — Assessment & Plan Note (Signed)
Lab review 04/08/2018: Platelet count 1074, rest of the CBC is normal BCR/ABL: Negative for CML JAK2 mutation analysis pending  Current treatment: Anagrelide 0.5 mg daily started 04/08/2018: Lab review 04/23/2018:  Return to clinic in 2 months for follow-up and labs

## 2018-04-23 NOTE — Telephone Encounter (Signed)
Gave patient avs and calendar of upcoming July appointments.  °

## 2018-04-23 NOTE — Progress Notes (Signed)
Patient Care Team: Benito Mccreedy, MD as PCP - General (Internal Medicine)  DIAGNOSIS:  Encounter Diagnosis  Name Primary?  . Essential thrombocytosis (HCC)     CHIEF COMPLIANT: Follow-up on anagrelide therapy  INTERVAL HISTORY: [name] is a 82 year old with above-mentioned history of markedly elevated platelet count with blood work consistent with essential thrombocytosis.  She is currently on anagrelide and appears to be tolerating it very well.  She is here to recheck her blood counts and follow-up.  She does not have any side effects from anagrelide.  She does have tinnitus but it is not related to anagrelide.  REVIEW OF SYSTEMS:   Constitutional: Denies fevers, chills or abnormal weight loss Eyes: Denies blurriness of vision Ears, nose, mouth, throat, and face: Denies mucositis or sore throat Respiratory: Denies cough, dyspnea or wheezes Cardiovascular: Denies palpitation, chest discomfort Gastrointestinal:  Denies nausea, heartburn or change in bowel habits Skin: Denies abnormal skin rashes Lymphatics: Denies new lymphadenopathy or easy bruising Neurological:Denies numbness, tingling or new weaknesses Behavioral/Psych: Mood is stable, no new changes  Extremities: No lower extremity edema All other systems were reviewed with the patient and are negative.  I have reviewed the past medical history, past surgical history, social history and family history with the patient and they are unchanged from previous note.  ALLERGIES:  is allergic to shellfish allergy.  MEDICATIONS:  Current Outpatient Medications  Medication Sig Dispense Refill  . acetaminophen (TYLENOL) 325 MG tablet Take 650 mg by mouth every 6 (six) hours as needed for moderate pain or headache.    Marland Kitchen amLODipine (NORVASC) 10 MG tablet TK 1 T PO QD  1  . anagrelide (AGRYLIN) 0.5 MG capsule Take 1 capsule (0.5 mg total) by mouth 2 (two) times daily. 60 capsule 3  . aspirin EC 81 MG tablet Take 1 tablet (81  mg total) by mouth daily. 30 tablet 0  . folic acid (FOLVITE) 1 MG tablet TK 1 T PO QD  5  . hydrochlorothiazide (HYDRODIURIL) 25 MG tablet Take 25 mg by mouth every morning.    Marland Kitchen levothyroxine (SYNTHROID) 100 MCG tablet Take 1 tablet (100 mcg total) by mouth daily before breakfast. 30 tablet 3  . polyethylene glycol (MIRALAX / GLYCOLAX) packet Take 17 g by mouth daily. (Patient not taking: Reported on 04/08/2018) 30 each 0  . potassium chloride (KLOR-CON) 20 MEQ packet Take 40 mEq by mouth 2 (two) times daily. (Patient not taking: Reported on 04/08/2018) 120 tablet 3  . primidone (MYSOLINE) 50 MG tablet Take 50 mg by mouth at bedtime.  3  . senna (SENOKOT) 8.6 MG TABS tablet Take 2 tablets (17.2 mg total) by mouth daily as needed for moderate constipation. (Patient not taking: Reported on 04/08/2018) 30 each 0   No current facility-administered medications for this visit.     PHYSICAL EXAMINATION: ECOG PERFORMANCE STATUS: 1 - Symptomatic but completely ambulatory  Vitals:   04/23/18 1114  BP: 125/73  Pulse: 78  Resp: 16  Temp: 97.8 F (36.6 C)  SpO2: 96%   Filed Weights   04/23/18 1114  Weight: 160 lb 11.2 oz (72.9 kg)    GENERAL:alert, no distress and comfortable SKIN: skin color, texture, turgor are normal, no rashes or significant lesions EYES: normal, Conjunctiva are pink and non-injected, sclera clear OROPHARYNX:no exudate, no erythema and lips, buccal mucosa, and tongue normal  NECK: supple, thyroid normal size, non-tender, without nodularity LYMPH:  no palpable lymphadenopathy in the cervical, axillary or inguinal LUNGS: clear to  auscultation and percussion with normal breathing effort HEART: regular rate & rhythm and no murmurs and no lower extremity edema ABDOMEN:abdomen soft, non-tender and normal bowel sounds MUSCULOSKELETAL:no cyanosis of digits and no clubbing  NEURO: alert & oriented x 3 with fluent speech, no focal motor/sensory deficits EXTREMITIES: No lower  extremity edema  LABORATORY DATA:  I have reviewed the data as listed CMP Latest Ref Rng & Units 04/18/2018 08/17/2014 08/06/2014  Glucose 65 - 99 mg/dL 128(H) 101(H) 156(H)  BUN 6 - 20 mg/dL 23(H) 19 20  Creatinine 0.44 - 1.00 mg/dL 1.06(H) 1.00 1.00  Sodium 135 - 145 mmol/L 138 141 140  Potassium 3.5 - 5.1 mmol/L 3.7 3.2(L) 2.9(LL)  Chloride 101 - 111 mmol/L 97(L) 98 98  CO2 22 - 32 mmol/L 32 - -  Calcium 8.9 - 10.3 mg/dL 9.6 - -  Total Protein 6.0 - 8.3 g/dL - - -  Total Bilirubin 0.3 - 1.2 mg/dL - - -  Alkaline Phos 39 - 117 U/L - - -  AST 0 - 37 U/L - - -  ALT 0 - 35 U/L - - -    Lab Results  Component Value Date   WBC 8.5 04/23/2018   HGB 12.1 04/23/2018   HCT 37.3 04/23/2018   MCV 78.4 (L) 04/23/2018   PLT 1,097 (HH) 04/23/2018   NEUTROABS 4.8 04/23/2018    ASSESSMENT & PLAN:  Essential thrombocytosis ([place]) Lab review 04/08/2018: Platelet count 1074, rest of the CBC is normal BCR/ABL: Negative for CML JAK2 mutation analysis pending  Current treatment: Anagrelide 0.5 mg daily started 04/08/2018: Lab review 04/23/2018: Platelet count 1097 (stable compared to prior lab) We would like to increase the dosage of anagrelide to 0.5 mg twice a day  Return to clinic in 1 months for follow-up and labs    Orders Placed This Encounter  Procedures  . CBC with Differential (Cancer Center Only)    Standing Status:   Future    Standing Expiration Date:   04/24/2019   The patient has a good understanding of the overall plan. she agrees with it. she will call with any problems that may develop before the next visit here.   Harriette Ohara, MD 04/23/18

## 2018-04-25 DIAGNOSIS — G250 Essential tremor: Secondary | ICD-10-CM | POA: Diagnosis not present

## 2018-04-30 ENCOUNTER — Encounter: Payer: Medicare Other | Attending: Internal Medicine | Admitting: Registered"

## 2018-04-30 ENCOUNTER — Encounter: Payer: Self-pay | Admitting: Registered"

## 2018-04-30 DIAGNOSIS — E119 Type 2 diabetes mellitus without complications: Secondary | ICD-10-CM | POA: Insufficient documentation

## 2018-04-30 DIAGNOSIS — E669 Obesity, unspecified: Secondary | ICD-10-CM | POA: Insufficient documentation

## 2018-04-30 DIAGNOSIS — Z713 Dietary counseling and surveillance: Secondary | ICD-10-CM | POA: Insufficient documentation

## 2018-04-30 DIAGNOSIS — E039 Hypothyroidism, unspecified: Secondary | ICD-10-CM | POA: Insufficient documentation

## 2018-04-30 DIAGNOSIS — E785 Hyperlipidemia, unspecified: Secondary | ICD-10-CM | POA: Diagnosis not present

## 2018-04-30 DIAGNOSIS — I10 Essential (primary) hypertension: Secondary | ICD-10-CM | POA: Insufficient documentation

## 2018-04-30 NOTE — Patient Instructions (Signed)
Continue to have balanced meals and snacks. Watch portion size of carbohydrates and include protein when eating carbohydrates. Specific foods which you need to watch portion size and be sure to eat as part of a balanced meal are plantains, rice, bread. Consider drinking water instead of juice. Be sure to have your doctor check your A1c regularly, at least once a year.

## 2018-04-30 NOTE — Progress Notes (Signed)
Diabetes Self-Management Education  Visit Type: First/Initial  Appt. Start Time: 1400 Appt. End Time: 2202  04/30/2018  [name], identified by name and date of birth, is a 82 y.o. female with a diagnosis of Diabetes: Type 2.   This patient is accompanied in the office by her child.  ASSESSMENT Pt states she has pre-diabetes, but the referral diagnosis includes newly diagnosed diabetes with an A1c of 6.8%. RD is not sure if the patient has been back to her doctor after the last A1c was completed to receive this information from her PCP.  Patient's daughter helped with explaining what her mother was trying to convey about types of foods she eats and amounts. From the discussion it appears that juice, plantains and fruit were prevalent in her diet and patient appears will to make some changes which should make a positive impact on her blood sugar control.  Diabetes Self-Management Education - 04/30/18 1409      Visit Information   Visit Type  First/Initial      Initial Visit   Diabetes Type  Type 2    Are you currently following a meal plan?  No    Are you taking your medications as prescribed?  Not on Medications      Health Coping   How would you rate your overall health?  Good      Psychosocial Assessment   Patient Belief/Attitude about Diabetes  Motivated to manage diabetes    How often do you need to have someone help you when you read instructions, pamphlets, or other written materials from your doctor or pharmacy?  3 - Sometimes    What is the last grade level you completed in school?  high school      Complications   Last HgB A1C per patient/outside source  6.8 % per referral lab 04/09/18    How often do you check your blood sugar?  0 times/day (not testing)    Have you had a dilated eye exam in the past 12 months?  No    Have you had a dental exam in the past 12 months?  No    Are you checking your feet?  Yes    How many days per week are you checking your feet?  2       Dietary Intake   Breakfast  bread, tomato, fruit OR cream of wheat, 2% milk, fruit    Snack (morning)  bananas    Lunch  nuts, tea, OR hot chocolate    Snack (afternoon)  juice    Dinner  rice, vegetable, meat (often has fish) OR plantain    Beverage(s)  water, tea, fruit juice blends      Exercise   Exercise Type  Light (walking / raking leaves)    How many days per week to you exercise?  3    How many minutes per day do you exercise?  30    Total minutes per week of exercise  90      Patient Education   Previous Diabetes Education  No    Nutrition management   Role of diet in the treatment of diabetes and the relationship between the three main macronutrients and blood glucose level    Monitoring  Identified appropriate SMBG and/or A1C goals.      Individualized Goals (developed by patient)   Nutrition  General guidelines for healthy choices and portions discussed      Outcomes   Expected Outcomes  Demonstrated  interest in learning. Expect positive outcomes    Future DMSE  PRN    Program Status  Completed      Individualized Plan for Diabetes Self-Management Training:   Learning Objective:  Patient will have a greater understanding of diabetes self-management. Patient education plan is to attend individual and/or group sessions per assessed needs and concerns.  Patient Instructions  Continue to have balanced meals and snacks. Watch portion size of carbohydrates and include protein when eating carbohydrates. Specific foods which you need to watch portion size and be sure to eat as part of a balanced meal are plantains, rice, bread. Consider drinking water instead of juice. Be sure to have your doctor check your A1c regularly, at least once a year.   Expected Outcomes:  Demonstrated interest in learning. Expect positive outcomes  Education material provided: A1C conversion sheet and Meal plan card  If problems or questions, patient to contact team via:   Phone  Future DSME appointment: PRN

## 2018-05-02 NOTE — Progress Notes (Signed)
[facility] Note  05/03/2018     CHIEF COMPLAINT Patient presents for Retina Evaluation and Diabetic Eye Exam   HISTORY OF PRESENT ILLNESS: [name] is a 82 y.o. female who presents to the clinic today for:   HPI    Retina Evaluation    In both eyes.  Associated Symptoms Flashes and Floaters.  Negative for Distortion, Redness, Trauma, Shoulder/Hip pain, Fatigue, Weight Loss, Jaw Claudication, Glare, Pain, Blind Spot, Photophobia, Scalp Tenderness and Fever.  Context:  distance vision, mid-range vision and near vision.  Treatments tried include no treatments.  I, the attending physician,  performed the HPI with the patient and updated documentation appropriately.          Diabetic Eye Exam    Vision is stable.  Associated Symptoms Negative for Distortion, Redness, Trauma, Shoulder/Hip pain, Fatigue, Weight Loss, Jaw Claudication, Glare, Pain, Floaters, Flashes, Blind Spot, Photophobia, Scalp Tenderness and Fever.  Diabetes characteristics include Type 2 and controlled with diet.  This started 1 month ago.  Blood sugar level is controlled.  Last Blood Glucose 125.  Last A1C 6.8.  I, the attending physician,  performed the HPI with the patient and updated documentation appropriately.          Comments    Referral of Dr. Frances Furbish for retina/Dm evaluation. Patient states last week she felt as if there was sand in os then the next day both eyes felt that way. Pt reports she has floaters,flashes and tearing Ou for the last year.Pt  reports she was told she was prediabetic last week, Bs 125 this am, Last A1C 6.8, BS controlled by diet.Denies gtt's/vit's        Last edited by Bernarda Caffey, MD on 05/07/2018 12:48 PM. (History)    Pt states she was sent by her PCP, Pt states she does not take any DM medications; Pt endorses dx of HTN; Pt states she has noticed "something dark" OU, pt states it happens off an on;   Referring physician: Benito Mccreedy,  MD [address] [place], [place] [address]  HISTORICAL INFORMATION:   Selected notes from the MEDICAL RECORD NUMBER Referred by Dr. Iona Beard Osei-Bonsu for DM exam LEE:  Ocular Hx- PMH-DM (A1C: 6.8, not taking meds), HTN,     CURRENT MEDICATIONS: No current outpatient medications on file. (Ophthalmic Drugs)   No current facility-administered medications for this visit.  (Ophthalmic Drugs)   Current Outpatient Medications (Other)  Medication Sig  . acetaminophen (TYLENOL) 325 MG tablet Take 650 mg by mouth every 6 (six) hours as needed for moderate pain or headache.  Marland Kitchen amLODipine (NORVASC) 10 MG tablet TK 1 T PO QD  . anagrelide (AGRYLIN) 0.5 MG capsule Take 1 capsule (0.5 mg total) by mouth 2 (two) times daily.  Marland Kitchen aspirin EC 81 MG tablet Take 1 tablet (81 mg total) by mouth daily.  . folic acid (FOLVITE) 1 MG tablet TK 1 T PO QD  . hydrochlorothiazide (HYDRODIURIL) 25 MG tablet Take 25 mg by mouth every morning.  Marland Kitchen levothyroxine (SYNTHROID) 100 MCG tablet Take 1 tablet (100 mcg total) by mouth daily before breakfast.  . polyethylene glycol (MIRALAX / GLYCOLAX) packet Take 17 g by mouth daily.  . potassium chloride (KLOR-CON) 20 MEQ packet Take 40 mEq by mouth 2 (two) times daily.  . primidone (MYSOLINE) 50 MG tablet Take 50 mg by mouth at bedtime.  . senna (SENOKOT) 8.6 MG TABS tablet Take 2 tablets (17.2  mg total) by mouth daily as needed for moderate constipation.   No current facility-administered medications for this visit.  (Other)      REVIEW OF SYSTEMS: ROS    Positive for: Neurological, Endocrine, Cardiovascular, Eyes, Respiratory   Negative for: Constitutional, Gastrointestinal, Skin, Genitourinary, Musculoskeletal, HENT, Psychiatric, Allergic/Imm, Heme/Lymph   Last edited by Zenovia Jordan, LPN on 1/61/0960  4:54 PM. (History)       ALLERGIES Allergies  Allergen Reactions  . Shellfish Allergy Hives    PAST MEDICAL HISTORY Past Medical History:   Diagnosis Date  . CHF (congestive heart failure) ([place])   . Hypertension   . Pre-diabetes   . Thrombocytosis ([place])   . Thyroid disease    Past Surgical History:  Procedure Laterality Date  . CHOLECYSTECTOMY      FAMILY HISTORY History reviewed. No pertinent family history.  SOCIAL HISTORY Social History   Tobacco Use  . Smoking status: Never Smoker  . Smokeless tobacco: Never Used  Substance Use Topics  . Alcohol use: No  . Drug use: Not on file         OPHTHALMIC EXAM:  Base Eye Exam    Visual Acuity (Snellen - Linear)      Right Left   Dist [place] 20/40 -2 20/50   Dist ph [place] NI 20/40 -2       Tonometry (Tonopen, 1:33 PM)      Right Left   Pressure 21 20       Pupils      Dark Light Shape React APD   Right 4 3 Round Slow None   Left 4 3 Round Slow None       Visual Fields (Counting fingers)      Left Right    Full Full       Extraocular Movement      Right Left    Full, Ortho Full, Ortho       Neuro/Psych    Oriented x3:  Yes   Mood/Affect:  Normal       Dilation    Both eyes:  1.0% Mydriacyl, 2.5% Phenylephrine @ 1:33 PM        Slit Lamp and Fundus Exam    Slit Lamp Exam      Right Left   Lids/Lashes Dermatochalasis - upper lid Dermatochalasis - upper lid   Conjunctiva/Sclera Nasal and temporal Pinguecula, Melanosis Nasal and temporal Pinguecula, Melanosis   Cornea Arcus Arcus   Anterior Chamber Deep and quiet Deep and quiet   Iris Round and dilated, No NVI Round and dilated, No NVI   Lens 2-3+ Nuclear sclerosis, 3+ Cortical cataract, Vacuoles 2-3+ Nuclear sclerosis, 3+ Cortical cataract, Vacuoles   Vitreous Vitreous syneresis Vitreous syneresis, Posterior vitreous detachment       Fundus Exam      Right Left   Disc Pink and Sharp Pink and Sharp   C/D Ratio 0.3 0.4   Macula Flat, Blunted foveal reflex, Retinal pigment epithelial mottling, rare Drusen,  Blunted foveal reflex, Retinal pigment epithelial mottling, rare Drusen, No heme  or edema   Vessels Mild Vascular attenuation Vascular attenuation   Periphery Attached, small CHRPE spanning 0500 to 0600 midzone Attached        Refraction    Manifest Refraction      Sphere Cylinder Axis Dist VA   Right -2.00 +1.25 165 20/40+2   Left -2.25 +2.00 180 20/40          IMAGING AND PROCEDURES  Imaging and  Procedures for @TODAY @  OCT, Retina - OU - Both Eyes       Right Eye Quality was good. Central Foveal Thickness: 195. Progression has no prior data. Findings include normal foveal contour, no IRF, no SRF (Diffuse retinal thinning, trace ERM, patchy ORA).   Left Eye Quality was good. Central Foveal Thickness: 195. Progression has no prior data. Findings include normal foveal contour, no IRF, no SRF, outer retinal atrophy, retinal drusen  (Diffuse retinal thinning, trace ERM).   Notes *Images captured and stored on drive  Diagnosis / Impression:  Diffuse retinal thinning OU; No DME  Clinical management:  See below  Abbreviations: NFP - Normal foveal profile. CME - cystoid macular edema. PED - pigment epithelial detachment. IRF - intraretinal fluid. SRF - subretinal fluid. EZ - ellipsoid zone. ERM - epiretinal membrane. ORA - outer retinal atrophy. ORT - outer retinal tubulation. SRHM - subretinal hyper-reflective material                  ASSESSMENT/PLAN:    ICD-10-CM   1. Diabetes mellitus type 2 without retinopathy ([place]) E11.9   2. 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 Hypertensive retinopathy of both eyes H35.033   4. Essential hypertension I10   5. Retinal edema H35.81 OCT, Retina - OU - Both Eyes  6. Combined forms of age-related cataract of both eyes H25.813   7. Dry eyes H04.123     1. Diabetes mellitus, type 2 without retinopathy - The incidence, risk factors for progression, natural history and treatment options for diabetic retinopathy  were discussed with patient.   - The need for close monitoring of  blood glucose, blood pressure, and serum lipids, avoiding cigarette or any type of tobacco, and the need for long term follow up was also discussed with patient. - f/u in 1 year, sooner prn  2. CHRPE OS-  - small bank of flat pigmented lesions -- midzone 5-6 oclock - monitor  3, 4. Hypertensive retinopathy OU - discussed importance of tight BP control - monitor  5. No retinal edema on exam or OCT  6. Combined form age-related cataract OU-  - The symptoms of cataract, surgical options, and treatments and risks were discussed with patient. - discussed diagnosis and progression - approaching visual significance - pt defers referral to cataract surgeon, wishes tomonitor for now  7. DES OU-  - recommend artificial tears OU QID-prn   Ophthalmic Meds Ordered this visit:  No orders of the defined types were placed in this encounter.      Return in about 1 year (around 05/04/2019) for DM exam, DFE, OCT.  There are no Patient Instructions on file for this visit.   This document serves as a record of services personally performed by Gardiner Sleeper, MD, PhD. It was created on their behalf by Ernest Mallick, OA, an ophthalmic assistant. The creation of this record is the provider's dictation and/or activities during the visit.    Electronically signed by: Ernest Mallick, OA  06.27.2019 12:51 PM   This document serves as a record of services personally performed by Gardiner Sleeper, MD, PhD. It was created on their behalf by Catha Brow, [place], a certified ophthalmic assistant. The creation of this record is the provider's dictation and/or activities during the visit.  Electronically signed by: Catha Brow, [place]  06.28.19 12:51 PM     Explained the diagnoses, plan, and follow up with the patient and they expressed understanding.  Patient expressed understanding  of the importance of proper follow up care.   Gardiner Sleeper, M.D., Ph.D. Diseases & Surgery of the Retina and  Vitreous [facility]   I have reviewed the above documentation for accuracy and completeness, and I agree with the above. Gardiner Sleeper, M.D., Ph.D. 05/07/18 12:52 PM      Abbreviations: M myopia (nearsighted); A astigmatism; H hyperopia (farsighted); P presbyopia; Mrx spectacle prescription;  CTL contact lenses; OD right eye; OS left eye; OU both eyes  XT exotropia; ET esotropia; PEK punctate epithelial keratitis; PEE punctate epithelial erosions; DES dry eye syndrome; MGD meibomian gland dysfunction; ATs artificial tears; PFAT's preservative free artificial tears; [place] nuclear sclerotic cataract; PSC posterior subcapsular cataract; ERM epi-retinal membrane; PVD posterior vitreous detachment; RD retinal detachment; DM diabetes mellitus; DR diabetic retinopathy; NPDR non-proliferative diabetic retinopathy; PDR proliferative diabetic retinopathy; CSME clinically significant macular edema; DME diabetic macular edema; dbh dot blot hemorrhages; CWS cotton wool spot; POAG primary open angle glaucoma; C/D cup-to-disc ratio; HVF humphrey visual field; GVF goldmann visual field; OCT optical coherence tomography; IOP intraocular pressure; BRVO Branch retinal vein occlusion; CRVO central retinal vein occlusion; CRAO central retinal artery occlusion; BRAO branch retinal artery occlusion; RT retinal tear; SB scleral buckle; PPV pars plana vitrectomy; VH Vitreous hemorrhage; PRP panretinal laser photocoagulation; IVK intravitreal kenalog; VMT vitreomacular traction; MH Macular hole;  NVD neovascularization of the disc; NVE neovascularization elsewhere; AREDS age related eye disease study; ARMD age related macular degeneration; POAG primary open angle glaucoma; EBMD epithelial/anterior basement membrane dystrophy; ACIOL anterior chamber intraocular lens; IOL intraocular lens; PCIOL posterior chamber intraocular lens; Phaco/IOL phacoemulsification with intraocular lens placement; Oxly  photorefractive keratectomy; LASIK laser assisted in situ keratomileusis; HTN hypertension; DM diabetes mellitus; COPD chronic obstructive pulmonary disease

## 2018-05-03 ENCOUNTER — Encounter (INDEPENDENT_AMBULATORY_CARE_PROVIDER_SITE_OTHER): Payer: Self-pay | Admitting: Ophthalmology

## 2018-05-03 ENCOUNTER — Ambulatory Visit (INDEPENDENT_AMBULATORY_CARE_PROVIDER_SITE_OTHER): Payer: Medicare Other | Admitting: Ophthalmology

## 2018-05-03 DIAGNOSIS — H25813 Combined forms of age-related cataract, bilateral: Secondary | ICD-10-CM | POA: Diagnosis not present

## 2018-05-03 DIAGNOSIS — E785 Hyperlipidemia, unspecified: Secondary | ICD-10-CM | POA: Diagnosis not present

## 2018-05-03 DIAGNOSIS — E039 Hypothyroidism, unspecified: Secondary | ICD-10-CM | POA: Diagnosis not present

## 2018-05-03 DIAGNOSIS — K1120 Sialoadenitis, unspecified: Secondary | ICD-10-CM | POA: Diagnosis not present

## 2018-05-03 DIAGNOSIS — E7211 Homocystinuria: Secondary | ICD-10-CM | POA: Diagnosis not present

## 2018-05-03 DIAGNOSIS — E119 Type 2 diabetes mellitus without complications: Secondary | ICD-10-CM

## 2018-05-03 DIAGNOSIS — H3581 Retinal edema: Secondary | ICD-10-CM | POA: Diagnosis not present

## 2018-05-03 DIAGNOSIS — E669 Obesity, unspecified: Secondary | ICD-10-CM | POA: Diagnosis not present

## 2018-05-03 DIAGNOSIS — H35033 Hypertensive retinopathy, bilateral: Secondary | ICD-10-CM | POA: Diagnosis not present

## 2018-05-03 DIAGNOSIS — D473 Essential (hemorrhagic) thrombocythemia: Secondary | ICD-10-CM | POA: Diagnosis not present

## 2018-05-03 DIAGNOSIS — I10 Essential (primary) hypertension: Secondary | ICD-10-CM | POA: Diagnosis not present

## 2018-05-03 DIAGNOSIS — H04123 Dry eye syndrome of bilateral lacrimal glands: Secondary | ICD-10-CM

## 2018-05-03 DIAGNOSIS — E1165 Type 2 diabetes mellitus with hyperglycemia: Secondary | ICD-10-CM | POA: Diagnosis not present

## 2018-05-03 DIAGNOSIS — Q141 Congenital malformation of retina: Secondary | ICD-10-CM | POA: Diagnosis not present

## 2018-05-06 DIAGNOSIS — K1120 Sialoadenitis, unspecified: Secondary | ICD-10-CM | POA: Diagnosis not present

## 2018-05-06 DIAGNOSIS — D473 Essential (hemorrhagic) thrombocythemia: Secondary | ICD-10-CM | POA: Diagnosis not present

## 2018-05-06 DIAGNOSIS — E669 Obesity, unspecified: Secondary | ICD-10-CM | POA: Diagnosis not present

## 2018-05-06 DIAGNOSIS — E039 Hypothyroidism, unspecified: Secondary | ICD-10-CM | POA: Diagnosis not present

## 2018-05-06 DIAGNOSIS — E1165 Type 2 diabetes mellitus with hyperglycemia: Secondary | ICD-10-CM | POA: Diagnosis not present

## 2018-05-06 DIAGNOSIS — I10 Essential (primary) hypertension: Secondary | ICD-10-CM | POA: Diagnosis not present

## 2018-05-06 DIAGNOSIS — E7211 Homocystinuria: Secondary | ICD-10-CM | POA: Diagnosis not present

## 2018-05-06 DIAGNOSIS — E785 Hyperlipidemia, unspecified: Secondary | ICD-10-CM | POA: Diagnosis not present

## 2018-05-07 ENCOUNTER — Encounter (INDEPENDENT_AMBULATORY_CARE_PROVIDER_SITE_OTHER): Payer: Self-pay | Admitting: Ophthalmology

## 2018-05-10 ENCOUNTER — Telehealth: Payer: Self-pay

## 2018-05-10 NOTE — Telephone Encounter (Signed)
Returned call from Kalman Shan -patient's daughter [phone] in [place]- we have permission to speak with her.  She reports that patient needs to reschedule office visit and lab appt with Dr Lindi Adie that was on July 15th due to she is going out of town.  Changed appt to July 9th arrive at 1:15 pm- she is going to contact the other sister [name] who will be with pt that day and she will discuss with Dr Lindi Adie what medications pt may need early refill since going out of town for a little while. No other needs per pt at this time.

## 2018-05-14 ENCOUNTER — Inpatient Hospital Stay: Payer: Medicare Other

## 2018-05-14 ENCOUNTER — Inpatient Hospital Stay: Payer: Medicare Other | Attending: Hematology and Oncology | Admitting: Hematology and Oncology

## 2018-05-14 DIAGNOSIS — Z79899 Other long term (current) drug therapy: Secondary | ICD-10-CM | POA: Diagnosis not present

## 2018-05-14 DIAGNOSIS — D473 Essential (hemorrhagic) thrombocythemia: Secondary | ICD-10-CM | POA: Insufficient documentation

## 2018-05-14 DIAGNOSIS — Z7982 Long term (current) use of aspirin: Secondary | ICD-10-CM | POA: Insufficient documentation

## 2018-05-14 LAB — CBC WITH DIFFERENTIAL (CANCER CENTER ONLY)
Basophils Absolute: 0.1 10*3/uL (ref 0.0–0.1)
Basophils Relative: 1 %
Eosinophils Absolute: 0.3 10*3/uL (ref 0.0–0.5)
Eosinophils Relative: 5 %
HEMATOCRIT: 37.7 % (ref 34.8–46.6)
HEMOGLOBIN: 12.3 g/dL (ref 11.6–15.9)
LYMPHS ABS: 2.2 10*3/uL (ref 0.9–3.3)
LYMPHS PCT: 29 %
MCH: 25.5 pg (ref 25.1–34.0)
MCHC: 32.6 g/dL (ref 31.5–36.0)
MCV: 78.2 fL — AB (ref 79.5–101.0)
MONOS PCT: 10 %
Monocytes Absolute: 0.8 10*3/uL (ref 0.1–0.9)
NEUTROS ABS: 4.2 10*3/uL (ref 1.5–6.5)
NEUTROS PCT: 55 %
Platelet Count: 588 10*3/uL — ABNORMAL HIGH (ref 145–400)
RBC: 4.82 MIL/uL (ref 3.70–5.45)
RDW: 16.8 % — ABNORMAL HIGH (ref 11.2–14.5)
WBC: 7.6 10*3/uL (ref 3.9–10.3)

## 2018-05-14 MED ORDER — ANAGRELIDE HCL 0.5 MG PO CAPS: 0.5000 mg | ORAL_CAPSULE | Freq: Two times a day (BID) | ORAL | 1 refills | Status: DC

## 2018-05-14 NOTE — Assessment & Plan Note (Signed)
Lab review  Platelet count 1074, rest of the CBC is normal BCR/ABL: Negative for CML JAK2 mutation analysis pending  Current treatment: Anagrelide 0.5 mg daily started 04/08/2018: Increased to Bid Lab review 05/14/2018:  Return to clinic in 3 months for follow-up and labs

## 2018-05-14 NOTE — Progress Notes (Signed)
Patient Care Team: Benito Mccreedy, MD as PCP - General (Internal Medicine)  DIAGNOSIS:  Encounter Diagnosis  Name Primary?  . Essential thrombocytosis (HCC)     CHIEF COMPLIANT: Follow-up on anagrelide  INTERVAL HISTORY: [name] is a 82 year old with above-mentioned history of essential thrombocytosis who is currently on anagrelide 0.5 mg twice daily.  She is here for blood work checkup.  She reports no new problems or concerns.  She has not had any side effects from the treatment.  She has had some nausea issues but she does not attributed to anagrelide.  REVIEW OF SYSTEMS:   Constitutional: Denies fevers, chills or abnormal weight loss Eyes: Denies blurriness of vision Ears, nose, mouth, throat, and face: Denies mucositis or sore throat Respiratory: Denies cough, dyspnea or wheezes Cardiovascular: Denies palpitation, chest discomfort Gastrointestinal:  Denies nausea, heartburn or change in bowel habits Skin: Denies abnormal skin rashes Lymphatics: Denies new lymphadenopathy or easy bruising Neurological:Denies numbness, tingling or new weaknesses Behavioral/Psych: Mood is stable, no new changes  Extremities: No lower extremity edema  All other systems were reviewed with the patient and are negative.  I have reviewed the past medical history, past surgical history, social history and family history with the patient and they are unchanged from previous note.  ALLERGIES:  is allergic to shellfish allergy.  MEDICATIONS:  Current Outpatient Medications  Medication Sig Dispense Refill  . acetaminophen (TYLENOL) 325 MG tablet Take 650 mg by mouth every 6 (six) hours as needed for moderate pain or headache.    Marland Kitchen amLODipine (NORVASC) 10 MG tablet TK 1 T PO QD  1  . anagrelide (AGRYLIN) 0.5 MG capsule Take 1 capsule (0.5 mg total) by mouth 2 (two) times daily. 360 capsule 1  . aspirin EC 81 MG tablet Take 1 tablet (81 mg total) by mouth daily. 30 tablet 0  . folic acid  (FOLVITE) 1 MG tablet TK 1 T PO QD  5  . hydrochlorothiazide (HYDRODIURIL) 25 MG tablet Take 25 mg by mouth every morning.    Marland Kitchen levothyroxine (SYNTHROID) 100 MCG tablet Take 1 tablet (100 mcg total) by mouth daily before breakfast. 30 tablet 3  . polyethylene glycol (MIRALAX / GLYCOLAX) packet Take 17 g by mouth daily. 30 each 0  . potassium chloride (KLOR-CON) 20 MEQ packet Take 40 mEq by mouth 2 (two) times daily. 120 tablet 3  . primidone (MYSOLINE) 50 MG tablet Take 50 mg by mouth at bedtime.  3  . senna (SENOKOT) 8.6 MG TABS tablet Take 2 tablets (17.2 mg total) by mouth daily as needed for moderate constipation. 30 each 0   No current facility-administered medications for this visit.     PHYSICAL EXAMINATION: ECOG PERFORMANCE STATUS: 1 - Symptomatic but completely ambulatory  Vitals:   05/14/18 1338  BP: 132/82  Pulse: 70  Resp: 17  Temp: 98.4 F (36.9 C)  SpO2: 96%   Filed Weights   05/14/18 1338  Weight: 155 lb 4.8 oz (70.4 kg)    GENERAL:alert, no distress and comfortable SKIN: skin color, texture, turgor are normal, no rashes or significant lesions EYES: normal, Conjunctiva are pink and non-injected, sclera clear OROPHARYNX:no exudate, no erythema and lips, buccal mucosa, and tongue normal  NECK: supple, thyroid normal size, non-tender, without nodularity LYMPH:  no palpable lymphadenopathy in the cervical, axillary or inguinal LUNGS: clear to auscultation and percussion with normal breathing effort HEART: regular rate & rhythm and no murmurs and no lower extremity edema ABDOMEN:abdomen soft, non-tender and  normal bowel sounds MUSCULOSKELETAL:no cyanosis of digits and no clubbing  NEURO: alert & oriented x 3 with fluent speech, no focal motor/sensory deficits EXTREMITIES: No lower extremity edema   LABORATORY DATA:  I have reviewed the data as listed CMP Latest Ref Rng & Units 04/18/2018 08/17/2014 08/06/2014  Glucose 65 - 99 mg/dL 128(H) 101(H) 156(H)  BUN 6 - 20  mg/dL 23(H) 19 20  Creatinine 0.44 - 1.00 mg/dL 1.06(H) 1.00 1.00  Sodium 135 - 145 mmol/L 138 141 140  Potassium 3.5 - 5.1 mmol/L 3.7 3.2(L) 2.9(LL)  Chloride 101 - 111 mmol/L 97(L) 98 98  CO2 22 - 32 mmol/L 32 - -  Calcium 8.9 - 10.3 mg/dL 9.6 - -  Total Protein 6.0 - 8.3 g/dL - - -  Total Bilirubin 0.3 - 1.2 mg/dL - - -  Alkaline Phos 39 - 117 U/L - - -  AST 0 - 37 U/L - - -  ALT 0 - 35 U/L - - -    Lab Results  Component Value Date   WBC 7.6 05/14/2018   HGB 12.3 05/14/2018   HCT 37.7 05/14/2018   MCV 78.2 (L) 05/14/2018   PLT 588 (H) 05/14/2018   NEUTROABS 4.2 05/14/2018    ASSESSMENT & PLAN:  Essential thrombocytosis ([place]) Lab review  Platelet count 1074, rest of the CBC is normal BCR/ABL: Negative for CML JAK2 mutation analysis pending  Current treatment: Anagrelide 0.5 mg daily started 04/08/2018: Increased to Bid Lab review 05/14/2018: Platelet count 588 Market improvement in the platelet count. Patient is going to [place] for 6 months. I gave her prescription for 6 months of anagrelide. I will see her back as soon as she returns from [place]. She will have blood work in [place] in about 3 months time.   No orders of the defined types were placed in this encounter.  The patient has a good understanding of the overall plan. she agrees with it. she will call with any problems that may develop before the next visit here.   Harriette Ohara, MD 05/14/18

## 2018-05-20 ENCOUNTER — Other Ambulatory Visit: Payer: Medicare Other

## 2018-05-20 ENCOUNTER — Ambulatory Visit: Payer: Medicare Other | Admitting: Hematology and Oncology

## 2018-07-15 ENCOUNTER — Other Ambulatory Visit: Payer: Self-pay | Admitting: Hematology and Oncology

## 2018-08-11 ENCOUNTER — Other Ambulatory Visit: Payer: Self-pay | Admitting: Hematology and Oncology

## 2018-09-07 ENCOUNTER — Other Ambulatory Visit: Payer: Self-pay | Admitting: Hematology and Oncology

## 2018-11-26 DIAGNOSIS — E1165 Type 2 diabetes mellitus with hyperglycemia: Secondary | ICD-10-CM | POA: Diagnosis not present

## 2018-11-26 DIAGNOSIS — E669 Obesity, unspecified: Secondary | ICD-10-CM | POA: Diagnosis not present

## 2018-11-26 DIAGNOSIS — E782 Mixed hyperlipidemia: Secondary | ICD-10-CM | POA: Diagnosis not present

## 2018-11-26 DIAGNOSIS — E7211 Homocystinuria: Secondary | ICD-10-CM | POA: Diagnosis not present

## 2018-11-26 DIAGNOSIS — H538 Other visual disturbances: Secondary | ICD-10-CM | POA: Diagnosis not present

## 2018-11-26 DIAGNOSIS — E039 Hypothyroidism, unspecified: Secondary | ICD-10-CM | POA: Diagnosis not present

## 2018-11-26 DIAGNOSIS — I10 Essential (primary) hypertension: Secondary | ICD-10-CM | POA: Diagnosis not present

## 2018-11-26 DIAGNOSIS — Z01118 Encounter for examination of ears and hearing with other abnormal findings: Secondary | ICD-10-CM | POA: Diagnosis not present

## 2018-11-29 ENCOUNTER — Other Ambulatory Visit: Payer: Self-pay

## 2018-11-29 DIAGNOSIS — D473 Essential (hemorrhagic) thrombocythemia: Secondary | ICD-10-CM

## 2018-12-02 ENCOUNTER — Inpatient Hospital Stay: Payer: Medicare Other

## 2018-12-02 ENCOUNTER — Telehealth: Payer: Self-pay | Admitting: Hematology and Oncology

## 2018-12-02 ENCOUNTER — Inpatient Hospital Stay (HOSPITAL_BASED_OUTPATIENT_CLINIC_OR_DEPARTMENT_OTHER): Payer: Medicare Other | Admitting: Hematology and Oncology

## 2018-12-02 ENCOUNTER — Inpatient Hospital Stay: Payer: Medicare Other | Attending: Hematology and Oncology

## 2018-12-02 ENCOUNTER — Other Ambulatory Visit: Payer: Self-pay

## 2018-12-02 DIAGNOSIS — D473 Essential (hemorrhagic) thrombocythemia: Secondary | ICD-10-CM | POA: Diagnosis not present

## 2018-12-02 DIAGNOSIS — Z7982 Long term (current) use of aspirin: Secondary | ICD-10-CM | POA: Diagnosis not present

## 2018-12-02 DIAGNOSIS — Z79899 Other long term (current) drug therapy: Secondary | ICD-10-CM

## 2018-12-02 LAB — CBC WITH DIFFERENTIAL (CANCER CENTER ONLY)
ABS IMMATURE GRANULOCYTES: 0 10*3/uL (ref 0.00–0.07)
ABS IMMATURE GRANULOCYTES: 0.03 10*3/uL (ref 0.00–0.07)
Basophils Absolute: 0.1 10*3/uL (ref 0.0–0.1)
Basophils Absolute: 0.1 10*3/uL (ref 0.0–0.1)
Basophils Relative: 1 %
Basophils Relative: 1 %
EOS PCT: 6 %
EOS PCT: 4 %
Eosinophils Absolute: 0.6 10*3/uL — ABNORMAL HIGH (ref 0.0–0.5)
Eosinophils Absolute: 0.4 10*3/uL (ref 0.0–0.5)
HEMATOCRIT: 37.4 % (ref 36.0–46.0)
HEMATOCRIT: 36.1 % (ref 36.0–46.0)
HEMOGLOBIN: 11.7 g/dL — AB (ref 12.0–15.0)
HEMOGLOBIN: 11.5 g/dL — AB (ref 12.0–15.0)
Immature Granulocytes: 0 %
LYMPHS ABS: 3.4 10*3/uL (ref 0.7–4.0)
LYMPHS ABS: 2.7 10*3/uL (ref 0.7–4.0)
LYMPHS PCT: 37 %
LYMPHS PCT: 30 %
MCH: 26 pg (ref 26.0–34.0)
MCH: 26.1 pg (ref 26.0–34.0)
MCHC: 31.3 g/dL (ref 30.0–36.0)
MCHC: 31.9 g/dL (ref 30.0–36.0)
MCV: 83.1 fL (ref 80.0–100.0)
MCV: 82 fL (ref 80.0–100.0)
MONO ABS: 0.6 10*3/uL (ref 0.1–1.0)
MONO ABS: 0.9 10*3/uL (ref 0.1–1.0)
MONOS PCT: 6 %
Monocytes Relative: 10 %
NEUTROS ABS: 4.6 10*3/uL (ref 1.7–17.7)
NEUTROS ABS: 5 10*3/uL (ref 1.7–7.7)
Neutrophils Relative %: 50 %
Neutrophils Relative %: 55 %
Platelet Count: 1033 10*3/uL — ABNORMAL HIGH (ref 150–400)
RBC: 4.5 MIL/uL (ref 3.87–5.11)
RBC: 4.4 MIL/uL (ref 3.87–5.11)
RDW: 16.8 % — ABNORMAL HIGH (ref 11.5–15.5)
RDW: 16.6 % — ABNORMAL HIGH (ref 11.5–15.5)
Smear Review: 1
WBC: 9.2 10*3/uL (ref 4.0–10.5)
WBC: 9 10*3/uL (ref 4.0–10.5)
nRBC: 0 % (ref 0.0–0.2)
nRBC: 0 % (ref 0.0–0.2)

## 2018-12-02 LAB — CMP (CANCER CENTER ONLY)
ALBUMIN: 3.8 g/dL (ref 3.5–5.0)
ALT: 13 U/L (ref 0–44)
AST: 21 U/L (ref 15–41)
Alkaline Phosphatase: 81 U/L (ref 38–126)
Anion gap: 11 (ref 5–15)
BUN: 21 mg/dL (ref 8–23)
CHLORIDE: 98 mmol/L (ref 98–111)
CO2: 30 mmol/L (ref 22–32)
CREATININE: 1.23 mg/dL — AB (ref 0.44–1.00)
Calcium: 9.9 mg/dL (ref 8.9–10.3)
GFR, EST NON AFRICAN AMERICAN: 41 mL/min — AB (ref >60)
GFR, Est AFR Am: 48 mL/min — ABNORMAL LOW (ref >60)
GLUCOSE: 138 mg/dL — AB (ref 70–99)
POTASSIUM: 3.7 mmol/L (ref 3.5–5.1)
Sodium: 139 mmol/L (ref 135–145)
Total Bilirubin: 0.7 mg/dL (ref 0.3–1.2)
Total Protein: 9.1 g/dL — ABNORMAL HIGH (ref 6.5–8.1)

## 2018-12-02 LAB — PLATELET BY CITRATE

## 2018-12-02 MED ORDER — ANAGRELIDE HCL 0.5 MG PO CAPS: 0.5000 mg | ORAL_CAPSULE | Freq: Two times a day (BID) | ORAL | 1 refills | Status: DC

## 2018-12-02 NOTE — Telephone Encounter (Signed)
Gave avs and calendar ° °

## 2018-12-02 NOTE — Progress Notes (Signed)
Updated order per Dr. Lindi Adie

## 2018-12-02 NOTE — Assessment & Plan Note (Signed)
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 Patient went to [place] for 6 months. I gave her prescription for 6 months of anagrelide.  Return to clinic in 6 months with labs

## 2018-12-02 NOTE — Progress Notes (Signed)
Patient Care Team: Benito Mccreedy, MD as PCP - General (Internal Medicine)  DIAGNOSIS:  Encounter Diagnosis  Name Primary?  . 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 twice daily.  She responded to this treatment and then she went back to her home country of [place] and came back for follow-up.  Even though she tells me that she has been compliant her daughter thinks that she has not been taking it as prescribed and she may be only taking it once a day instead of twice a day.  She has not had any side effects to anagrelide.  Denies any bleeding or clotting symptoms.  REVIEW OF SYSTEMS:   Constitutional: Denies fevers, chills or abnormal weight loss Eyes: Denies blurriness of vision Ears, nose, mouth, throat, and face: Denies mucositis or sore throat Respiratory: Denies cough, dyspnea or wheezes Cardiovascular: Denies palpitation, chest discomfort Gastrointestinal:  Denies nausea, heartburn or change in bowel habits Skin: Denies abnormal skin rashes Lymphatics: Denies new lymphadenopathy or easy bruising Neurological:Denies numbness, tingling or new weaknesses Behavioral/Psych: Mood is stable, no new changes  Extremities: No lower extremity edema   All other systems were reviewed with the patient and are negative.  I have reviewed the past medical history, past surgical history, social history and family history with the patient and they are unchanged from previous note.  ALLERGIES:  is allergic to shellfish allergy.  MEDICATIONS:  Current Outpatient Medications  Medication Sig Dispense Refill  . acetaminophen (TYLENOL) 325 MG tablet Take 650 mg by mouth every 6 (six) hours as needed for moderate pain or headache.    Marland Kitchen amLODipine (NORVASC)  10 MG tablet TK 1 T PO QD  1  . anagrelide (AGRYLIN) 0.5 MG capsule Take 1 capsule (0.5 mg total) by mouth 2 (two) times daily. 60 capsule 1  . aspirin EC 81 MG tablet Take 1 tablet (81 mg total) by mouth daily. 30 tablet 0  . folic acid (FOLVITE) 1 MG tablet TK 1 T PO QD  5  . hydrochlorothiazide (HYDRODIURIL) 25 MG tablet Take 25 mg by mouth every morning.    Marland Kitchen levothyroxine (SYNTHROID) 100 MCG tablet Take 1 tablet (100 mcg total) by mouth daily before breakfast. 30 tablet 3  . polyethylene glycol (MIRALAX / GLYCOLAX) packet Take 17 g by mouth daily. 30 each 0  . potassium chloride (KLOR-CON) 20 MEQ packet Take 40 mEq by mouth 2 (two) times daily. 120 tablet 3  . primidone (MYSOLINE) 50 MG tablet Take 50 mg by mouth at bedtime.  3  . senna (SENOKOT) 8.6 MG TABS tablet Take 2 tablets (17.2 mg total) by mouth daily as needed for moderate constipation. 30 each 0   No current facility-administered medications for this visit.     PHYSICAL EXAMINATION: ECOG PERFORMANCE STATUS: 1 - Symptomatic but completely ambulatory  Vitals:   12/02/18 1322  BP: 133/85  Pulse: 84  Resp: 17  Temp: 98.2 F (36.8 C)  SpO2: 99%   Filed Weights   12/02/18 1322  Weight: 152 lb (68.9 kg)    GENERAL:alert, no distress and comfortable SKIN: skin color, texture, turgor are normal, no rashes or significant lesions EYES: normal, Conjunctiva are pink and non-injected, sclera clear OROPHARYNX:no exudate, no erythema and lips, buccal mucosa, and tongue normal  NECK: supple,  thyroid normal size, non-tender, without nodularity LYMPH:  no palpable lymphadenopathy in the cervical, axillary or inguinal LUNGS: clear to auscultation and percussion with normal breathing effort HEART: regular rate & rhythm and no murmurs and no lower extremity edema ABDOMEN:abdomen soft, non-tender and normal bowel sounds MUSCULOSKELETAL:no cyanosis of digits and no clubbing  NEURO: alert & oriented x 3 with fluent speech, no focal  motor/sensory deficits EXTREMITIES: No lower extremity edema    LABORATORY DATA:  I have reviewed the data as listed CMP Latest Ref Rng & Units 12/02/2018 04/18/2018 08/17/2014  Glucose 70 - 99 mg/dL 138(H) 128(H) 101(H)  BUN 8 - 23 mg/dL 21 23(H) 19  Creatinine 0.44 - 1.00 mg/dL 1.23(H) 1.06(H) 1.00  Sodium 135 - 145 mmol/L 139 138 141  Potassium 3.5 - 5.1 mmol/L 3.7 3.7 3.2(L)  Chloride 98 - 111 mmol/L 98 97(L) 98  CO2 22 - 32 mmol/L 30 32 -  Calcium 8.9 - 10.3 mg/dL 9.9 9.6 -  Total Protein 6.5 - 8.1 g/dL 9.1(H) - -  Total Bilirubin 0.3 - 1.2 mg/dL 0.7 - -  Alkaline Phos 38 - 126 U/L 81 - -  AST 15 - 41 U/L 21 - -  ALT 0 - 44 U/L 13 - -    Lab Results  Component Value Date   WBC 9.0 12/02/2018   HGB 11.5 (L) 12/02/2018   HCT 36.1 12/02/2018   MCV 82.0 12/02/2018   PLT 1,033 (H) 12/02/2018   NEUTROABS 5.0 12/02/2018    ASSESSMENT & PLAN:  Essential thrombocytosis ([place]) Lab review  Platelet count 1074, rest of the CBC is normal BCR/ABL: Negative for CML 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 I suspect noncompliance is the reason for the rise in the platelet count. I instructed them to follow instructions for another month and come back for recheck. I sent a new prescription to her pharmacy.  Return to clinic in 1 month with labs    Orders Placed This Encounter  Procedures  . CBC with Differential (Cancer Center Only)    Blue top tube    Standing Status:   Future    Number of Occurrences:   1    Standing Expiration Date:   12/03/2019  . CBC with Differential (Cancer Center Only)    Blue top tube    Standing Status:   Standing    Number of Occurrences:   10    Standing Expiration Date:   12/03/2019   The patient has a good understanding of the overall plan. she  agrees with it. she will call with any problems that may develop before the next visit here.   Harriette Ohara, MD 12/02/18

## 2018-12-17 DIAGNOSIS — E039 Hypothyroidism, unspecified: Secondary | ICD-10-CM | POA: Diagnosis not present

## 2018-12-17 DIAGNOSIS — E782 Mixed hyperlipidemia: Secondary | ICD-10-CM | POA: Diagnosis not present

## 2018-12-17 DIAGNOSIS — E669 Obesity, unspecified: Secondary | ICD-10-CM | POA: Diagnosis not present

## 2018-12-17 DIAGNOSIS — I10 Essential (primary) hypertension: Secondary | ICD-10-CM | POA: Diagnosis not present

## 2018-12-17 DIAGNOSIS — E7211 Homocystinuria: Secondary | ICD-10-CM | POA: Diagnosis not present

## 2018-12-17 DIAGNOSIS — E1165 Type 2 diabetes mellitus with hyperglycemia: Secondary | ICD-10-CM | POA: Diagnosis not present

## 2018-12-30 ENCOUNTER — Telehealth: Payer: Self-pay | Admitting: Hematology and Oncology

## 2018-12-30 ENCOUNTER — Inpatient Hospital Stay: Payer: Medicare Other | Attending: Hematology and Oncology

## 2018-12-30 ENCOUNTER — Inpatient Hospital Stay (HOSPITAL_BASED_OUTPATIENT_CLINIC_OR_DEPARTMENT_OTHER): Payer: Medicare Other | Admitting: Hematology and Oncology

## 2018-12-30 DIAGNOSIS — Z79899 Other long term (current) drug therapy: Secondary | ICD-10-CM

## 2018-12-30 DIAGNOSIS — Z7982 Long term (current) use of aspirin: Secondary | ICD-10-CM | POA: Diagnosis not present

## 2018-12-30 DIAGNOSIS — D473 Essential (hemorrhagic) thrombocythemia: Secondary | ICD-10-CM

## 2018-12-30 LAB — CBC WITH DIFFERENTIAL (CANCER CENTER ONLY)
Abs Immature Granulocytes: 0.03 10*3/uL (ref 0.00–0.07)
BASOS ABS: 0.1 10*3/uL (ref 0.0–0.1)
BASOS PCT: 1 %
EOS ABS: 0.5 10*3/uL (ref 0.0–0.5)
Eosinophils Relative: 5 %
HCT: 34.3 % — ABNORMAL LOW (ref 36.0–46.0)
Hemoglobin: 10.7 g/dL — ABNORMAL LOW (ref 12.0–15.0)
Immature Granulocytes: 0 %
Lymphocytes Relative: 27 %
Lymphs Abs: 2.4 10*3/uL (ref 0.7–4.0)
MCH: 25.4 pg — ABNORMAL LOW (ref 26.0–34.0)
MCHC: 31.2 g/dL (ref 30.0–36.0)
MCV: 81.5 fL (ref 80.0–100.0)
Monocytes Absolute: 1.1 10*3/uL — ABNORMAL HIGH (ref 0.1–1.0)
Monocytes Relative: 13 %
NEUTROS PCT: 54 %
NRBC: 0 % (ref 0.0–0.2)
Neutro Abs: 4.7 10*3/uL (ref 1.7–7.7)
PLATELETS: 587 10*3/uL — AB (ref 150–400)
RBC: 4.21 MIL/uL (ref 3.87–5.11)
RDW: 16.5 % — AB (ref 11.5–15.5)
WBC: 8.8 10*3/uL (ref 4.0–10.5)

## 2018-12-30 MED ORDER — ANAGRELIDE HCL 0.5 MG PO CAPS: 0.5000 mg | ORAL_CAPSULE | Freq: Two times a day (BID) | ORAL | 1 refills | Status: DC

## 2018-12-30 MED FILL — ANAGRELIDE HCL 0.5 MG CAP: 0.5 | 30 days supply | Qty: 60 | Fill #0

## 2018-12-30 NOTE — Progress Notes (Signed)
Patient Care Team: Benito Mccreedy, MD as PCP - General (Internal Medicine)  DIAGNOSIS:  Encounter Diagnosis  Name Primary?  . Essential thrombocytosis (HCC)    Current treatment: Anagrelide 0.5 mg bid CHIEF COMPLIANT: Follow-up on anagrelide therapy  INTERVAL HISTORY: [name] is a 83 year old with above-mentioned history of essential thrombocytosis who went to [place] her home country and only took once a day anagrelide and came in with 1000 platelets.  Increase the dosage of anagrelide to twice a day and that her platelet count has improved significantly.  She is here with her daughter. She plans to go back to [place].  REVIEW OF SYSTEMS:   Constitutional: Denies fevers, chills or abnormal weight loss Eyes: Denies blurriness of vision Ears, nose, mouth, throat, and face: Denies mucositis or sore throat Respiratory: Denies cough, dyspnea or wheezes Cardiovascular: Denies palpitation, chest discomfort Gastrointestinal:  Denies nausea, heartburn or change in bowel habits Skin: Denies abnormal skin rashes Lymphatics: Denies new lymphadenopathy or easy bruising Neurological: Tremors and generalized weakness Behavioral/Psych: Mood is stable, no new changes  Extremities: No lower extremity edema All other systems were reviewed with the patient and are negative.  I have reviewed the past medical history, past surgical history, social history and family history with the patient and they are unchanged from previous note.  ALLERGIES:  is allergic to shellfish allergy.  MEDICATIONS:  Current Outpatient Medications  Medication Sig Dispense Refill  . acetaminophen (TYLENOL) 325 MG tablet Take 650 mg by mouth every 6 (six) hours as needed for moderate pain or headache.    Marland Kitchen amLODipine (NORVASC) 10 MG tablet TK 1 T PO QD  1  . anagrelide (AGRYLIN) 0.5 MG capsule Take 1 capsule (0.5 mg total) by mouth 2 (two) times daily. 60 capsule 1  . aspirin EC 81 MG tablet Take 1 tablet (81 mg  total) by mouth daily. 30 tablet 0  . folic acid (FOLVITE) 1 MG tablet TK 1 T PO QD  5  . hydrochlorothiazide (HYDRODIURIL) 25 MG tablet Take 25 mg by mouth every morning.    Marland Kitchen levothyroxine (SYNTHROID) 100 MCG tablet Take 1 tablet (100 mcg total) by mouth daily before breakfast. 30 tablet 3  . polyethylene glycol (MIRALAX / GLYCOLAX) packet Take 17 g by mouth daily. 30 each 0  . potassium chloride (KLOR-CON) 20 MEQ packet Take 40 mEq by mouth 2 (two) times daily. 120 tablet 3  . primidone (MYSOLINE) 50 MG tablet Take 50 mg by mouth at bedtime.  3  . senna (SENOKOT) 8.6 MG TABS tablet Take 2 tablets (17.2 mg total) by mouth daily as needed for moderate constipation. 30 each 0   No current facility-administered medications for this visit.     PHYSICAL EXAMINATION: ECOG PERFORMANCE STATUS: 1 - Symptomatic but completely ambulatory  Vitals:   12/30/18 1353  BP: (!) 155/87  Pulse: 73  Resp: 20  Temp: 98.9 F (37.2 C)  SpO2: 96%   Filed Weights   12/30/18 1353  Weight: 149 lb 14.4 oz (68 kg)    GENERAL:alert, no distress and comfortable SKIN: skin color, texture, turgor are normal, no rashes or significant lesions EYES: normal, Conjunctiva are pink and non-injected, sclera clear OROPHARYNX:no exudate, no erythema and lips, buccal mucosa, and tongue normal  NECK: supple, thyroid normal size, non-tender, without nodularity LYMPH:  no palpable lymphadenopathy in the cervical, axillary or inguinal LUNGS: clear to auscultation and percussion with normal breathing effort HEART: regular rate & rhythm and no murmurs and no  lower extremity edema ABDOMEN:abdomen soft, non-tender and normal bowel sounds MUSCULOSKELETAL:no cyanosis of digits and no clubbing  NEURO: alert & oriented x 3 with fluent speech, no focal motor/sensory deficits EXTREMITIES: No lower extremity edema   LABORATORY DATA:  I have reviewed the data as listed CMP Latest Ref Rng & Units 12/02/2018 04/18/2018 08/17/2014    Glucose 70 - 99 mg/dL 138(H) 128(H) 101(H)  BUN 8 - 23 mg/dL 21 23(H) 19  Creatinine 0.44 - 1.00 mg/dL 1.23(H) 1.06(H) 1.00  Sodium 135 - 145 mmol/L 139 138 141  Potassium 3.5 - 5.1 mmol/L 3.7 3.7 3.2(L)  Chloride 98 - 111 mmol/L 98 97(L) 98  CO2 22 - 32 mmol/L 30 32 -  Calcium 8.9 - 10.3 mg/dL 9.9 9.6 -  Total Protein 6.5 - 8.1 g/dL 9.1(H) - -  Total Bilirubin 0.3 - 1.2 mg/dL 0.7 - -  Alkaline Phos 38 - 126 U/L 81 - -  AST 15 - 41 U/L 21 - -  ALT 0 - 44 U/L 13 - -    Lab Results  Component Value Date   WBC 8.8 12/30/2018   HGB 10.7 (L) 12/30/2018   HCT 34.3 (L) 12/30/2018   MCV 81.5 12/30/2018   PLT 587 (H) 12/30/2018   NEUTROABS 4.7 12/30/2018    ASSESSMENT & PLAN:  Essential thrombocytosis (HCC) Lab review  BCR/ABL: Negative for CML 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  Lab review 05/14/2018:Platelet count 588 12/02/2018: Platelet count 1033 12/30/2018: Platelet count: 587  Patient went to [place] for 6 months. Since she increase the dosage of anagrelide to twice a day she is doing much better with improvement in the platelet count.  However she wants to go back to [place] next month and may spend between 6 months to a year. She tells me that she has not been able to get the prescription filled at [facility] and would like to see it for [facility] pharmacy is able to fill the prescription for 6 months every time. I sent a new prescription to the pharmacy.  Patient is not going to be back to see [place] for at least a year.  I will see her when she comes back in 1 year with labs.    No orders of the defined types were placed in this encounter.  The patient has a good understanding of the overall plan. she agrees with it. she will call with any problems that may develop before the next visit here.   Harriette Ohara, MD 12/30/18

## 2018-12-30 NOTE — Telephone Encounter (Signed)
Gave avs and calendar ° °

## 2018-12-30 NOTE — Assessment & Plan Note (Signed)
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  Lab review 05/14/2018:Platelet count 588 12/02/2018: Platelet count 1033 12/30/2018: Platelet count  Patient went to [place] for 6 months. I suspect noncompliance is the reason for the rise in the platelet count.  Return to clinic in 3 months with labs

## 2019-01-09 DIAGNOSIS — R609 Edema, unspecified: Secondary | ICD-10-CM | POA: Diagnosis not present

## 2019-01-09 DIAGNOSIS — E1165 Type 2 diabetes mellitus with hyperglycemia: Secondary | ICD-10-CM | POA: Diagnosis not present

## 2019-01-09 DIAGNOSIS — E782 Mixed hyperlipidemia: Secondary | ICD-10-CM | POA: Diagnosis not present

## 2019-01-09 DIAGNOSIS — E7211 Homocystinuria: Secondary | ICD-10-CM | POA: Diagnosis not present

## 2019-01-09 DIAGNOSIS — E669 Obesity, unspecified: Secondary | ICD-10-CM | POA: Diagnosis not present

## 2019-01-09 DIAGNOSIS — I10 Essential (primary) hypertension: Secondary | ICD-10-CM | POA: Diagnosis not present

## 2019-01-09 DIAGNOSIS — R0609 Other forms of dyspnea: Secondary | ICD-10-CM | POA: Diagnosis not present

## 2019-01-09 DIAGNOSIS — E039 Hypothyroidism, unspecified: Secondary | ICD-10-CM | POA: Diagnosis not present

## 2019-01-14 DIAGNOSIS — G250 Essential tremor: Secondary | ICD-10-CM | POA: Diagnosis not present

## 2019-01-17 NOTE — Progress Notes (Deleted)
Subjective:   [name], female    DOB: 1936-03-30, 83 y.o.   MRN: [id number]  No chief complaint on file.    Patient referred by Benito Mccreedy for ***  HPI:***  PCP note: Pt reports elevated BP in the evening/night to the 720N systolic, associated headaches, fatigue and lower extremity swelling. Also SOB with exertion.   Past Medical History:  Diagnosis Date  . CHF (congestive heart failure) ([place])   . Hypertension   . Pre-diabetes   . Thrombocytosis ([place])   . Thyroid disease     Past Surgical History:  Procedure Laterality Date  . CHOLECYSTECTOMY      No family history on file.  Social History   Socioeconomic History  . Marital status: Widowed    Spouse name: Not on file  . Number of children: Not on file  . Years of education: Not on file  . Highest education level: Not on file  Occupational History  . Not on file  Social Needs  . Financial resource strain: Not on file  . Food insecurity:    Worry: Not on file    Inability: Not on file  . Transportation needs:    Medical: Not on file    Non-medical: Not on file  Tobacco Use  . Smoking status: Never Smoker  . Smokeless tobacco: Never Used  Substance and Sexual Activity  . Alcohol use: No  . Drug use: Not on file  . Sexual activity: Never  Lifestyle  . Physical activity:    Days per week: Not on file    Minutes per session: Not on file  . Stress: Not on file  Relationships  . Social connections:    Talks on phone: Not on file    Gets together: Not on file    Attends religious service: Not on file    Active member of club or organization: Not on file    Attends meetings of clubs or organizations: Not on file    Relationship status: Not on file  . Intimate partner violence:    Fear of current or ex partner: Not on file    Emotionally abused: Not on file    Physically abused: Not on file    Forced sexual activity: Not on file  Other Topics Concern  . Not on file  Social History  Narrative  . Not on file    Current Outpatient Medications on File Prior to Visit  Medication Sig Dispense Refill  . acetaminophen (TYLENOL) 325 MG tablet Take 650 mg by mouth every 6 (six) hours as needed for moderate pain or headache.    Marland Kitchen amLODipine (NORVASC) 10 MG tablet TK 1 T PO QD  1  . anagrelide (AGRYLIN) 0.5 MG capsule Take 1 capsule (0.5 mg total) by mouth 2 (two) times daily. 360 capsule 1  . aspirin EC 81 MG tablet Take 1 tablet (81 mg total) by mouth daily. 30 tablet 0  . folic acid (FOLVITE) 1 MG tablet TK 1 T PO QD  5  . hydrochlorothiazide (HYDRODIURIL) 25 MG tablet Take 25 mg by mouth every morning.    Marland Kitchen levothyroxine (SYNTHROID) 100 MCG tablet Take 1 tablet (100 mcg total) by mouth daily before breakfast. 30 tablet 3  . polyethylene glycol (MIRALAX / GLYCOLAX) packet Take 17 g by mouth daily. 30 each 0  . potassium chloride (KLOR-CON) 20 MEQ packet Take 40 mEq by mouth 2 (two) times daily. 120 tablet 3  . primidone (  MYSOLINE) 50 MG tablet Take 50 mg by mouth at bedtime.  3  . senna (SENOKOT) 8.6 MG TABS tablet Take 2 tablets (17.2 mg total) by mouth daily as needed for moderate constipation. 30 each 0   No current facility-administered medications on file prior to visit.      Review of Systems  Constitution: Negative for decreased appetite, malaise/fatigue, weight gain and weight loss.  Eyes: Negative for visual disturbance.  Cardiovascular: Negative for chest pain, claudication, dyspnea on exertion, leg swelling, orthopnea, palpitations and syncope.  Respiratory: Negative for hemoptysis and wheezing.   Endocrine: Negative for cold intolerance and heat intolerance.  Hematologic/Lymphatic: Does not bruise/bleed easily.  Skin: Negative for nail changes.  Musculoskeletal: Negative for muscle weakness and myalgias.  Gastrointestinal: Negative for abdominal pain, change in bowel habit, nausea and vomiting.  Neurological: Negative for difficulty with concentration,  dizziness, focal weakness and headaches.  Psychiatric/Behavioral: Negative for altered mental status and suicidal ideas.  All other systems reviewed and are negative.      Objective:     There were no vitals taken for this visit.  Physical Exam  Constitutional: She appears well-developed and well-nourished. No distress.  HENT:  Head: Atraumatic.  Eyes: Conjunctivae are normal.  Neck: Neck supple. No JVD present. No thyromegaly present.  Cardiovascular: Normal rate, regular rhythm, normal heart sounds and intact distal pulses. Exam reveals no gallop.  No murmur heard. Pulmonary/Chest: Effort normal and breath sounds normal.  Abdominal: Soft. Bowel sounds are normal.  Musculoskeletal: Normal range of motion.        General: No edema.  Neurological: She is alert.  Skin: Skin is warm and dry.  Psychiatric: She has a normal mood and affect.     Cardiac studies:   Outside Stress test 07/13/14:  Summary: Resting ECG: T wave inversion. Chest Pain: non-limiting. Arrhythmias: ventricular premature beats. Conclusions:  Pre-lexiscan: BP149/94 BP 71 Asymptomatic. NSR with TWi in inferior leads, and V4-V6 During lexiscan: BP 125/83. HR 105. Chest pressure started 9/10, gradually decreasing down to 4/10. NSR TWI in inferior lead and V4-V6, minimal ST depression in inferior lead, however <54m Post-stress: BP 141/90. HR 101. Chest pressure continue to improve to 2/10. NSR with TWI in inferior lead and V4-V6  Recent Labs:***  CMP Latest Ref Rng & Units 12/02/2018  Glucose 70 - 99 mg/dL 138(H)  BUN 8 - 23 mg/dL 21  Creatinine 0.44 - 1.00 mg/dL 1.23(H)  Sodium 135 - 145 mmol/L 139  Potassium 3.5 - 5.1 mmol/L 3.7  Chloride 98 - 111 mmol/L 98  CO2 22 - 32 mmol/L 30  Calcium 8.9 - 10.3 mg/dL 9.9  Total Protein 6.5 - 8.1 g/dL 9.1(H)  Total Bilirubin 0.3 - 1.2 mg/dL 0.7  Alkaline Phos 38 - 126 U/L 81  AST 15 - 41 U/L 21  ALT 0 - 44 U/L 13   CBC Latest Ref Rng & Units 12/30/2018  WBC 4.0  - 10.5 K/uL 8.8  Hemoglobin 12.0 - 15.0 g/dL 10.7(L)  Hematocrit 36.0 - 46.0 % 34.3(L)  Platelets 150 - 400 K/uL 587(H)   PCP Labs 11/26/2018: Glucose 115 BUN/Cr 25/1.04, eGFR 58, Na/K 141/5.0, AST 21,  ALT 13, Alk Phos 79. Rest of CMP normal.  Chol 205. TG 213. HDL 54.  LDL 108. HbA1c 6.2, TSH 2.75, T484fe 1.61.     Assessment & Recommendations:   There are no diagnoses linked to this encounter.   Thank you for referring this patient. Please do not hesitate to contact  me for any questions.   Jeri Lager, MSN, APRN, FNP-C [facility], [place] Office: [phone] Fax: [phone]

## 2019-01-20 ENCOUNTER — Ambulatory Visit: Payer: Self-pay | Admitting: Cardiology

## 2019-02-03 ENCOUNTER — Telehealth: Payer: Self-pay

## 2019-02-03 NOTE — Telephone Encounter (Signed)
Patient's daughter called - Back order for Anagrelide.  Pt only has 3 days left of medication.  Daughter requesting if they can get a month supply of hydroxyurea until pharmacy has Anagrelide available.    Nurse will consult with MD.

## 2019-02-04 ENCOUNTER — Telehealth: Payer: Self-pay

## 2019-02-04 MED ORDER — HYDROXYUREA 500 MG PO CAPS: 500.0000 mg | ORAL_CAPSULE | Freq: Every day | ORAL | 0 refills | Status: DC

## 2019-02-04 NOTE — Telephone Encounter (Signed)
Ok per Dr. Lindi Adie for Hydroxyurea 500mg  daily while Anagrelide is on back order.   Nurse notified daughter, voiced appreciation and has no further needs at this time.  Rx sent to pharmacy.

## 2019-03-03 ENCOUNTER — Ambulatory Visit: Payer: Self-pay | Admitting: Cardiology

## 2019-03-04 ENCOUNTER — Ambulatory Visit (INDEPENDENT_AMBULATORY_CARE_PROVIDER_SITE_OTHER): Payer: Medicare Other | Admitting: Cardiology

## 2019-03-04 ENCOUNTER — Other Ambulatory Visit: Payer: Self-pay

## 2019-03-04 ENCOUNTER — Encounter: Payer: Self-pay | Admitting: Cardiology

## 2019-03-04 VITALS — BP 145/89 | HR 71 | Temp 97.9°F | Ht 61.0 in | Wt 152.0 lb

## 2019-03-04 DIAGNOSIS — K219 Gastro-esophageal reflux disease without esophagitis: Secondary | ICD-10-CM | POA: Diagnosis not present

## 2019-03-04 DIAGNOSIS — E785 Hyperlipidemia, unspecified: Secondary | ICD-10-CM

## 2019-03-04 DIAGNOSIS — I77810 Thoracic aortic ectasia: Secondary | ICD-10-CM

## 2019-03-04 DIAGNOSIS — R079 Chest pain, unspecified: Secondary | ICD-10-CM | POA: Diagnosis not present

## 2019-03-04 DIAGNOSIS — R0602 Shortness of breath: Secondary | ICD-10-CM | POA: Diagnosis not present

## 2019-03-04 MED ORDER — ISOSORBIDE MONONITRATE ER 30 MG PO TB24: 30.0000 mg | ORAL_TABLET | Freq: Every day | ORAL | 3 refills | Status: DC

## 2019-03-04 MED ORDER — METOPROLOL SUCCINATE ER 25 MG PO TB24: 25.0000 mg | ORAL_TABLET | Freq: Every day | ORAL | 2 refills | Status: DC

## 2019-03-04 MED ORDER — OMEPRAZOLE MAGNESIUM 20 MG PO TBEC: 20.0000 mg | DELAYED_RELEASE_TABLET | Freq: Every day | ORAL | 2 refills | Status: DC

## 2019-03-04 MED ORDER — ROSUVASTATIN CALCIUM 10 MG PO TABS: 10.0000 mg | ORAL_TABLET | Freq: Every day | ORAL | 2 refills | Status: DC

## 2019-03-04 NOTE — Progress Notes (Signed)
Virtual Visit via Video Note   Subjective:   [name], female    DOB: 1936-07-10, 83 y.o.   MRN: [id number]   I connected with the patient on 03/04/19 by a video enabled telemedicine application and verified that I am speaking with the correct person using two identifiers.     I discussed the limitations of evaluation and management by telemedicine and the availability of in person appointments. The patient expressed understanding and agreed to proceed.   This visit type was conducted due to national recommendations for restrictions regarding the COVID-19 Pandemic (e.g. social distancing).  This format is felt to be most appropriate for this patient at this time.  All issues noted in this document were discussed and addressed.  No physical exam was performed (except for noted visual exam findings with Tele health visits).  The patient has consented to conduct a Tele health visit and understands insurance will be billed.     Chief complaint:  Leg edema Dyspnea on exertion   HPI  83 y.o. African American female with hypertension, type 2 DM, hyperlipidemia, essential thrombocytosis, referred for management of suspected congestive heart failure.  Patient lives with her daughter, one of her 77 alive children. Patient is active and travels to her other children in the [place], as well as in [place]. She has noticed exertional dyspnea with usual activity, such as walking in the house. She has ankle selling, although it has improved after starting lasix. She also notices retrosternal burning sensation. While it is usually after eating meals, she has also noticed it while doing physical activity, such as gardening. Blood pressure remains uncontrolled.   Patient was recently seen by her PCP Dr. Jacquelin Hawking. She was started on furosemide 20 mg daily, and losartan 50 mg once daily. Amlodipine  mg was continued. HCTZ 25 mg was stopped.   Patient is on treatment for essential thrombocytosis. She  sees Dr. Lindi Adie.   Past Medical History:  Diagnosis Date  . CHF (congestive heart failure) ([place])   . Hypertension   . Pre-diabetes   . Thrombocytosis ([place])   . Thyroid disease      Past Surgical History:  Procedure Laterality Date  . CHOLECYSTECTOMY       Social History   Socioeconomic History  . Marital status: Widowed    Spouse name: Not on file  . Number of children: Not on file  . Years of education: Not on file  . Highest education level: Not on file  Occupational History  . Not on file  Social Needs  . Financial resource strain: Not on file  . Food insecurity:    Worry: Not on file    Inability: Not on file  . Transportation needs:    Medical: Not on file    Non-medical: Not on file  Tobacco Use  . Smoking status: Never Smoker  . Smokeless tobacco: Never Used  Substance and Sexual Activity  . Alcohol use: No  . Drug use: Not on file  . Sexual activity: Never  Lifestyle  . Physical activity:    Days per week: Not on file    Minutes per session: Not on file  . Stress: Not on file  Relationships  . Social connections:    Talks on phone: Not on file    Gets together: Not on file    Attends religious service: Not on file    Active member of club or organization: Not on file    Attends meetings  of clubs or organizations: Not on file    Relationship status: Not on file  . Intimate partner violence:    Fear of current or ex partner: Not on file    Emotionally abused: Not on file    Physically abused: Not on file    Forced sexual activity: Not on file  Other Topics Concern  . Not on file  Social History Narrative  . Not on file     History reviewed. No pertinent family history.   Current Outpatient Medications on File Prior to Visit  Medication Sig Dispense Refill  . acetaminophen (TYLENOL) 325 MG tablet Take 650 mg by mouth every 6 (six) hours as needed for moderate pain or headache.    Marland Kitchen amLODipine (NORVASC) 10 MG tablet TK 1 T PO QD  1  .  anagrelide (AGRYLIN) 0.5 MG capsule Take 1 capsule (0.5 mg total) by mouth 2 (two) times daily. 360 capsule 1  . aspirin EC 81 MG tablet Take 1 tablet (81 mg total) by mouth daily. 30 tablet 0  . folic acid (FOLVITE) 1 MG tablet TK 1 T PO QD  5  . hydrochlorothiazide (HYDRODIURIL) 25 MG tablet Take 25 mg by mouth every morning.    . hydroxyurea (HYDREA) 500 MG capsule Take 1 capsule (500 mg total) by mouth daily. May take with food to minimize GI side effects. 30 capsule 0  . levothyroxine (SYNTHROID) 100 MCG tablet Take 1 tablet (100 mcg total) by mouth daily before breakfast. 30 tablet 3  . polyethylene glycol (MIRALAX / GLYCOLAX) packet Take 17 g by mouth daily. 30 each 0  . potassium chloride (KLOR-CON) 20 MEQ packet Take 40 mEq by mouth 2 (two) times daily. 120 tablet 3  . primidone (MYSOLINE) 50 MG tablet Take 50 mg by mouth at bedtime.  3  . senna (SENOKOT) 8.6 MG TABS tablet Take 2 tablets (17.2 mg total) by mouth daily as needed for moderate constipation. 30 each 0   No current facility-administered medications on file prior to visit.     Cardiovascular studies:  EKG 04/18/2018: Sinus rhythm 73 bpm. IVCD.  Echocardiogram 07/12/2014: - Left ventricle: The cavity size was normal. Wall thickness was increased in a pattern of mild LVH. The estimated ejection fraction was 60%. Wall motion was normal; there were no regional wall motion abnormalities. Doppler parameters are consistent with abnormal left ventricular relaxation (grade 1 diastolic dysfunction). - Aortic valve: Sclerosis without stenosis. There was no significant regurgitation. Valve area (Vmax): 1.92 cm^2. - Aorta: Mild dilitation of the ascending aorta. 4.1 cm - Mitral valve: There was mild regurgitation. - Left atrium: The atrium was mildly to moderately dilated. - Right ventricle: The cavity size was normal. Systolic function was normal. - Right atrium: The atrium was mildly dilated.   Recent labs:  01/09/2019: HbA1C 6.2% TSH, free T4 normal  11/26/2018: Glucose 108. BUN/Cr 2/1.04. eGFR 8.  Na/K 141/5.0. Rest of the CMP normal Chol 205, TG 213, HDL 54, LDL 108  Review of Systems  Constitution: Negative for decreased appetite, malaise/fatigue, weight gain and weight loss.  HENT: Negative for congestion.   Eyes: Negative for visual disturbance.  Cardiovascular: Positive for dyspnea on exertion and leg swelling. Negative for chest pain, palpitations and syncope.  Respiratory: Positive for shortness of breath.   Endocrine: Negative for cold intolerance.  Hematologic/Lymphatic: Does not bruise/bleed easily.  Skin: Negative for itching and rash.  Musculoskeletal: Negative for myalgias.  Gastrointestinal: Negative for abdominal pain, nausea and vomiting.  Genitourinary: Negative for dysuria.  Neurological: Negative for dizziness and weakness.  Psychiatric/Behavioral: The patient is not nervous/anxious.   All other systems reviewed and are negative.        Vitals:   03/04/19 1356  BP: (!) 145/89  Pulse: 71  Temp: 97.9 F (36.6 C)   (Measured by the patient using a home BP monitor)   Observation/findings during video visit   Objective:    Physical Exam  Constitutional: She is oriented to person, place, and time. She appears well-developed and well-nourished. No distress.  Pulmonary/Chest: Effort normal.  Neurological: She is alert and oriented to person, place, and time.  Psychiatric: She has a normal mood and affect.  Nursing note and vitals reviewed.         Assessment & Recommendations:   83 y.o. African American female with hypertension, type 2 DM, hyperlipidemia, essential thrombocytosis, referred for management of suspected congestive heart failure.  Shortness of breath: Suspect HFpEF related to uncontrolled hypertension. She will need echocardiogram at some point. Currently, she would like to avoid this during [place] pandemic. Continue current medical  therapy, with additions below  Retrosternal burning: Sometimes exertional. She would like to avoid stress test for now. I will; start medical therapy with crestor 0 mg and metoprolol succinate 25 mg daily. I will also prescribe omeprazole given possibility of GERD.  Hypertension: Uncontrolled. 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 Will need echocardiogram at some point. Recommend ARB and BB, as above.  Follow up VV in 3 weeks.   Nigel Mormon, MD [facility]. [place] Pager: [phone] Office: [phone] If no answer Cell [phone]

## 2019-03-08 ENCOUNTER — Other Ambulatory Visit: Payer: Self-pay | Admitting: Hematology and Oncology

## 2019-03-24 ENCOUNTER — Ambulatory Visit (INDEPENDENT_AMBULATORY_CARE_PROVIDER_SITE_OTHER): Payer: Medicare Other | Admitting: Cardiology

## 2019-03-24 ENCOUNTER — Encounter: Payer: Self-pay | Admitting: Cardiology

## 2019-03-24 ENCOUNTER — Other Ambulatory Visit: Payer: Self-pay

## 2019-03-24 VITALS — BP 145/85 | HR 60 | Ht 61.0 in | Wt 150.0 lb

## 2019-03-24 DIAGNOSIS — R079 Chest pain, unspecified: Secondary | ICD-10-CM | POA: Diagnosis not present

## 2019-03-24 DIAGNOSIS — R0609 Other forms of dyspnea: Secondary | ICD-10-CM | POA: Insufficient documentation

## 2019-03-24 DIAGNOSIS — R0600 Dyspnea, unspecified: Secondary | ICD-10-CM

## 2019-03-24 DIAGNOSIS — I77810 Thoracic aortic ectasia: Secondary | ICD-10-CM

## 2019-03-24 MED ORDER — METOPROLOL SUCCINATE ER 25 MG PO TB24: 25.0000 mg | ORAL_TABLET | Freq: Every day | ORAL | 2 refills | Status: DC

## 2019-03-24 MED ORDER — ISOSORBIDE MONONITRATE ER 30 MG PO TB24: 15.0000 mg | ORAL_TABLET | Freq: Every day | ORAL | 2 refills | Status: DC

## 2019-03-24 MED ORDER — FUROSEMIDE 20 MG PO TABS: 20.0000 mg | ORAL_TABLET | Freq: Every day | ORAL | 2 refills | Status: DC

## 2019-03-24 MED FILL — ANAGRELIDE HCL 0.5 MG CAP: 0.5 | 30 days supply | Qty: 60 | Fill #1

## 2019-03-24 NOTE — Progress Notes (Signed)
Virtual Visit via Video Note   Subjective:   [name], female    DOB: [birth year], 83 y.o.   MRN: [id number]   I connected with the patient on 03/24/19 by a video enabled telemedicine application and verified that I am speaking with the correct person using two identifiers.     I discussed the limitations of evaluation and management by telemedicine and the availability of in person appointments. The patient expressed understanding and agreed to proceed.   This visit type was conducted due to national recommendations for restrictions regarding the COVID-19 Pandemic (e.g. social distancing).  This format is felt to be most appropriate for this patient at this time.  All issues noted in this document were discussed and addressed.  No physical exam was performed (except for noted visual exam findings with Tele health visits).  The patient has consented to conduct a Tele health visit and understands insurance will be billed.     Chief complaint:  Leg edema Dyspnea on exertion   HPI  83 y.o. African American female with hypertension, type 2 DM, hyperlipidemia, essential thrombocytosis, referred for management of suspected congestive heart failure.  Suspecting angina symptoms, I started her on metoprolol and Imdur at last visit. Her chest pain and shortness of breath symptoms have improved, but she now has headache.   Past Medical History:  Diagnosis Date  . CHF (congestive heart failure) ([place])   . Hypertension   . Pre-diabetes   . Thrombocytosis ([place])   . Thyroid disease      Past Surgical History:  Procedure Laterality Date  . CHOLECYSTECTOMY       Social History   Socioeconomic History  . Marital status: Widowed    Spouse name: Not on file  . Number of children: Not on file  . Years of education: Not on file  . Highest education level: Not on file  Occupational History  . Not on file  Social Needs  . Financial resource strain: Not on file  . Food insecurity:   Worry: Not on file    Inability: Not on file  . Transportation needs:    Medical: Not on file    Non-medical: Not on file  Tobacco Use  . Smoking status: Never Smoker  . Smokeless tobacco: Never Used  Substance and Sexual Activity  . Alcohol use: No  . Drug use: Not on file  . Sexual activity: Never  Lifestyle  . Physical activity:    Days per week: Not on file    Minutes per session: Not on file  . Stress: Not on file  Relationships  . Social connections:    Talks on phone: Not on file    Gets together: Not on file    Attends religious service: Not on file    Active member of club or organization: Not on file    Attends meetings of clubs or organizations: Not on file    Relationship status: Not on file  . Intimate partner violence:    Fear of current or ex partner: Not on file    Emotionally abused: Not on file    Physically abused: Not on file    Forced sexual activity: Not on file  Other Topics Concern  . Not on file  Social History Narrative  . Not on file     No family history on file.   Current Outpatient Medications on File Prior to Visit  Medication Sig Dispense Refill  . acetaminophen (TYLENOL) 325 MG tablet  Take 650 mg by mouth every 6 (six) hours as needed for moderate pain or headache.    Marland Kitchen amLODipine (NORVASC) 5 MG tablet 5 mg daily.   1  . anagrelide (AGRYLIN) 0.5 MG capsule Take 1 capsule (0.5 mg total) by mouth 2 (two) times daily. (Patient not taking: Reported on 03/04/2019) 360 capsule 1  . aspirin EC 81 MG tablet Take 1 tablet (81 mg total) by mouth daily. 30 tablet 0  . folic acid (FOLVITE) 1 MG tablet TK 1 T PO QD  5  . furosemide (LASIX) 20 MG tablet Take 1 tablet by mouth daily.    . hydrochlorothiazide (HYDRODIURIL) 25 MG tablet Take 25 mg by mouth every morning.    . hydroxyurea (HYDREA) 500 MG capsule TAKE 1 CAPSULE BY MOUTH DAILY. MAY TAKE WITH FOOD TO MINIMIZE GI SIDE EFFECTS 30 capsule 0  . isosorbide mononitrate (IMDUR) 30 MG 24 hr tablet  Take 1 tablet (30 mg total) by mouth daily. 30 tablet 3  . levothyroxine (SYNTHROID) 100 MCG tablet Take 1 tablet (100 mcg total) by mouth daily before breakfast. 30 tablet 3  . losartan (COZAAR) 50 MG tablet Take 1 tablet by mouth daily.    . metoprolol succinate (TOPROL XL) 25 MG 24 hr tablet Take 1 tablet (25 mg total) by mouth daily. 30 tablet 2  . omeprazole (PRILOSEC OTC) 20 MG tablet Take 1 tablet (20 mg total) by mouth daily. 30 tablet 2  . polyethylene glycol (MIRALAX / GLYCOLAX) packet Take 17 g by mouth daily. 30 each 0  . potassium chloride (KLOR-CON) 20 MEQ packet Take 40 mEq by mouth 2 (two) times daily. (Patient not taking: Reported on 03/04/2019) 120 tablet 3  . primidone (MYSOLINE) 50 MG tablet Take 50 mg by mouth at bedtime.  3  . rosuvastatin (CRESTOR) 10 MG tablet Take 1 tablet (10 mg total) by mouth daily. 30 tablet 2  . senna (SENOKOT) 8.6 MG TABS tablet Take 2 tablets (17.2 mg total) by mouth daily as needed for moderate constipation. 30 each 0  . vitamin B-12 (CYANOCOBALAMIN) 500 MCG tablet Take 500 mcg by mouth daily.     No current facility-administered medications on file prior to visit.     Cardiovascular studies:  EKG 04/18/2018: Sinus rhythm 73 bpm. IVCD.  Echocardiogram 07/12/2014: - Left ventricle: The cavity size was normal. Wall thickness was increased in a pattern of mild LVH. The estimated ejection fraction was 60%. Wall motion was normal; there were no regional wall motion abnormalities. Doppler parameters are consistent with abnormal left ventricular relaxation (grade 1 diastolic dysfunction). - Aortic valve: Sclerosis without stenosis. There was no significant regurgitation. Valve area (Vmax): 1.92 cm^2. - Aorta: Mild dilitation of the ascending aorta. 4.1 cm - Mitral valve: There was mild regurgitation. - Left atrium: The atrium was mildly to moderately dilated. - Right ventricle: The cavity size was normal. Systolic function was  normal. - Right atrium: The atrium was mildly dilated.   Recent labs: 01/09/2019: HbA1C 6.2% TSH, free T4 normal  11/26/2018: Glucose 108. BUN/Cr 2/1.04. eGFR 8.  Na/K 141/5.0. Rest of the CMP normal Chol 205, TG 213, HDL 54, LDL 108  Review of Systems  Constitution: Negative for decreased appetite, malaise/fatigue, weight gain and weight loss.  HENT: Negative for congestion.   Eyes: Negative for visual disturbance.  Cardiovascular: Positive for dyspnea on exertion and leg swelling. Negative for chest pain, palpitations and syncope.  Respiratory: Positive for shortness of breath.   Endocrine:  Negative for cold intolerance.  Hematologic/Lymphatic: Does not bruise/bleed easily.  Skin: Negative for itching and rash.  Musculoskeletal: Negative for myalgias.  Gastrointestinal: Negative for abdominal pain, nausea and vomiting.  Genitourinary: Negative for dysuria.  Neurological: Negative for dizziness and weakness.  Psychiatric/Behavioral: The patient is not nervous/anxious.   All other systems reviewed and are negative.        Vitals:   03/24/19 1419  BP: (!) 145/85  Pulse: 60   (Measured by the patient using a home BP monitor)   Observation/findings during video visit   Objective:    Physical Exam  Constitutional: She is oriented to person, place, and time. She appears well-developed and well-nourished. No distress.  Pulmonary/Chest: Effort normal.  Neurological: She is alert and oriented to person, place, and time.  Psychiatric: She has a normal mood and affect.  Nursing note and vitals reviewed.         Assessment & Recommendations:   83 y.o. African American female with hypertension, type 2 DM, hyperlipidemia, essential thrombocytosis, referred for management of suspected congestive heart failure.  Shortness of breath, chest pain: Improved on medical therapy. Continue Aspirin/statin.  Continue metoprolol succinate 25 mg daily. Reduce Imdur to 1/2 tab of  30 mg daily, due to headache. Cointue lasix 20 mg daily She would like to avoid testing at this time in light of the COVID pandemic. Given that her symptoms have improved, I think this is reasonable. I will see her back in 8 wks for a virtual visit.   Hypertension: Continue current antihypertensive therapy.  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 Will need echocardiogram at some point. Recommend ARB and BB, as above.  Follow up VV in 8 weeks.   Nigel Mormon, MD [facility] [place] Pager: [phone] Office: [phone] If no answer Cell [phone]

## 2019-04-11 DIAGNOSIS — R609 Edema, unspecified: Secondary | ICD-10-CM | POA: Diagnosis not present

## 2019-04-11 DIAGNOSIS — I10 Essential (primary) hypertension: Secondary | ICD-10-CM | POA: Diagnosis not present

## 2019-04-11 DIAGNOSIS — E669 Obesity, unspecified: Secondary | ICD-10-CM | POA: Diagnosis not present

## 2019-04-11 DIAGNOSIS — E1165 Type 2 diabetes mellitus with hyperglycemia: Secondary | ICD-10-CM | POA: Diagnosis not present

## 2019-04-11 DIAGNOSIS — E039 Hypothyroidism, unspecified: Secondary | ICD-10-CM | POA: Diagnosis not present

## 2019-04-11 DIAGNOSIS — E7211 Homocystinuria: Secondary | ICD-10-CM | POA: Diagnosis not present

## 2019-04-11 DIAGNOSIS — E782 Mixed hyperlipidemia: Secondary | ICD-10-CM | POA: Diagnosis not present

## 2019-04-16 MED FILL — ANAGRELIDE HCL 0.5 MG CAP: 0.5 | 30 days supply | Qty: 60 | Fill #2

## 2019-04-22 DIAGNOSIS — I10 Essential (primary) hypertension: Secondary | ICD-10-CM | POA: Diagnosis not present

## 2019-04-22 DIAGNOSIS — E7211 Homocystinuria: Secondary | ICD-10-CM | POA: Diagnosis not present

## 2019-04-22 DIAGNOSIS — E669 Obesity, unspecified: Secondary | ICD-10-CM | POA: Diagnosis not present

## 2019-04-22 DIAGNOSIS — Z0001 Encounter for general adult medical examination with abnormal findings: Secondary | ICD-10-CM | POA: Diagnosis not present

## 2019-04-22 DIAGNOSIS — E782 Mixed hyperlipidemia: Secondary | ICD-10-CM | POA: Diagnosis not present

## 2019-04-22 DIAGNOSIS — E1340 Other specified diabetes mellitus with diabetic neuropathy, unspecified: Secondary | ICD-10-CM | POA: Diagnosis not present

## 2019-04-22 DIAGNOSIS — Z1389 Encounter for screening for other disorder: Secondary | ICD-10-CM | POA: Diagnosis not present

## 2019-04-22 DIAGNOSIS — E039 Hypothyroidism, unspecified: Secondary | ICD-10-CM | POA: Diagnosis not present

## 2019-04-22 DIAGNOSIS — R609 Edema, unspecified: Secondary | ICD-10-CM | POA: Diagnosis not present

## 2019-04-22 DIAGNOSIS — E1165 Type 2 diabetes mellitus with hyperglycemia: Secondary | ICD-10-CM | POA: Diagnosis not present

## 2019-05-16 ENCOUNTER — Encounter (INDEPENDENT_AMBULATORY_CARE_PROVIDER_SITE_OTHER): Payer: Medicare Other | Admitting: Ophthalmology

## 2019-05-20 DIAGNOSIS — E039 Hypothyroidism, unspecified: Secondary | ICD-10-CM | POA: Diagnosis not present

## 2019-05-20 DIAGNOSIS — D509 Iron deficiency anemia, unspecified: Secondary | ICD-10-CM | POA: Diagnosis not present

## 2019-05-20 DIAGNOSIS — E1340 Other specified diabetes mellitus with diabetic neuropathy, unspecified: Secondary | ICD-10-CM | POA: Diagnosis not present

## 2019-05-20 DIAGNOSIS — E1165 Type 2 diabetes mellitus with hyperglycemia: Secondary | ICD-10-CM | POA: Diagnosis not present

## 2019-05-20 DIAGNOSIS — R609 Edema, unspecified: Secondary | ICD-10-CM | POA: Diagnosis not present

## 2019-05-20 DIAGNOSIS — E782 Mixed hyperlipidemia: Secondary | ICD-10-CM | POA: Diagnosis not present

## 2019-05-20 DIAGNOSIS — E669 Obesity, unspecified: Secondary | ICD-10-CM | POA: Diagnosis not present

## 2019-05-20 DIAGNOSIS — I10 Essential (primary) hypertension: Secondary | ICD-10-CM | POA: Diagnosis not present

## 2019-05-20 DIAGNOSIS — E7211 Homocystinuria: Secondary | ICD-10-CM | POA: Diagnosis not present

## 2019-05-21 ENCOUNTER — Other Ambulatory Visit: Payer: Self-pay

## 2019-05-21 ENCOUNTER — Ambulatory Visit (INDEPENDENT_AMBULATORY_CARE_PROVIDER_SITE_OTHER): Payer: Medicare Other | Admitting: Cardiology

## 2019-05-21 VITALS — BP 129/79 | HR 63 | Wt 153.0 lb

## 2019-05-21 DIAGNOSIS — I10 Essential (primary) hypertension: Secondary | ICD-10-CM

## 2019-05-21 DIAGNOSIS — I77810 Thoracic aortic ectasia: Secondary | ICD-10-CM | POA: Diagnosis not present

## 2019-05-21 DIAGNOSIS — R0609 Other forms of dyspnea: Secondary | ICD-10-CM | POA: Diagnosis not present

## 2019-05-21 DIAGNOSIS — R0600 Dyspnea, unspecified: Secondary | ICD-10-CM

## 2019-05-21 NOTE — Progress Notes (Signed)
Virtual Visit via Video Note   Subjective:   [name], female    DOB: [birth year], 83 y.o.   MRN: [id number]   I connected with the patient on 05/21/19 by a video enabled telemedicine application and verified that I am speaking with the correct person using two identifiers.     I discussed the limitations of evaluation and management by telemedicine and the availability of in person appointments. The patient expressed understanding and agreed to proceed.   This visit type was conducted due to national recommendations for restrictions regarding the COVID-19 Pandemic (e.g. social distancing).  This format is felt to be most appropriate for this patient at this time.  All issues noted in this document were discussed and addressed.  No physical exam was performed (except for noted visual exam findings with Tele health visits).  The patient has consented to conduct a Tele health visit and understands insurance will be billed.     Chief complaint:  Leg edema Dyspnea on exertion   HPI  83 y.o. African American female with hypertension, type 2 DM, hyperlipidemia, essential thrombocytosis, referred for management of suspected congestive heart failure.  Patient joined me today on video visit with her daughter. She recently had worsening leg edema, that has improved with increasing dose of diuretic. She has not had any recurrent chest pain. She has stable dyspnea on exertion, that has not worsened recently.   Past Medical History:  Diagnosis Date  . CHF (congestive heart failure) ([place])   . Hypertension   . Pre-diabetes   . Thrombocytosis ([place])   . Thyroid disease      Past Surgical History:  Procedure Laterality Date  . CHOLECYSTECTOMY       Social History   Socioeconomic History  . Marital status: Widowed    Spouse name: Not on file  . Number of children: 23  . Years of education: Not on file  . Highest education level: Not on file  Occupational History  . Not on file   Social Needs  . Financial resource strain: Not on file  . Food insecurity    Worry: Not on file    Inability: Not on file  . Transportation needs    Medical: Not on file    Non-medical: Not on file  Tobacco Use  . Smoking status: Never Smoker  . Smokeless tobacco: Never Used  Substance and Sexual Activity  . Alcohol use: No  . Drug use: Not on file  . Sexual activity: Never  Lifestyle  . Physical activity    Days per week: Not on file    Minutes per session: Not on file  . Stress: Not on file  Relationships  . Social Herbalist on phone: Not on file    Gets together: Not on file    Attends religious service: Not on file    Active member of club or organization: Not on file    Attends meetings of clubs or organizations: Not on file    Relationship status: Not on file  . Intimate partner violence    Fear of current or ex partner: Not on file    Emotionally abused: Not on file    Physically abused: Not on file    Forced sexual activity: Not on file  Other Topics Concern  . Not on file  Social History Narrative  . Not on file     No family history on file.   Current Outpatient Medications on File  Prior to Visit  Medication Sig Dispense Refill  . acetaminophen (TYLENOL) 325 MG tablet Take 650 mg by mouth every 6 (six) hours as needed for moderate pain or headache.    Marland Kitchen amLODipine (NORVASC) 5 MG tablet 5 mg daily.   1  . aspirin EC 81 MG tablet Take 1 tablet (81 mg total) by mouth daily. 30 tablet 0  . folic acid (FOLVITE) 1 MG tablet TK 1 T PO QD  5  . furosemide (LASIX) 20 MG tablet Take 1 tablet (20 mg total) by mouth daily. 60 tablet 2  . hydrochlorothiazide (HYDRODIURIL) 25 MG tablet Take 25 mg by mouth every morning.    . hydroxyurea (HYDREA) 500 MG capsule TAKE 1 CAPSULE BY MOUTH DAILY. MAY TAKE WITH FOOD TO MINIMIZE GI SIDE EFFECTS 30 capsule 0  . isosorbide mononitrate (IMDUR) 30 MG 24 hr tablet Take 0.5 tablets (15 mg total) by mouth daily. 30 tablet  2  . levothyroxine (SYNTHROID) 100 MCG tablet Take 1 tablet (100 mcg total) by mouth daily before breakfast. 30 tablet 3  . losartan (COZAAR) 50 MG tablet Take 1 tablet by mouth daily.    . metoprolol succinate (TOPROL XL) 25 MG 24 hr tablet Take 1 tablet (25 mg total) by mouth daily. 60 tablet 2  . omeprazole (PRILOSEC OTC) 20 MG tablet Take 1 tablet (20 mg total) by mouth daily. 30 tablet 2  . polyethylene glycol (MIRALAX / GLYCOLAX) packet Take 17 g by mouth daily. 30 each 0  . potassium chloride (KLOR-CON) 20 MEQ packet Take 40 mEq by mouth 2 (two) times daily. (Patient not taking: Reported on 03/04/2019) 120 tablet 3  . primidone (MYSOLINE) 50 MG tablet Take 50 mg by mouth at bedtime.  3  . rosuvastatin (CRESTOR) 10 MG tablet Take 1 tablet (10 mg total) by mouth daily. 30 tablet 2  . senna (SENOKOT) 8.6 MG TABS tablet Take 2 tablets (17.2 mg total) by mouth daily as needed for moderate constipation. 30 each 0  . vitamin B-12 (CYANOCOBALAMIN) 500 MCG tablet Take 1,000 mcg by mouth daily.      No current facility-administered medications on file prior to visit.     Cardiovascular studies:  EKG 04/18/2018: Sinus rhythm 73 bpm. IVCD.  Echocardiogram 07/12/2014: - Left ventricle: The cavity size was normal. Wall thickness was increased in a pattern of mild LVH. The estimated ejection fraction was 60%. Wall motion was normal; there were no regional wall motion abnormalities. Doppler parameters are consistent with abnormal left ventricular relaxation (grade 1 diastolic dysfunction). - Aortic valve: Sclerosis without stenosis. There was no significant regurgitation. Valve area (Vmax): 1.92 cm^2. - Aorta: Mild dilitation of the ascending aorta. 4.1 cm - Mitral valve: There was mild regurgitation. - Left atrium: The atrium was mildly to moderately dilated. - Right ventricle: The cavity size was normal. Systolic function was normal. - Right atrium: The atrium was mildly  dilated.   Recent labs: 01/09/2019: HbA1C 6.2% TSH, free T4 normal  11/26/2018: Glucose 108. BUN/Cr 2/1.04. eGFR 8.  Na/K 141/5.0. Rest of the CMP normal Chol 205, TG 213, HDL 54, LDL 108  Review of Systems  Constitution: Negative for decreased appetite, malaise/fatigue, weight gain and weight loss.  HENT: Negative for congestion.   Eyes: Negative for visual disturbance.  Cardiovascular: Positive for dyspnea on exertion and leg swelling. Negative for chest pain, palpitations and syncope.  Respiratory: Positive for shortness of breath.   Endocrine: Negative for cold intolerance.  Hematologic/Lymphatic: Does not  bruise/bleed easily.  Skin: Negative for itching and rash.  Musculoskeletal: Negative for myalgias.  Gastrointestinal: Negative for abdominal pain, nausea and vomiting.  Genitourinary: Negative for dysuria.  Neurological: Negative for dizziness and weakness.  Psychiatric/Behavioral: The patient is not nervous/anxious.   All other systems reviewed and are negative.        There were no vitals filed for this visit. (Measured by the patient using a home BP monitor)   Observation/findings during video visit   Objective:    Physical Exam  Constitutional: She is oriented to person, place, and time. She appears well-developed and well-nourished. No distress.  Pulmonary/Chest: Effort normal.  Neurological: She is alert and oriented to person, place, and time.  Psychiatric: She has a normal mood and affect.  Nursing note and vitals reviewed.         Assessment & Recommendations:   83 y.o. African American female with hypertension, type 2 DM, hyperlipidemia, essential thrombocytosis, referred for management of suspected congestive heart failure.  Shortness of breath, chest pain: Patient would like to continue conservative medical therapy.  Continue current medical therapy.   Hypertension: Continue current antihypertensive therapy.  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 Will need echocardiogram at some point. Recommend ARB and BB.  Follow up VV in 3 months. Patient would let us know when she would be comfortable to check echocardiogram.   Nigel Mormon, MD [facility] [place] Pager: [phone] Office: [phone] If no answer Cell [phone]

## 2019-05-22 MED FILL — ANAGRELIDE HCL 0.5 MG CAP: 0.5 | 30 days supply | Qty: 60 | Fill #3

## 2019-05-23 ENCOUNTER — Encounter: Payer: Self-pay | Admitting: Cardiology

## 2019-05-25 ENCOUNTER — Other Ambulatory Visit: Payer: Self-pay | Admitting: Cardiology

## 2019-05-25 DIAGNOSIS — K219 Gastro-esophageal reflux disease without esophagitis: Secondary | ICD-10-CM

## 2019-05-25 DIAGNOSIS — E785 Hyperlipidemia, unspecified: Secondary | ICD-10-CM

## 2019-06-03 DIAGNOSIS — E1165 Type 2 diabetes mellitus with hyperglycemia: Secondary | ICD-10-CM | POA: Diagnosis not present

## 2019-06-03 DIAGNOSIS — R609 Edema, unspecified: Secondary | ICD-10-CM | POA: Diagnosis not present

## 2019-06-03 DIAGNOSIS — E782 Mixed hyperlipidemia: Secondary | ICD-10-CM | POA: Diagnosis not present

## 2019-06-03 DIAGNOSIS — E7211 Homocystinuria: Secondary | ICD-10-CM | POA: Diagnosis not present

## 2019-06-03 DIAGNOSIS — D509 Iron deficiency anemia, unspecified: Secondary | ICD-10-CM | POA: Diagnosis not present

## 2019-06-03 DIAGNOSIS — E669 Obesity, unspecified: Secondary | ICD-10-CM | POA: Diagnosis not present

## 2019-06-03 DIAGNOSIS — E1340 Other specified diabetes mellitus with diabetic neuropathy, unspecified: Secondary | ICD-10-CM | POA: Diagnosis not present

## 2019-06-03 DIAGNOSIS — M25571 Pain in right ankle and joints of right foot: Secondary | ICD-10-CM | POA: Diagnosis not present

## 2019-06-03 DIAGNOSIS — E039 Hypothyroidism, unspecified: Secondary | ICD-10-CM | POA: Diagnosis not present

## 2019-06-03 DIAGNOSIS — M25572 Pain in left ankle and joints of left foot: Secondary | ICD-10-CM | POA: Diagnosis not present

## 2019-06-03 DIAGNOSIS — I10 Essential (primary) hypertension: Secondary | ICD-10-CM | POA: Diagnosis not present

## 2019-06-16 DIAGNOSIS — Z20828 Contact with and (suspected) exposure to other viral communicable diseases: Secondary | ICD-10-CM | POA: Diagnosis not present

## 2019-06-16 MED FILL — ANAGRELIDE HCL 0.5 MG CAP: 0.5 | 30 days supply | Qty: 60 | Fill #4

## 2019-06-25 ENCOUNTER — Other Ambulatory Visit: Payer: Self-pay | Admitting: Hematology and Oncology

## 2019-06-29 DIAGNOSIS — Z20828 Contact with and (suspected) exposure to other viral communicable diseases: Secondary | ICD-10-CM | POA: Diagnosis not present

## 2019-07-16 MED FILL — ANAGRELIDE HCL 0.5 MG CAP: 0.5 | 30 days supply | Qty: 60 | Fill #5

## 2019-07-18 ENCOUNTER — Other Ambulatory Visit: Payer: Self-pay | Admitting: Cardiology

## 2019-07-18 DIAGNOSIS — K219 Gastro-esophageal reflux disease without esophagitis: Secondary | ICD-10-CM

## 2019-08-14 MED FILL — ANAGRELIDE HCL 0.5 MG CAP: 0.5 | 30 days supply | Qty: 60 | Fill #6

## 2019-08-17 ENCOUNTER — Other Ambulatory Visit: Payer: Self-pay | Admitting: Cardiology

## 2019-08-17 DIAGNOSIS — E785 Hyperlipidemia, unspecified: Secondary | ICD-10-CM

## 2019-08-21 ENCOUNTER — Telehealth: Payer: Medicare Other | Admitting: Cardiology

## 2019-08-21 NOTE — Progress Notes (Deleted)
Virtual Visit via Video Note   Subjective:   [name], female    DOB: 07/13/1936, 83 y.o.   MRN: [id number]   I connected with the patient on 08/21/19 by a video enabled telemedicine application and verified that I am speaking with the correct person using two identifiers.     I discussed the limitations of evaluation and management by telemedicine and the availability of in person appointments. The patient expressed understanding and agreed to proceed.   This visit type was conducted due to national recommendations for restrictions regarding the COVID-19 Pandemic (e.g. social distancing).  This format is felt to be most appropriate for this patient at this time.  All issues noted in this document were discussed and addressed.  No physical exam was performed (except for noted visual exam findings with Tele health visits).  The patient has consented to conduct a Tele health visit and understands insurance will be billed.     Chief complaint:  Leg edema Dyspnea on exertion   HPI  82 y.o. African American female with hypertension, type 2 DM, hyperlipidemia, essential thrombocytosis, referred for management of suspected congestive heart failure.  Patient joined me today on video visit with her daughter. She recently had worsening leg edema, that has improved with increasing dose of diuretic. She has not had any recurrent chest pain. She has stable dyspnea on exertion, that has not worsened recently.   Past Medical History:  Diagnosis Date  . CHF (congestive heart failure) ([place])   . Hypertension   . Pre-diabetes   . Thrombocytosis ([place])   . Thyroid disease      Past Surgical History:  Procedure Laterality Date  . CHOLECYSTECTOMY       Social History   Socioeconomic History  . Marital status: Widowed    Spouse name: Not on file  . Number of children: 80  . Years of education: Not on file  . Highest education level: Not on file  Occupational History  . Not on file   Social Needs  . Financial resource strain: Not on file  . Food insecurity    Worry: Not on file    Inability: Not on file  . Transportation needs    Medical: Not on file    Non-medical: Not on file  Tobacco Use  . Smoking status: Never Smoker  . Smokeless tobacco: Never Used  Substance and Sexual Activity  . Alcohol use: No  . Drug use: Not on file  . Sexual activity: Never  Lifestyle  . Physical activity    Days per week: Not on file    Minutes per session: Not on file  . Stress: Not on file  Relationships  . Social Herbalist on phone: Not on file    Gets together: Not on file    Attends religious service: Not on file    Active member of club or organization: Not on file    Attends meetings of clubs or organizations: Not on file    Relationship status: Not on file  . Intimate partner violence    Fear of current or ex partner: Not on file    Emotionally abused: Not on file    Physically abused: Not on file    Forced sexual activity: Not on file  Other Topics Concern  . Not on file  Social History Narrative  . Not on file     No family history on file.   Current Outpatient Medications on File  Prior to Visit  Medication Sig Dispense Refill  . acetaminophen (TYLENOL) 325 MG tablet Take 650 mg by mouth every 6 (six) hours as needed for moderate pain or headache.    Marland Kitchen amLODipine (NORVASC) 5 MG tablet 5 mg daily.   1  . anagrelide (AGRYLIN) 0.5 MG capsule Take 0.5 mg by mouth 2 (two) times daily.    Marland Kitchen aspirin EC 81 MG tablet Take 1 tablet (81 mg total) by mouth daily. 30 tablet 0  . folic acid (FOLVITE) 1 MG tablet TK 1 T PO QD  5  . furosemide (LASIX) 20 MG tablet Take 1 tablet (20 mg total) by mouth daily. (Patient taking differently: Take 40 mg by mouth daily. ) 60 tablet 2  . gabapentin (NEURONTIN) 300 MG capsule Take 300 mg by mouth daily.    . hydrochlorothiazide (HYDRODIURIL) 25 MG tablet Take 25 mg by mouth every morning.    . isosorbide mononitrate  (IMDUR) 30 MG 24 hr tablet Take 0.5 tablets (15 mg total) by mouth daily. 30 tablet 2  . levothyroxine (SYNTHROID) 100 MCG tablet Take 1 tablet (100 mcg total) by mouth daily before breakfast. 30 tablet 3  . losartan (COZAAR) 50 MG tablet Take 1 tablet by mouth daily.    . metFORMIN (GLUCOPHAGE) 500 MG tablet Take by mouth daily.    . metoprolol succinate (TOPROL XL) 25 MG 24 hr tablet Take 1 tablet (25 mg total) by mouth daily. 60 tablet 2  . omeprazole (PRILOSEC) 20 MG capsule TAKE ONE CAPSULE BY MOUTH EVERY DAY 90 capsule 3  . polyethylene glycol (MIRALAX / GLYCOLAX) packet Take 17 g by mouth daily. 30 each 0  . potassium chloride (KLOR-CON) 20 MEQ packet Take 40 mEq by mouth 2 (two) times daily. (Patient not taking: Reported on 03/04/2019) 120 tablet 3  . primidone (MYSOLINE) 50 MG tablet Take 50 mg by mouth at bedtime.  3  . rosuvastatin (CRESTOR) 10 MG tablet TAKE 1 TABLET(10 MG) BY MOUTH DAILY 30 tablet 2  . senna (SENOKOT) 8.6 MG TABS tablet Take 2 tablets (17.2 mg total) by mouth daily as needed for moderate constipation. 30 each 0  . vitamin B-12 (CYANOCOBALAMIN) 500 MCG tablet Take 1,000 mcg by mouth daily.      No current facility-administered medications on file prior to visit.     Cardiovascular studies:  EKG 04/18/2018: Sinus rhythm 73 bpm. IVCD.  Echocardiogram 07/12/2014: - Left ventricle: The cavity size was normal. Wall thickness was increased in a pattern of mild LVH. The estimated ejection fraction was 60%. Wall motion was normal; there were no regional wall motion abnormalities. Doppler parameters are consistent with abnormal left ventricular relaxation (grade 1 diastolic dysfunction). - Aortic valve: Sclerosis without stenosis. There was no significant regurgitation. Valve area (Vmax): 1.92 cm^2. - Aorta: Mild dilitation of the ascending aorta. 4.1 cm - Mitral valve: There was mild regurgitation. - Left atrium: The atrium was mildly to moderately  dilated. - Right ventricle: The cavity size was normal. Systolic function was normal. - Right atrium: The atrium was mildly dilated.   Recent labs: 01/09/2019: HbA1C 6.2% TSH, free T4 normal  11/26/2018: Glucose 108. BUN/Cr 2/1.04. eGFR 8.  Na/K 141/5.0. Rest of the CMP normal Chol 205, TG 213, HDL 54, LDL 108  Review of Systems  Constitution: Negative for decreased appetite, malaise/fatigue, weight gain and weight loss.  HENT: Negative for congestion.   Eyes: Negative for visual disturbance.  Cardiovascular: Positive for dyspnea on exertion and leg  swelling. Negative for chest pain, palpitations and syncope.  Respiratory: Positive for shortness of breath.   Endocrine: Negative for cold intolerance.  Hematologic/Lymphatic: Does not bruise/bleed easily.  Skin: Negative for itching and rash.  Musculoskeletal: Negative for myalgias.  Gastrointestinal: Negative for abdominal pain, nausea and vomiting.  Genitourinary: Negative for dysuria.  Neurological: Negative for dizziness and weakness.  Psychiatric/Behavioral: The patient is not nervous/anxious.   All other systems reviewed and are negative.       *** There were no vitals filed for this visit. (Measured by the patient using a home BP monitor)   Observation/findings during video visit   Objective:    Physical Exam  Constitutional: She is oriented to person, place, and time. She appears well-developed and well-nourished. No distress.  Pulmonary/Chest: Effort normal.  Neurological: She is alert and oriented to person, place, and time.  Psychiatric: She has a normal mood and affect.  Nursing note and vitals reviewed.         Assessment & Recommendations:   83 y.o. African American female with hypertension, type 2 DM, hyperlipidemia, essential thrombocytosis, referred for management of suspected congestive heart failure.  Shortness of breath, chest pain: Patient would like to continue conservative medical  therapy.  Continue current medical therapy.   Hypertension: Continue current antihypertensive therapy.  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 Will need echocardiogram at some point. Recommend ARB and BB.  Follow up VV in 3 months. Patient would let us know when she would be comfortable to check echocardiogram.   Nigel Mormon, MD [facility]. [place] Pager: [phone] Office: [phone] If no answer Cell [phone]

## 2019-08-28 ENCOUNTER — Other Ambulatory Visit: Payer: Self-pay | Admitting: Cardiology

## 2019-08-28 DIAGNOSIS — R079 Chest pain, unspecified: Secondary | ICD-10-CM

## 2019-09-20 ENCOUNTER — Other Ambulatory Visit: Payer: Self-pay | Admitting: Cardiology

## 2019-09-20 DIAGNOSIS — R079 Chest pain, unspecified: Secondary | ICD-10-CM

## 2019-10-17 MED FILL — ANAGRELIDE HCL 0.5 MG CAP: 0.5 | 30 days supply | Qty: 60 | Fill #8

## 2019-11-09 ENCOUNTER — Other Ambulatory Visit: Payer: Self-pay | Admitting: Oncology

## 2019-11-10 ENCOUNTER — Other Ambulatory Visit: Payer: Self-pay

## 2019-11-10 MED ORDER — ANAGRELIDE HCL 0.5 MG PO CAPS: 0.5000 mg | ORAL_CAPSULE | Freq: Two times a day (BID) | ORAL | 0 refills | Status: DC

## 2019-11-16 ENCOUNTER — Other Ambulatory Visit: Payer: Self-pay | Admitting: Cardiology

## 2019-11-16 DIAGNOSIS — R079 Chest pain, unspecified: Secondary | ICD-10-CM

## 2019-11-17 MED FILL — ANAGRELIDE HCL 0.5 MG CAP: 0.5 | 30 days supply | Qty: 60 | Fill #9

## 2019-12-14 MED FILL — ANAGRELIDE HCL 0.5 MG CAP: 0.5 | 30 days supply | Qty: 60 | Fill #10

## 2019-12-30 ENCOUNTER — Other Ambulatory Visit: Payer: Self-pay | Admitting: Hematology and Oncology

## 2019-12-30 MED FILL — ANAGRELIDE HCL 0.5 MG CAP: 0.5 | 30 days supply | Qty: 60 | Fill #0

## 2019-12-31 ENCOUNTER — Inpatient Hospital Stay: Payer: Medicare Other

## 2019-12-31 ENCOUNTER — Inpatient Hospital Stay: Payer: Medicare Other | Admitting: Hematology and Oncology

## 2019-12-31 NOTE — Assessment & Plan Note (Deleted)
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  Lab review 05/14/2018:Platelet count 588 12/02/2018: Platelet [id number] 12/30/2018: Platelet count: 587  Patientwentto [place] for 6 months.  Return to clinic in 1 year for follow-up with labs

## 2020-01-04 ENCOUNTER — Other Ambulatory Visit: Payer: Self-pay | Admitting: Cardiology

## 2020-01-04 DIAGNOSIS — E785 Hyperlipidemia, unspecified: Secondary | ICD-10-CM

## 2020-01-30 MED FILL — ANAGRELIDE HCL 0.5 MG CAP: 0.5 | 30 days supply | Qty: 60 | Fill #1

## 2020-02-03 ENCOUNTER — Other Ambulatory Visit: Payer: Self-pay | Admitting: Cardiology

## 2020-02-03 DIAGNOSIS — R079 Chest pain, unspecified: Secondary | ICD-10-CM

## 2020-02-26 ENCOUNTER — Other Ambulatory Visit: Payer: Self-pay | Admitting: Hematology and Oncology

## 2020-06-01 ENCOUNTER — Other Ambulatory Visit: Payer: Self-pay | Admitting: Cardiology

## 2020-06-01 DIAGNOSIS — R079 Chest pain, unspecified: Secondary | ICD-10-CM

## 2020-06-02 ENCOUNTER — Other Ambulatory Visit: Payer: Self-pay | Admitting: Cardiology

## 2020-06-02 DIAGNOSIS — R079 Chest pain, unspecified: Secondary | ICD-10-CM

## 2020-06-03 ENCOUNTER — Other Ambulatory Visit: Payer: Self-pay | Admitting: Cardiology

## 2020-06-03 DIAGNOSIS — R079 Chest pain, unspecified: Secondary | ICD-10-CM

## 2020-07-01 ENCOUNTER — Telehealth: Payer: Self-pay | Admitting: Hematology and Oncology

## 2020-07-01 NOTE — Telephone Encounter (Signed)
Called pt per 8/25 sch msg - left message for patient to call back to reschedule.

## 2020-07-29 ENCOUNTER — Ambulatory Visit: Payer: Medicare Other | Admitting: Cardiology

## 2020-07-29 ENCOUNTER — Encounter: Payer: Self-pay | Admitting: Cardiology

## 2020-07-29 ENCOUNTER — Other Ambulatory Visit: Payer: Self-pay

## 2020-07-29 VITALS — BP 130/70 | HR 77 | Resp 16 | Ht 61.0 in | Wt 151.0 lb

## 2020-07-29 DIAGNOSIS — I10 Essential (primary) hypertension: Secondary | ICD-10-CM | POA: Diagnosis not present

## 2020-07-29 DIAGNOSIS — E782 Mixed hyperlipidemia: Secondary | ICD-10-CM | POA: Diagnosis not present

## 2020-07-29 DIAGNOSIS — I77810 Thoracic aortic ectasia: Secondary | ICD-10-CM | POA: Diagnosis not present

## 2020-07-29 DIAGNOSIS — R0789 Other chest pain: Secondary | ICD-10-CM | POA: Insufficient documentation

## 2020-07-29 DIAGNOSIS — E785 Hyperlipidemia, unspecified: Secondary | ICD-10-CM

## 2020-07-29 DIAGNOSIS — E039 Hypothyroidism, unspecified: Secondary | ICD-10-CM | POA: Diagnosis not present

## 2020-07-29 DIAGNOSIS — Z0001 Encounter for general adult medical examination with abnormal findings: Secondary | ICD-10-CM | POA: Diagnosis not present

## 2020-07-29 DIAGNOSIS — D509 Iron deficiency anemia, unspecified: Secondary | ICD-10-CM | POA: Diagnosis not present

## 2020-07-29 DIAGNOSIS — E1340 Other specified diabetes mellitus with diabetic neuropathy, unspecified: Secondary | ICD-10-CM | POA: Diagnosis not present

## 2020-07-29 DIAGNOSIS — E669 Obesity, unspecified: Secondary | ICD-10-CM | POA: Diagnosis not present

## 2020-07-29 DIAGNOSIS — Z1321 Encounter for screening for nutritional disorder: Secondary | ICD-10-CM | POA: Diagnosis not present

## 2020-07-29 DIAGNOSIS — R079 Chest pain, unspecified: Secondary | ICD-10-CM | POA: Insufficient documentation

## 2020-07-29 DIAGNOSIS — E1165 Type 2 diabetes mellitus with hyperglycemia: Secondary | ICD-10-CM | POA: Diagnosis not present

## 2020-07-29 DIAGNOSIS — E7211 Homocystinuria: Secondary | ICD-10-CM | POA: Diagnosis not present

## 2020-07-29 MED ORDER — NITROGLYCERIN 0.4 MG SL SUBL: 0.4000 mg | SUBLINGUAL_TABLET | SUBLINGUAL | 3 refills | Status: DC | PRN

## 2020-07-29 MED ORDER — ROSUVASTATIN CALCIUM 10 MG PO TABS: 10.0000 mg | ORAL_TABLET | Freq: Every day | ORAL | 2 refills | Status: DC

## 2020-07-29 NOTE — Progress Notes (Signed)
Subjective:   [name], female    DOB: 1936/02/24, 84 y.o.   MRN: [id number]   Chief complaint:  Leg edema Dyspnea on exertion   Hypertension Pertinent negatives include no chest pain or palpitations.    84 y.o. African American female with hypertension, type 2 DM, hyperlipidemia, essential thrombocytosis  I previously saw patient on a virtual visit in 2020, for complaints of chest pain or shortness of breath.  She is here to see me for over a year.  She has only occasional chest pain episode lasting for few seconds.  She does not have any overt exertional dyspnea, orthopnea, PND, leg edema.  Patient is hoping to go back to her home country [place] in a month and stay there for 6 to 12 months.  She currently takes amlodipine and losartan in the morning, hydrochlorothiazide at night.  She reports having nocturia with this.  Blood pressure is elevated, typically towards the end of the day.  Current Outpatient Medications on File Prior to Visit  Medication Sig Dispense Refill  . acetaminophen (TYLENOL) 325 MG tablet Take 650 mg by mouth every 6 (six) hours as needed for moderate pain or headache.    Marland Kitchen amLODipine (NORVASC) 5 MG tablet 5 mg daily.   1  . anagrelide (AGRYLIN) 0.5 MG capsule TAKE 1 CAPSULE BY MOUTH TWO TIMES DAILY 60 capsule 1  . aspirin EC 81 MG tablet Take 1 tablet (81 mg total) by mouth daily. 30 tablet 0  . folic acid (FOLVITE) 1 MG tablet TK 1 T PO QD  5  . furosemide (LASIX) 20 MG tablet Take 1 tablet (20 mg total) by mouth daily. (Patient taking differently: Take 40 mg by mouth daily. ) 60 tablet 2  . gabapentin (NEURONTIN) 300 MG capsule Take 300 mg by mouth daily.    . hydrochlorothiazide (HYDRODIURIL) 25 MG tablet Take 25 mg by mouth every morning.    . isosorbide mononitrate (IMDUR) 30 MG 24 hr tablet TAKE 1 TABLET(30 MG) BY MOUTH DAILY 30 tablet 0  . levothyroxine (SYNTHROID) 100 MCG tablet Take 1 tablet (100 mcg total) by mouth daily before breakfast. 30  tablet 3  . losartan (COZAAR) 50 MG tablet Take 1 tablet by mouth daily.    . meloxicam (MOBIC) 7.5 MG tablet Take 7.5-15 mg by mouth daily.    . metFORMIN (GLUCOPHAGE) 500 MG tablet Take by mouth daily.    . metoprolol succinate (TOPROL-XL) 25 MG 24 hr tablet TAKE 1 TABLET(25 MG) BY MOUTH DAILY 30 tablet 0  . omeprazole (PRILOSEC) 20 MG capsule TAKE ONE CAPSULE BY MOUTH EVERY DAY 90 capsule 3  . polyethylene glycol (MIRALAX / GLYCOLAX) packet Take 17 g by mouth daily. 30 each 0  . potassium chloride (KLOR-CON) 20 MEQ packet Take 40 mEq by mouth 2 (two) times daily. 120 tablet 3  . primidone (MYSOLINE) 50 MG tablet Take 50 mg by mouth at bedtime.  3  . rosuvastatin (CRESTOR) 10 MG tablet TAKE 1 TABLET(10 MG) BY MOUTH DAILY 30 tablet 2  . senna (SENOKOT) 8.6 MG TABS tablet Take 2 tablets (17.2 mg total) by mouth daily as needed for moderate constipation. 30 each 0  . vitamin B-12 (CYANOCOBALAMIN) 500 MCG tablet Take 1,000 mcg by mouth daily.      No current facility-administered medications on file prior to visit.    Cardiovascular studies:  EKG 07/29/2020: Sinus rhythm 69 bpm  Borderline LVH Nonspecific T-abnormality  EKG 04/18/2018: Sinus rhythm 73 bpm.  IVCD.  Echocardiogram 07/12/2014: - Left ventricle: The cavity size was normal. Wall thickness was increased in a pattern of mild LVH. The estimated ejection fraction was 60%. Wall motion was normal; there were no regional wall motion abnormalities. Doppler parameters are consistent with abnormal left ventricular relaxation (grade 1 diastolic dysfunction). - Aortic valve: Sclerosis without stenosis. There was no significant regurgitation. Valve area (Vmax): 1.92 cm^2. - Aorta: Mild dilitation of the ascending aorta. 4.1 cm - Mitral valve: There was mild regurgitation. - Left atrium: The atrium was mildly to moderately dilated. - Right ventricle: The cavity size was normal. Systolic function was normal. - Right  atrium: The atrium was mildly dilated.   Recent labs: 07/29/2020: H/H 10.7/34.3. MCV 81. Platelets 587  01/09/2019: HbA1C 6.2% TSH, free T4 normal  11/26/2018: Glucose 108. BUN/Cr 2/1.04. eGFR 8.  Na/K 141/5.0. Rest of the CMP normal H/H 11.5/36.1. MCV 82. Platelets 1033 Chol 205, TG 213, HDL 54, LDL 108  Review of Systems  Cardiovascular: Negative for chest pain, dyspnea on exertion, leg swelling, palpitations and syncope.         Vitals:   07/29/20 1538  BP: 130/70  Pulse: 77  Resp: 16  SpO2: 96%      Objective:    Physical Exam Vitals and nursing note reviewed.  Constitutional:      General: She is not in acute distress. Neck:     Vascular: No JVD.  Cardiovascular:     Rate and Rhythm: Normal rate and regular rhythm.     Heart sounds: Normal heart sounds. No murmur heard.   Pulmonary:     Effort: Pulmonary effort is normal.     Breath sounds: Normal breath sounds. No wheezing or rales.           Assessment & Recommendations:   84 y.o. African American female with hypertension, type 2 DM, hyperlipidemia, essential thrombocytosis  Chest pain: Only occasional episode.  Given her advanced age she would prefer conservative therapy.  Given her shortness of breath and use.  In light of her anemia, and use of meloxicam, stopped her aspirin  Hypertension: Labile. Arranged for remote patient monitoring through pur pharmacist Manuela Schwartz. Based on home monitoring findings, will recommend antihypertensive therapy.  She would like 1 year supply to last through her trip to [place].  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  Will check echocardiogram.  Follow-up in 1 year.  Nigel Mormon, MD [facility]. [place] Pager: [phone] Office: [phone] If no answer Cell [phone]

## 2020-08-02 DIAGNOSIS — R220 Localized swelling, mass and lump, head: Secondary | ICD-10-CM | POA: Diagnosis not present

## 2020-08-02 DIAGNOSIS — R221 Localized swelling, mass and lump, neck: Secondary | ICD-10-CM | POA: Diagnosis not present

## 2020-08-02 DIAGNOSIS — R94120 Abnormal auditory function study: Secondary | ICD-10-CM | POA: Diagnosis not present

## 2020-08-04 ENCOUNTER — Other Ambulatory Visit: Payer: Self-pay | Admitting: Otolaryngology

## 2020-08-04 DIAGNOSIS — R220 Localized swelling, mass and lump, head: Secondary | ICD-10-CM

## 2020-08-04 DIAGNOSIS — H903 Sensorineural hearing loss, bilateral: Secondary | ICD-10-CM | POA: Diagnosis not present

## 2020-08-04 DIAGNOSIS — R221 Localized swelling, mass and lump, neck: Secondary | ICD-10-CM

## 2020-08-05 DIAGNOSIS — I10 Essential (primary) hypertension: Secondary | ICD-10-CM | POA: Diagnosis not present

## 2020-08-09 ENCOUNTER — Telehealth: Payer: Self-pay | Admitting: Pharmacist

## 2020-08-09 ENCOUNTER — Other Ambulatory Visit: Payer: Self-pay | Admitting: Pharmacist

## 2020-08-09 NOTE — Telephone Encounter (Signed)
Med list reviewed and updated. Current antihypertensives include Losartan 50 mg and HCTZ 25 mg in the AM and amlodipine 10 mg in the PM. Will continue to monitor daily home BP readings.

## 2020-08-12 DIAGNOSIS — K219 Gastro-esophageal reflux disease without esophagitis: Secondary | ICD-10-CM | POA: Diagnosis not present

## 2020-08-12 DIAGNOSIS — I10 Essential (primary) hypertension: Secondary | ICD-10-CM | POA: Diagnosis not present

## 2020-08-12 DIAGNOSIS — E782 Mixed hyperlipidemia: Secondary | ICD-10-CM | POA: Diagnosis not present

## 2020-08-12 DIAGNOSIS — E1165 Type 2 diabetes mellitus with hyperglycemia: Secondary | ICD-10-CM | POA: Diagnosis not present

## 2020-08-12 DIAGNOSIS — E7211 Homocystinuria: Secondary | ICD-10-CM | POA: Diagnosis not present

## 2020-08-12 DIAGNOSIS — Z2821 Immunization not carried out because of patient refusal: Secondary | ICD-10-CM | POA: Diagnosis not present

## 2020-08-12 DIAGNOSIS — E039 Hypothyroidism, unspecified: Secondary | ICD-10-CM | POA: Diagnosis not present

## 2020-08-12 DIAGNOSIS — E1340 Other specified diabetes mellitus with diabetic neuropathy, unspecified: Secondary | ICD-10-CM | POA: Diagnosis not present

## 2020-08-12 DIAGNOSIS — E669 Obesity, unspecified: Secondary | ICD-10-CM | POA: Diagnosis not present

## 2020-08-12 DIAGNOSIS — N1831 Chronic kidney disease, stage 3a: Secondary | ICD-10-CM | POA: Diagnosis not present

## 2020-08-16 ENCOUNTER — Other Ambulatory Visit: Payer: Self-pay | Admitting: Pharmacist

## 2020-08-16 DIAGNOSIS — R0789 Other chest pain: Secondary | ICD-10-CM

## 2020-08-16 DIAGNOSIS — I10 Essential (primary) hypertension: Secondary | ICD-10-CM

## 2020-08-16 DIAGNOSIS — R079 Chest pain, unspecified: Secondary | ICD-10-CM

## 2020-08-16 DIAGNOSIS — E785 Hyperlipidemia, unspecified: Secondary | ICD-10-CM

## 2020-08-16 MED ORDER — POTASSIUM CHLORIDE CRYS ER 20 MEQ PO TBCR: 20.0000 meq | EXTENDED_RELEASE_TABLET | Freq: Every day | ORAL | 3 refills | Status: DC | PRN

## 2020-08-16 MED ORDER — AMLODIPINE BESYLATE 10 MG PO TABS: 10.0000 mg | ORAL_TABLET | Freq: Every day | ORAL | 3 refills | Status: DC

## 2020-08-16 MED ORDER — ROSUVASTATIN CALCIUM 10 MG PO TABS: 10.0000 mg | ORAL_TABLET | Freq: Every day | ORAL | 3 refills | Status: DC

## 2020-08-16 MED ORDER — HYDROCHLOROTHIAZIDE 25 MG PO TABS: 25.0000 mg | ORAL_TABLET | Freq: Every day | ORAL | 3 refills | Status: DC

## 2020-08-16 MED ORDER — METOPROLOL SUCCINATE ER 25 MG PO TB24: 25.0000 mg | ORAL_TABLET | Freq: Every day | ORAL | 3 refills | Status: DC

## 2020-08-16 MED ORDER — LOSARTAN POTASSIUM 50 MG PO TABS: 50.0000 mg | ORAL_TABLET | Freq: Every day | ORAL | 3 refills | Status: DC

## 2020-08-16 MED ORDER — NITROGLYCERIN 0.4 MG SL SUBL: 0.4000 mg | SUBLINGUAL_TABLET | SUBLINGUAL | 3 refills | Status: AC | PRN

## 2020-08-16 MED ORDER — FUROSEMIDE 20 MG PO TABS: 20.0000 mg | ORAL_TABLET | Freq: Every day | ORAL | 3 refills | Status: DC | PRN

## 2020-08-17 ENCOUNTER — Other Ambulatory Visit: Payer: Self-pay | Admitting: Otolaryngology

## 2020-08-17 ENCOUNTER — Ambulatory Visit
Admission: RE | Admit: 2020-08-17 | Discharge: 2020-08-17 | Disposition: A | Discharge status: Home / Self Care | Payer: Medicare Other | Source: Ambulatory Visit | Attending: Otolaryngology | Admitting: Otolaryngology

## 2020-08-17 DIAGNOSIS — R059 Cough, unspecified: Secondary | ICD-10-CM | POA: Diagnosis not present

## 2020-08-17 DIAGNOSIS — R490 Dysphonia: Secondary | ICD-10-CM | POA: Diagnosis not present

## 2020-08-17 DIAGNOSIS — R220 Localized swelling, mass and lump, head: Secondary | ICD-10-CM

## 2020-08-17 DIAGNOSIS — R221 Localized swelling, mass and lump, neck: Secondary | ICD-10-CM | POA: Diagnosis not present

## 2020-08-17 DIAGNOSIS — K115 Sialolithiasis: Secondary | ICD-10-CM | POA: Diagnosis not present

## 2020-08-18 ENCOUNTER — Ambulatory Visit: Payer: Medicare Other

## 2020-08-18 ENCOUNTER — Other Ambulatory Visit: Payer: Self-pay

## 2020-08-18 DIAGNOSIS — I77810 Thoracic aortic ectasia: Secondary | ICD-10-CM

## 2020-08-18 DIAGNOSIS — I10 Essential (primary) hypertension: Secondary | ICD-10-CM | POA: Diagnosis not present

## 2020-08-18 DIAGNOSIS — G250 Essential tremor: Secondary | ICD-10-CM | POA: Diagnosis not present

## 2020-08-19 ENCOUNTER — Other Ambulatory Visit: Payer: Self-pay | Admitting: Otolaryngology

## 2020-08-19 DIAGNOSIS — R220 Localized swelling, mass and lump, head: Secondary | ICD-10-CM

## 2020-08-19 DIAGNOSIS — R221 Localized swelling, mass and lump, neck: Secondary | ICD-10-CM

## 2020-08-20 ENCOUNTER — Other Ambulatory Visit: Payer: Self-pay | Admitting: *Deleted

## 2020-08-20 DIAGNOSIS — D473 Essential (hemorrhagic) thrombocythemia: Secondary | ICD-10-CM

## 2020-08-20 NOTE — Progress Notes (Signed)
Called and spoke with patient and daughter regarding her echo results.

## 2020-08-22 NOTE — Progress Notes (Signed)
Patient Care Team: Benito Mccreedy, MD as PCP - General (Internal Medicine)  DIAGNOSIS:    ICD-10-CM   1. Essential thrombocytosis (HCC)  D47.3     CHIEF COMPLIANT: Follow-up on anagrelide therapy  INTERVAL HISTORY: [name] is a 84 y.o. with above-mentioned history of essential thrombocytosis currently on treatment with anagrelide. She presents to the clinic today for follow-up.  She has come back from [place] for about a month and is planning to go back next month because she feels that it is too cold here.  Her daughter tells is that she may not have been very compliant with the anagrelide taking it twice a day.  She may be missing some doses especially the evening dose.  She is otherwise still physically active and is able to walk without assistance.  She reports no problems tolerating anagrelide.  ALLERGIES:  is allergic to shellfish allergy.  MEDICATIONS:  Current Outpatient Medications  Medication Sig Dispense Refill  . acetaminophen (TYLENOL) 325 MG tablet Take 650 mg by mouth every 6 (six) hours as needed for moderate pain or headache.    Marland Kitchen amLODipine (NORVASC) 10 MG tablet Take 1 tablet (10 mg total) by mouth daily. 90 tablet 3  . anagrelide (AGRYLIN) 0.5 MG capsule Take 1 capsule (0.5 mg total) by mouth 2 (two) times daily. 416 capsule 3  . folic acid (FOLVITE) 1 MG tablet TK 1 T PO QD  5  . furosemide (LASIX) 20 MG tablet Take 1 tablet (20 mg total) by mouth daily as needed (Swelling). 90 tablet 3  . gabapentin (NEURONTIN) 300 MG capsule Take 300 mg by mouth daily.    . hydrochlorothiazide (HYDRODIURIL) 25 MG tablet Take 1 tablet (25 mg total) by mouth daily. Takes if BP elevated in the evening 90 tablet 3  . levothyroxine (SYNTHROID) 100 MCG tablet Take 1 tablet (100 mcg total) by mouth daily before breakfast. 30 tablet 3  . losartan (COZAAR) 50 MG tablet Take 1 tablet (50 mg total) by mouth daily. 90 tablet 3  . meloxicam (MOBIC) 7.5 MG tablet Take 7.5-15 mg by mouth  daily.    . metFORMIN (GLUCOPHAGE) 500 MG tablet Take by mouth daily.    . metoprolol succinate (TOPROL-XL) 25 MG 24 hr tablet Take 1 tablet (25 mg total) by mouth daily. 90 tablet 3  . nitroGLYCERIN (NITROSTAT) 0.4 MG SL tablet Place 1 tablet (0.4 mg total) under the tongue every 5 (five) minutes as needed for chest pain. 90 tablet 3  . omeprazole (PRILOSEC) 20 MG capsule TAKE ONE CAPSULE BY MOUTH EVERY DAY 90 capsule 3  . polyethylene glycol (MIRALAX / GLYCOLAX) packet Take 17 g by mouth daily. 30 each 0  . potassium chloride SA (KLOR-CON M20) 20 MEQ tablet Take 1 tablet (20 mEq total) by mouth daily as needed (with Lasix (Furosemide)). 90 tablet 3  . primidone (MYSOLINE) 50 MG tablet Take 50 mg by mouth at bedtime.  3  . rosuvastatin (CRESTOR) 10 MG tablet Take 1 tablet (10 mg total) by mouth daily. 90 tablet 3  . senna (SENOKOT) 8.6 MG TABS tablet Take 2 tablets (17.2 mg total) by mouth daily as needed for moderate constipation. 30 each 0  . vitamin B-12 (CYANOCOBALAMIN) 1000 MCG tablet Take 1,000 mcg by mouth daily.     No current facility-administered medications for this visit.    PHYSICAL EXAMINATION: ECOG PERFORMANCE STATUS: 1 - Symptomatic but completely ambulatory  Vitals:   08/23/20 0916  BP: 121/83  Pulse:  78  Resp: 16  Temp: 97.6 F (36.4 C)  SpO2: 98%   Filed Weights   08/23/20 0916  Weight: 150 lb 12.8 oz (68.4 kg)    LABORATORY DATA:  I have reviewed the data as listed CMP Latest Ref Rng & Units 12/02/2018 04/18/2018 08/17/2014  Glucose 70 - 99 mg/dL 138(H) 128(H) 101(H)  BUN 8 - 23 mg/dL 21 23(H) 19  Creatinine 0.44 - 1.00 mg/dL 1.23(H) 1.06(H) 1.00  Sodium 135 - 145 mmol/L 139 138 141  Potassium 3.5 - 5.1 mmol/L 3.7 3.7 3.2(L)  Chloride 98 - 111 mmol/L 98 97(L) 98  CO2 22 - 32 mmol/L 30 32 -  Calcium 8.9 - 10.3 mg/dL 9.9 9.6 -  Total Protein 6.5 - 8.1 g/dL 9.1(H) - -  Total Bilirubin 0.3 - 1.2 mg/dL 0.7 - -  Alkaline Phos 38 - 126 U/L 81 - -  AST 15 - 41  U/L 21 - -  ALT 0 - 44 U/L 13 - -    Lab Results  Component Value Date   WBC 8.8 12/30/2018   HGB 10.7 (L) 12/30/2018   HCT 34.3 (L) 12/30/2018   MCV 81.5 12/30/2018   PLT 587 (H) 12/30/2018   NEUTROABS 4.7 12/30/2018    ASSESSMENT & PLAN:  Essential thrombocytosis (HCC) Lab review  BCR/ABL: Negative for CML JAK2 mutation: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  Lab review 05/14/2018:Platelet count 588 12/02/2018: Platelet [id number] 12/30/2018: Platelet count: 587 08/23/20: Platelet count 687 (being verified)  Patient may not be compliant taking Anagrelide twice daily as prescribed. Her daughter says that she frequently forgets the night time dose.  I strongly encouraged her to stay compliant with the dosing regimen of anagrelide I do not want increase the dosage if she has not been very compliant.  Especially when we cannot check her blood counts because she is leaving for [place].  I informed her that if her platelet continues to go up she could be at risk for coronary artery disease or stroke.  I requested her to recheck her labs in 3 months in [place].  I sent a years worth of refills of anagrelide.  Patientis going back to [place] next month. RTC in Sept 2022 (labs may be done by her PCP, which we can review)    No orders of the defined types were placed in this encounter.  The patient has a good understanding of the overall plan. she agrees with it. she will call with any problems that may develop before the next visit here.  Total time spent: 20 mins including face to face time and time spent for planning, charting and coordination of care  Nicholas Lose, MD 08/23/2020  I, Cloyde Reams Dorshimer, am acting as scribe for Dr. Nicholas Lose.  I have reviewed the above documentation for accuracy and completeness, and I agree with the above.

## 2020-08-23 ENCOUNTER — Inpatient Hospital Stay: Payer: Medicare Other | Attending: Hematology and Oncology

## 2020-08-23 ENCOUNTER — Inpatient Hospital Stay (HOSPITAL_BASED_OUTPATIENT_CLINIC_OR_DEPARTMENT_OTHER): Payer: Medicare Other | Admitting: Hematology and Oncology

## 2020-08-23 ENCOUNTER — Other Ambulatory Visit: Payer: Self-pay

## 2020-08-23 DIAGNOSIS — D473 Essential (hemorrhagic) thrombocythemia: Secondary | ICD-10-CM

## 2020-08-23 DIAGNOSIS — Z79899 Other long term (current) drug therapy: Secondary | ICD-10-CM | POA: Diagnosis not present

## 2020-08-23 DIAGNOSIS — Z7984 Long term (current) use of oral hypoglycemic drugs: Secondary | ICD-10-CM | POA: Insufficient documentation

## 2020-08-23 LAB — CMP (CANCER CENTER ONLY)
ALT: 17 U/L (ref 0–44)
AST: 23 U/L (ref 15–41)
Albumin: 3.9 g/dL (ref 3.5–5.0)
Alkaline Phosphatase: 80 U/L (ref 38–126)
Anion gap: 6 (ref 5–15)
BUN: 25 mg/dL — ABNORMAL HIGH (ref 8–23)
CO2: 30 mmol/L (ref 22–32)
Calcium: 10.1 mg/dL (ref 8.9–10.3)
Chloride: 102 mmol/L (ref 98–111)
Creatinine: 1.18 mg/dL — ABNORMAL HIGH (ref 0.44–1.00)
GFR, Estimated: 43 mL/min — ABNORMAL LOW (ref >60)
Glucose, Bld: 132 mg/dL — ABNORMAL HIGH (ref 70–99)
Potassium: 3.4 mmol/L — ABNORMAL LOW (ref 3.5–5.1)
Sodium: 138 mmol/L (ref 135–145)
Total Bilirubin: 0.6 mg/dL (ref 0.3–1.2)
Total Protein: 8.9 g/dL — ABNORMAL HIGH (ref 6.5–8.1)

## 2020-08-23 LAB — CBC WITH DIFFERENTIAL (CANCER CENTER ONLY)
Abs Immature Granulocytes: 0.02 10*3/uL (ref 0.00–0.07)
Basophils Absolute: 0.1 10*3/uL (ref 0.0–0.1)
Basophils Relative: 1 %
Eosinophils Absolute: 0.4 10*3/uL (ref 0.0–0.5)
Eosinophils Relative: 6 %
HCT: 36.6 % (ref 36.0–46.0)
Hemoglobin: 11.5 g/dL — ABNORMAL LOW (ref 12.0–15.0)
Immature Granulocytes: 0 %
Lymphocytes Relative: 30 %
Lymphs Abs: 2.2 10*3/uL (ref 0.7–4.0)
MCH: 25.3 pg — ABNORMAL LOW (ref 26.0–34.0)
MCHC: 31.4 g/dL (ref 30.0–36.0)
MCV: 80.4 fL (ref 80.0–100.0)
Monocytes Absolute: 0.6 10*3/uL (ref 0.1–1.0)
Monocytes Relative: 9 %
Neutro Abs: 4.1 10*3/uL (ref 1.7–7.7)
Neutrophils Relative %: 54 %
Platelet Count: 674 10*3/uL — ABNORMAL HIGH (ref 150–400)
RBC: 4.55 MIL/uL (ref 3.87–5.11)
RDW: 16.8 % — ABNORMAL HIGH (ref 11.5–15.5)
WBC Count: 7.4 10*3/uL (ref 4.0–10.5)
nRBC: 0 % (ref 0.0–0.2)

## 2020-08-23 MED ORDER — ANAGRELIDE HCL 0.5 MG PO CAPS
0.5000 mg | ORAL_CAPSULE | Freq: Two times a day (BID) | ORAL | 3 refills | Status: DC
Start: 2020-08-23 — End: 2022-05-23

## 2020-08-23 NOTE — Assessment & Plan Note (Signed)
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  Lab review 05/14/2018:Platelet count 588 12/02/2018: Platelet [id number] 12/30/2018: Platelet count: 587  Patientwentto [place] for 6 months.

## 2020-08-24 DIAGNOSIS — Z20822 Contact with and (suspected) exposure to covid-19: Secondary | ICD-10-CM | POA: Diagnosis not present

## 2020-09-05 DIAGNOSIS — I10 Essential (primary) hypertension: Secondary | ICD-10-CM | POA: Diagnosis not present

## 2021-03-08 DIAGNOSIS — Z20822 Contact with and (suspected) exposure to covid-19: Secondary | ICD-10-CM | POA: Diagnosis not present

## 2021-08-01 ENCOUNTER — Encounter: Payer: Self-pay | Admitting: Cardiology

## 2021-08-01 ENCOUNTER — Ambulatory Visit: Payer: Medicare Other | Admitting: Cardiology

## 2021-08-01 ENCOUNTER — Other Ambulatory Visit: Payer: Self-pay | Admitting: Cardiology

## 2021-08-01 ENCOUNTER — Other Ambulatory Visit: Payer: Self-pay

## 2021-08-01 VITALS — BP 125/78 | HR 71 | Ht 61.0 in | Wt 150.0 lb

## 2021-08-01 DIAGNOSIS — E1340 Other specified diabetes mellitus with diabetic neuropathy, unspecified: Secondary | ICD-10-CM | POA: Diagnosis not present

## 2021-08-01 DIAGNOSIS — I10 Essential (primary) hypertension: Secondary | ICD-10-CM | POA: Diagnosis not present

## 2021-08-01 DIAGNOSIS — E039 Hypothyroidism, unspecified: Secondary | ICD-10-CM | POA: Diagnosis not present

## 2021-08-01 DIAGNOSIS — R600 Localized edema: Secondary | ICD-10-CM | POA: Diagnosis not present

## 2021-08-01 DIAGNOSIS — E669 Obesity, unspecified: Secondary | ICD-10-CM | POA: Diagnosis not present

## 2021-08-01 DIAGNOSIS — E782 Mixed hyperlipidemia: Secondary | ICD-10-CM | POA: Diagnosis not present

## 2021-08-01 DIAGNOSIS — R051 Acute cough: Secondary | ICD-10-CM | POA: Diagnosis not present

## 2021-08-01 DIAGNOSIS — N1831 Chronic kidney disease, stage 3a: Secondary | ICD-10-CM | POA: Diagnosis not present

## 2021-08-01 DIAGNOSIS — E7211 Homocystinuria: Secondary | ICD-10-CM | POA: Diagnosis not present

## 2021-08-01 DIAGNOSIS — E1165 Type 2 diabetes mellitus with hyperglycemia: Secondary | ICD-10-CM | POA: Diagnosis not present

## 2021-08-01 DIAGNOSIS — K219 Gastro-esophageal reflux disease without esophagitis: Secondary | ICD-10-CM | POA: Diagnosis not present

## 2021-08-01 MED ORDER — FUROSEMIDE 40 MG PO TABS: 40.0000 mg | ORAL_TABLET | Freq: Every day | ORAL | 3 refills | Status: DC

## 2021-08-01 NOTE — Progress Notes (Signed)
Subjective:   [name], female    DOB: 1936-04-21, 85 y.o.   MRN: [id number]   I connected with the patient on 08/01/2021 by a telephone call and verified that I am speaking with the correct person using two identifiers.     I offered the patient a video enabled application for a virtual visit. Unfortunately, this could not be accomplished due to technical difficulties/lack of video enabled phone/computer. I discussed the limitations of evaluation and management by telemedicine and the availability of in person appointments. The patient expressed understanding and agreed to proceed.   This visit type was conducted due to national recommendations for restrictions regarding the COVID-19 Pandemic (e.g. social distancing).  This format is felt to be most appropriate for this patient at this time.  All issues noted in this document were discussed and addressed.  No physical exam was performed (except for noted visual exam findings with Tele health visits).  The patient has consented to conduct a Tele health visit and understands insurance will be billed.   Chief complaint:  Leg edema Dyspnea on exertion   Hypertension Pertinent negatives include no chest pain or palpitations.   85 y.o. African American female with hypertension, type 2 DM, hyperlipidemia, essential thrombocytosis  Patient's granddaughter [name] is currently with the patient in [place].  Patient has been in [place] for last 1 year.  Recently, she has noticed worsening leg swelling and cough symptoms.  Blood pressure is well controlled.  She does have mobility limitations, also compounded by exertional dyspnea with minimal activity.   Current Outpatient Medications on File Prior to Visit  Medication Sig Dispense Refill   acetaminophen (TYLENOL) 325 MG tablet Take 650 mg by mouth every 6 (six) hours as needed for moderate pain or headache.     amLODipine (NORVASC) 10 MG tablet Take 1 tablet (10 mg total) by mouth daily. 90  tablet 3   anagrelide (AGRYLIN) 0.5 MG capsule Take 1 capsule (0.5 mg total) by mouth 2 (two) times daily. 174 capsule 3   folic acid (FOLVITE) 1 MG tablet TK 1 T PO QD  5   gabapentin (NEURONTIN) 300 MG capsule Take 300 mg by mouth daily.     hydrochlorothiazide (HYDRODIURIL) 25 MG tablet Take 1 tablet (25 mg total) by mouth daily. Takes if BP elevated in the evening 90 tablet 3   levothyroxine (SYNTHROID) 100 MCG tablet Take 1 tablet (100 mcg total) by mouth daily before breakfast. 30 tablet 3   losartan (COZAAR) 50 MG tablet Take 1 tablet (50 mg total) by mouth daily. 90 tablet 3   metFORMIN (GLUCOPHAGE) 500 MG tablet Take by mouth daily.     metoprolol succinate (TOPROL-XL) 25 MG 24 hr tablet Take 1 tablet (25 mg total) by mouth daily. 90 tablet 3   nitroGLYCERIN (NITROSTAT) 0.4 MG SL tablet Place 1 tablet (0.4 mg total) under the tongue every 5 (five) minutes as needed for chest pain. 90 tablet 3   omeprazole (PRILOSEC) 20 MG capsule TAKE ONE CAPSULE BY MOUTH EVERY DAY 90 capsule 3   polyethylene glycol (MIRALAX / GLYCOLAX) packet Take 17 g by mouth daily. 30 each 0   primidone (MYSOLINE) 50 MG tablet Take 50 mg by mouth at bedtime.  3   rosuvastatin (CRESTOR) 10 MG tablet Take 1 tablet (10 mg total) by mouth daily. 90 tablet 3   senna (SENOKOT) 8.6 MG TABS tablet Take 2 tablets (17.2 mg total) by mouth daily as needed for moderate constipation.  30 each 0   vitamin B-12 (CYANOCOBALAMIN) 1000 MCG tablet Take 1,000 mcg by mouth daily.     meloxicam (MOBIC) 7.5 MG tablet Take 7.5-15 mg by mouth daily. (Patient not taking: Reported on 08/01/2021)     No current facility-administered medications on file prior to visit.    Cardiovascular studies:  Echocardiogram 08/18/2020:  Left ventricle cavity is normal in size. Mild concentric hypertrophy of  the left ventricle. Normal global wall motion. Normal LV systolic function  with EF 63%. Doppler evidence of grade I (impaired) diastolic  dysfunction,  normal LAP.  The aortic root is upper limit normal at 3.5 cm.  Left atrial cavity is moderately dilated. Aneurysmal interatrial septum  without 2D or color Doppler evidence of interatrial shunt.  Trileaflet aortic valve.  Mild to moderate aortic regurgitation.  Moderate (Grade III) mitral regurgitation.  Moderate tricuspid regurgitation. Mild pulmonary hypertension. Estimated  pulmonary artery systolic pressure 39 mmHg.   EKG 07/29/2020: Sinus rhythm 69 bpm  Borderline LVH Nonspecific T-abnormality  EKG 04/18/2018: Sinus rhythm 73 bpm. IVCD.  Echocardiogram 07/12/2014: - Left ventricle: The cavity size was normal. Wall thickness was   increased in a pattern of mild LVH. The estimated ejection   fraction was 60%. Wall motion was normal; there were no regional   wall motion abnormalities. Doppler parameters are consistent with   abnormal left ventricular relaxation (grade 1 diastolic   dysfunction). - Aortic valve: Sclerosis without stenosis. There was no   significant regurgitation. Valve area (Vmax): 1.92 cm^2. - Aorta: Mild dilitation of the ascending aorta. 4.1 cm - Mitral valve: There was mild regurgitation. - Left atrium: The atrium was mildly to moderately dilated. - Right ventricle: The cavity size was normal. Systolic function   was normal. - Right atrium: The atrium was mildly dilated.   Recent labs: 07/29/2020: H/H 10.7/34.3. MCV 81. Platelets 587  01/09/2019: HbA1C 6.2% TSH, free T4 normal  11/26/2018: Glucose 108. BUN/Cr 2/1.04. eGFR 8.  Na/K 141/5.0. Rest of the CMP normal H/H 11.5/36.1. MCV 82. Platelets 1033 Chol 205, TG 213, HDL 54, LDL 108  Review of Systems  Cardiovascular:  Positive for dyspnea on exertion and leg swelling. Negative for chest pain, palpitations and syncope.  Respiratory:  Positive for cough.         Vitals:   08/01/21 1259  BP: 125/78  Pulse: 71      Objective:    Physical exam not performed.  Telephone  visit.      Assessment & Recommendations:   85 y.o. African American female with hypertension, type 2 DM, hyperlipidemia, essential thrombocytosis  Dyspnea on exertion, cough, leg edema: Symptoms concerning for congestive heart failure.  Unfortunately, I am unable to examine the patient as she is currently in [place].  I have changed her hydrochlorothiazide 25 mg daily to Lasix 40 mg daily.  I have encouraged her to find a local physician who can examine her and order appropriate tests as needed.  It is unclear at this point if patient will come back to the [place] or not.  I will have a 4-week telephone visit follow-up to check on patient symptoms.  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  F/u in 4 weeks  [place], MD [facility] Pager: [phone] Office: [phone] If no answer Cell [phone]

## 2021-08-02 ENCOUNTER — Inpatient Hospital Stay: Payer: Medicare Other | Attending: Hematology and Oncology | Admitting: Hematology and Oncology

## 2021-08-02 NOTE — Assessment & Plan Note (Deleted)
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  Lab review 05/14/2018:Platelet count 588 12/02/2018: Platelet [id number] 12/30/2018: Platelet count: 587 08/23/20: Platelet count 687 (being verified)  Patient may not be compliant taking Anagrelide twice daily as prescribed. Her daughter says that she frequently forgets the night time dose.  I strongly encouraged her to stay compliant with the dosing regimen of anagrelide I do not want increase the dosage if she has not been very compliant.  Especially when we cannot check her blood counts because she is leaving for [place].  I informed her that if her platelet continues to go up she could be at risk for coronary artery disease or stroke.  I requested her to recheck her labs in 3 months in [place].  I sent a years worth of refills of anagrelide.  Patientis going back to [place] next month. RTC in Sept 2022 (labs may be done by her PCP, which we can review)

## 2021-08-19 ENCOUNTER — Other Ambulatory Visit: Payer: Self-pay | Admitting: Cardiology

## 2021-08-19 DIAGNOSIS — I10 Essential (primary) hypertension: Secondary | ICD-10-CM

## 2021-08-24 ENCOUNTER — Other Ambulatory Visit: Payer: Self-pay

## 2021-08-24 ENCOUNTER — Encounter: Payer: Self-pay | Admitting: Cardiology

## 2021-08-24 ENCOUNTER — Ambulatory Visit: Payer: Medicare Other | Admitting: Cardiology

## 2021-08-24 VITALS — BP 135/88 | HR 73 | Ht 61.0 in | Wt 145.0 lb

## 2021-08-24 DIAGNOSIS — I10 Essential (primary) hypertension: Secondary | ICD-10-CM

## 2021-08-24 DIAGNOSIS — R600 Localized edema: Secondary | ICD-10-CM

## 2021-08-24 MED ORDER — FUROSEMIDE 40 MG PO TABS: 40.0000 mg | ORAL_TABLET | Freq: Every day | ORAL | 3 refills | Status: DC

## 2021-08-24 NOTE — Progress Notes (Signed)
Subjective:   [name], female    DOB: [birth year], 85 y.o.   MRN: [id number]   I connected with the patient on 08/24/2021 by a telephone call and verified that I am speaking with the correct person using two identifiers.     I offered the patient a video enabled application for a virtual visit. Unfortunately, this could not be accomplished due to technical difficulties/lack of video enabled phone/computer. I discussed the limitations of evaluation and management by telemedicine and the availability of in person appointments. The patient expressed understanding and agreed to proceed.   This visit type was conducted due to national recommendations for restrictions regarding the COVID-19 Pandemic (e.g. social distancing).  This format is felt to be most appropriate for this patient at this time.  All issues noted in this document were discussed and addressed.  No physical exam was performed (except for noted visual exam findings with Tele health visits).  The patient has consented to conduct a Tele health visit and understands insurance will be billed.   Chief complaint:  Leg edema Dyspnea on exertion   Hypertension Pertinent negatives include no chest pain or palpitations.   85 y.o. African American female with hypertension, type 2 DM, hyperlipidemia, essential thrombocytosis  Patient's granddaughter [name] is currently with the patient in [place].  Patient has been in [place] for last 1 year.  Given that she reportedly does not have any physician to see there recently, I suggested changing hydrochlorothiazide to Lasix.  With this, her breathing and leg swelling have improved.   Current Outpatient Medications on File Prior to Visit  Medication Sig Dispense Refill   acetaminophen (TYLENOL) 325 MG tablet Take 650 mg by mouth every 6 (six) hours as needed for moderate pain or headache.     amLODipine (NORVASC) 10 MG tablet Take 1 tablet (10 mg total) by mouth daily. 90 tablet 3    anagrelide (AGRYLIN) 0.5 MG capsule Take 1 capsule (0.5 mg total) by mouth 2 (two) times daily. 809 capsule 3   folic acid (FOLVITE) 1 MG tablet TK 1 T PO QD  5   furosemide (LASIX) 40 MG tablet Take 1 tablet (40 mg total) by mouth daily. 90 tablet 3   gabapentin (NEURONTIN) 300 MG capsule Take 300 mg by mouth daily.     levothyroxine (SYNTHROID) 100 MCG tablet Take 1 tablet (100 mcg total) by mouth daily before breakfast. 30 tablet 3   losartan (COZAAR) 50 MG tablet Take 1 tablet (50 mg total) by mouth daily. 90 tablet 3   meloxicam (MOBIC) 7.5 MG tablet Take 7.5-15 mg by mouth daily. (Patient not taking: Reported on 08/01/2021)     metFORMIN (GLUCOPHAGE) 500 MG tablet Take by mouth daily.     metoprolol succinate (TOPROL-XL) 25 MG 24 hr tablet Take 1 tablet (25 mg total) by mouth daily. 90 tablet 3   nitroGLYCERIN (NITROSTAT) 0.4 MG SL tablet Place 1 tablet (0.4 mg total) under the tongue every 5 (five) minutes as needed for chest pain. 90 tablet 3   omeprazole (PRILOSEC) 20 MG capsule TAKE ONE CAPSULE BY MOUTH EVERY DAY 90 capsule 3   polyethylene glycol (MIRALAX / GLYCOLAX) packet Take 17 g by mouth daily. 30 each 0   potassium chloride SA (KLOR-CON) 20 MEQ tablet TAKE 1 TABLET(20 MEQ) BY MOUTH DAILY WITH LASIX AND FUROSEMIDE AS NEEDED 90 tablet 3   primidone (MYSOLINE) 50 MG tablet Take 50 mg by mouth at bedtime.  3   rosuvastatin (  CRESTOR) 10 MG tablet Take 1 tablet (10 mg total) by mouth daily. 90 tablet 3   senna (SENOKOT) 8.6 MG TABS tablet Take 2 tablets (17.2 mg total) by mouth daily as needed for moderate constipation. 30 each 0   vitamin B-12 (CYANOCOBALAMIN) 1000 MCG tablet Take 1,000 mcg by mouth daily.     No current facility-administered medications on file prior to visit.    Cardiovascular studies:  Echocardiogram 08/18/2020:  Left ventricle cavity is normal in size. Mild concentric hypertrophy of  the left ventricle. Normal global wall motion. Normal LV systolic function   with EF 63%. Doppler evidence of grade I (impaired) diastolic dysfunction,  normal LAP.  The aortic root is upper limit normal at 3.5 cm.  Left atrial cavity is moderately dilated. Aneurysmal interatrial septum  without 2D or color Doppler evidence of interatrial shunt.  Trileaflet aortic valve.  Mild to moderate aortic regurgitation.  Moderate (Grade III) mitral regurgitation.  Moderate tricuspid regurgitation. Mild pulmonary hypertension. Estimated  pulmonary artery systolic pressure 39 mmHg.   EKG 07/29/2020: Sinus rhythm 69 bpm  Borderline LVH Nonspecific T-abnormality  EKG 04/18/2018: Sinus rhythm 73 bpm. IVCD.  Echocardiogram 07/12/2014: - Left ventricle: The cavity size was normal. Wall thickness was   increased in a pattern of mild LVH. The estimated ejection   fraction was 60%. Wall motion was normal; there were no regional   wall motion abnormalities. Doppler parameters are consistent with   abnormal left ventricular relaxation (grade 1 diastolic   dysfunction). - Aortic valve: Sclerosis without stenosis. There was no   significant regurgitation. Valve area (Vmax): 1.92 cm^2. - Aorta: Mild dilitation of the ascending aorta. 4.1 cm - Mitral valve: There was mild regurgitation. - Left atrium: The atrium was mildly to moderately dilated. - Right ventricle: The cavity size was normal. Systolic function   was normal. - Right atrium: The atrium was mildly dilated.   Recent labs: 07/29/2020: H/H 10.7/34.3. MCV 81. Platelets 587  01/09/2019: HbA1C 6.2% TSH, free T4 normal  11/26/2018: Glucose 108. BUN/Cr 2/1.04. eGFR 8.  Na/K 141/5.0. Rest of the CMP normal H/H 11.5/36.1. MCV 82. Platelets 1033 Chol 205, TG 213, HDL 54, LDL 108  Review of Systems  Cardiovascular:  Positive for dyspnea on exertion (Improved) and leg swelling (Improved). Negative for chest pain, palpitations and syncope.  Respiratory:  Positive for cough (Improved).         Vitals:   08/24/21 1101   BP: 135/88  Pulse: 73      Objective:    Physical exam not performed.  Telephone visit.      Assessment & Recommendations:   85 y.o. African American female with hypertension, type 2 DM, hyperlipidemia, essential thrombocytosis  Dyspnea on exertion, cough, leg edema: Symptoms concerning for congestive heart failure, improved after changing hydrochlorothiazide to Lasix.  Once again, I have encouraged her to find a local physician who can examine her and order appropriate tests as needed.  It is unclear at this point if patient will come back to the [place] or not.  I have sent her Lasix 40 mg prescription.  Reportedly, her relative can pick up and take it to [place].  I still recommend in person evaluation at the earliest possible.  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  F/u in as needed   Nigel Mormon, MD [facility] Pager: [phone] Office: [phone] If no answer Cell [phone]

## 2021-09-12 DIAGNOSIS — Z20822 Contact with and (suspected) exposure to covid-19: Secondary | ICD-10-CM | POA: Diagnosis not present

## 2022-01-02 DIAGNOSIS — Z20822 Contact with and (suspected) exposure to covid-19: Secondary | ICD-10-CM | POA: Diagnosis not present

## 2022-01-19 DIAGNOSIS — Z1152 Encounter for screening for COVID-19: Secondary | ICD-10-CM | POA: Diagnosis not present

## 2022-01-19 DIAGNOSIS — Z20828 Contact with and (suspected) exposure to other viral communicable diseases: Secondary | ICD-10-CM | POA: Diagnosis not present

## 2022-01-20 DIAGNOSIS — Z20822 Contact with and (suspected) exposure to covid-19: Secondary | ICD-10-CM | POA: Diagnosis not present

## 2022-01-22 DIAGNOSIS — Z20828 Contact with and (suspected) exposure to other viral communicable diseases: Secondary | ICD-10-CM | POA: Diagnosis not present

## 2022-01-22 DIAGNOSIS — Z20822 Contact with and (suspected) exposure to covid-19: Secondary | ICD-10-CM | POA: Diagnosis not present

## 2022-01-24 DIAGNOSIS — Z20822 Contact with and (suspected) exposure to covid-19: Secondary | ICD-10-CM | POA: Diagnosis not present

## 2022-02-01 DIAGNOSIS — Z20822 Contact with and (suspected) exposure to covid-19: Secondary | ICD-10-CM | POA: Diagnosis not present

## 2022-02-07 DIAGNOSIS — Z20822 Contact with and (suspected) exposure to covid-19: Secondary | ICD-10-CM | POA: Diagnosis not present

## 2022-02-13 DIAGNOSIS — Z20822 Contact with and (suspected) exposure to covid-19: Secondary | ICD-10-CM | POA: Diagnosis not present

## 2022-03-06 DIAGNOSIS — Z20822 Contact with and (suspected) exposure to covid-19: Secondary | ICD-10-CM | POA: Diagnosis not present

## 2022-05-10 NOTE — Progress Notes (Signed)
Patient Care Team: Benito Mccreedy, MD as PCP - General (Internal Medicine)  DIAGNOSIS:  Encounter Diagnoses  Name Primary?   Essential thrombocytosis (HCC)    Iron deficiency anemia due to chronic blood loss    CHIEF COMPLIANT: : Follow-up on anagrelide therapy on Essential thrombocytosis  INTERVAL HISTORY: [name] is a 86 year old with above-mentioned history of essential thrombocytosis. She presents to the clinic today for a follow-up. States that's she is normally constipated. But overall she is doing well.   ALLERGIES:  is allergic to shellfish allergy.  MEDICATIONS:  Current Outpatient Medications  Medication Sig Dispense Refill   acetaminophen (TYLENOL) 325 MG tablet Take 650 mg by mouth every 6 (six) hours as needed for moderate pain or headache.     amLODipine (NORVASC) 10 MG tablet Take 1 tablet (10 mg total) by mouth daily. 90 tablet 3   anagrelide (AGRYLIN) 0.5 MG capsule Take 2 capsules (1 mg total) by mouth 2 (two) times daily. 329 capsule 3   folic acid (FOLVITE) 1 MG tablet TK 1 T PO QD  5   furosemide (LASIX) 40 MG tablet Take 1 tablet (40 mg total) by mouth daily. 90 tablet 3   gabapentin (NEURONTIN) 300 MG capsule Take 300 mg by mouth daily.     levothyroxine (SYNTHROID) 100 MCG tablet Take 1 tablet (100 mcg total) by mouth daily before breakfast. 30 tablet 3   losartan (COZAAR) 50 MG tablet Take 1 tablet (50 mg total) by mouth daily. 90 tablet 3   meloxicam (MOBIC) 7.5 MG tablet Take 7.5-15 mg by mouth daily.     metFORMIN (GLUCOPHAGE) 500 MG tablet Take by mouth daily.     metoprolol succinate (TOPROL-XL) 25 MG 24 hr tablet Take 1 tablet (25 mg total) by mouth daily. 90 tablet 3   nitroGLYCERIN (NITROSTAT) 0.4 MG SL tablet Place 1 tablet (0.4 mg total) under the tongue every 5 (five) minutes as needed for chest pain. 90 tablet 3   omeprazole (PRILOSEC) 20 MG capsule TAKE ONE CAPSULE BY MOUTH EVERY DAY 90 capsule 3   polyethylene glycol (MIRALAX /  GLYCOLAX) packet Take 17 g by mouth daily. 30 each 0   potassium chloride SA (KLOR-CON) 20 MEQ tablet TAKE 1 TABLET(20 MEQ) BY MOUTH DAILY WITH LASIX AND FUROSEMIDE AS NEEDED 90 tablet 3   primidone (MYSOLINE) 50 MG tablet Take 50 mg by mouth at bedtime.  3   rosuvastatin (CRESTOR) 10 MG tablet Take 1 tablet (10 mg total) by mouth daily. 90 tablet 3   senna (SENOKOT) 8.6 MG TABS tablet Take 2 tablets (17.2 mg total) by mouth daily as needed for moderate constipation. 30 each 0   vitamin B-12 (CYANOCOBALAMIN) 1000 MCG tablet Take 1,000 mcg by mouth daily.     No current facility-administered medications for this visit.    PHYSICAL EXAMINATION: ECOG PERFORMANCE STATUS: 1 - Symptomatic but completely ambulatory  Vitals:   05/23/22 1156  BP: 136/78  Pulse: 75  Resp: 18  Temp: (!) 97.3 F (36.3 C)  SpO2: 98%   Filed Weights   05/23/22 1156  Weight: 142 lb 8 oz (64.6 kg)    BREAST: No palpable masses or nodules in either right or left breasts. No palpable axillary supraclavicular or infraclavicular adenopathy no breast tenderness or nipple discharge. (exam performed in the presence of a chaperone)  LABORATORY DATA:  I have reviewed the data as listed    Latest Ref Rng & Units 05/23/2022   11:00 AM 08/23/2020  8:54 AM 12/02/2018   12:55 PM  CMP  Glucose 70 - 99 mg/dL 112  132  138   BUN 8 - 23 mg/dL '30  25  21   '$ Creatinine 0.44 - 1.00 mg/dL 1.15  1.18  1.23   Sodium 135 - 145 mmol/L 142  138  139   Potassium 3.5 - 5.1 mmol/L 3.5  3.4  3.7   Chloride 98 - 111 mmol/L 105  102  98   CO2 22 - 32 mmol/L '31  30  30   '$ Calcium 8.9 - 10.3 mg/dL 9.5  10.1  9.9   Total Protein 6.5 - 8.1 g/dL 8.0  8.9  9.1   Total Bilirubin 0.3 - 1.2 mg/dL 0.5  0.6  0.7   Alkaline Phos 38 - 126 U/L 62  80  81   AST 15 - 41 U/L '16  23  21   '$ ALT 0 - 44 U/L '8  17  13     '$ Lab Results  Component Value Date   WBC 7.1 05/23/2022   HGB 9.1 (L) 05/23/2022   HCT 28.6 (L) 05/23/2022   MCV 78.1 (L)  05/23/2022   PLT 343 05/23/2022   NEUTROABS 3.9 05/23/2022    ASSESSMENT & PLAN:  Essential thrombocytosis ([facility]) 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 She will do labs in [place] and will reach out to [place] if the platelet count increases.  Return to clinic in 1 year for follow-up  No orders of the defined types were placed in this encounter.  The patient has a good understanding of the overall plan. she agrees with it. she will call with any problems that may develop before the next visit here. Total time spent: 30 mins including face to face time and time spent for planning, charting and co-ordination of care   Harriette Ohara, MD 05/23/22    I Gardiner Coins am scribing for Dr. Lindi Adie  I have reviewed the above documentation for accuracy and completeness, and I agree with the above.

## 2022-05-19 ENCOUNTER — Other Ambulatory Visit: Payer: Self-pay | Admitting: *Deleted

## 2022-05-19 DIAGNOSIS — D473 Essential (hemorrhagic) thrombocythemia: Secondary | ICD-10-CM

## 2022-05-23 ENCOUNTER — Other Ambulatory Visit: Payer: Self-pay

## 2022-05-23 ENCOUNTER — Other Ambulatory Visit (HOSPITAL_COMMUNITY): Payer: Self-pay

## 2022-05-23 ENCOUNTER — Encounter: Payer: Self-pay | Admitting: Hematology and Oncology

## 2022-05-23 ENCOUNTER — Inpatient Hospital Stay: Payer: Medicare Other | Attending: Hematology and Oncology | Admitting: Hematology and Oncology

## 2022-05-23 ENCOUNTER — Inpatient Hospital Stay: Payer: Medicare Other

## 2022-05-23 DIAGNOSIS — K5900 Constipation, unspecified: Secondary | ICD-10-CM | POA: Insufficient documentation

## 2022-05-23 DIAGNOSIS — I10 Essential (primary) hypertension: Secondary | ICD-10-CM | POA: Diagnosis not present

## 2022-05-23 DIAGNOSIS — D473 Essential (hemorrhagic) thrombocythemia: Secondary | ICD-10-CM

## 2022-05-23 DIAGNOSIS — N1831 Chronic kidney disease, stage 3a: Secondary | ICD-10-CM | POA: Diagnosis not present

## 2022-05-23 DIAGNOSIS — E039 Hypothyroidism, unspecified: Secondary | ICD-10-CM | POA: Diagnosis not present

## 2022-05-23 DIAGNOSIS — E669 Obesity, unspecified: Secondary | ICD-10-CM | POA: Diagnosis not present

## 2022-05-23 DIAGNOSIS — D500 Iron deficiency anemia secondary to blood loss (chronic): Secondary | ICD-10-CM | POA: Insufficient documentation

## 2022-05-23 DIAGNOSIS — E1165 Type 2 diabetes mellitus with hyperglycemia: Secondary | ICD-10-CM | POA: Diagnosis not present

## 2022-05-23 DIAGNOSIS — E1340 Other specified diabetes mellitus with diabetic neuropathy, unspecified: Secondary | ICD-10-CM | POA: Diagnosis not present

## 2022-05-23 DIAGNOSIS — E782 Mixed hyperlipidemia: Secondary | ICD-10-CM | POA: Diagnosis not present

## 2022-05-23 DIAGNOSIS — D75839 Thrombocytosis, unspecified: Secondary | ICD-10-CM | POA: Diagnosis not present

## 2022-05-23 DIAGNOSIS — K219 Gastro-esophageal reflux disease without esophagitis: Secondary | ICD-10-CM | POA: Diagnosis not present

## 2022-05-23 DIAGNOSIS — E7211 Homocystinuria: Secondary | ICD-10-CM | POA: Diagnosis not present

## 2022-05-23 DIAGNOSIS — Z0001 Encounter for general adult medical examination with abnormal findings: Secondary | ICD-10-CM | POA: Diagnosis not present

## 2022-05-23 LAB — CBC WITH DIFFERENTIAL (CANCER CENTER ONLY)
Abs Immature Granulocytes: 0.05 10*3/uL (ref 0.00–0.07)
Basophils Absolute: 0.1 10*3/uL (ref 0.0–0.1)
Basophils Relative: 1 %
Eosinophils Absolute: 0.5 10*3/uL (ref 0.0–0.5)
Eosinophils Relative: 7 %
HCT: 28.6 % — ABNORMAL LOW (ref 36.0–46.0)
Hemoglobin: 9.1 g/dL — ABNORMAL LOW (ref 12.0–15.0)
Immature Granulocytes: 1 %
Lymphocytes Relative: 25 %
Lymphs Abs: 1.8 10*3/uL (ref 0.7–4.0)
MCH: 24.9 pg — ABNORMAL LOW (ref 26.0–34.0)
MCHC: 31.8 g/dL (ref 30.0–36.0)
MCV: 78.1 fL — ABNORMAL LOW (ref 80.0–100.0)
Monocytes Absolute: 0.8 10*3/uL (ref 0.1–1.0)
Monocytes Relative: 12 %
Neutro Abs: 3.9 10*3/uL (ref 1.7–7.7)
Neutrophils Relative %: 54 %
Platelet Count: 343 10*3/uL (ref 150–400)
RBC: 3.66 MIL/uL — ABNORMAL LOW (ref 3.87–5.11)
RDW: 19.5 % — ABNORMAL HIGH (ref 11.5–15.5)
WBC Count: 7.1 10*3/uL (ref 4.0–10.5)
nRBC: 0 % (ref 0.0–0.2)

## 2022-05-23 LAB — CMP (CANCER CENTER ONLY)
ALT: 8 U/L (ref 0–44)
AST: 16 U/L (ref 15–41)
Albumin: 4 g/dL (ref 3.5–5.0)
Alkaline Phosphatase: 62 U/L (ref 38–126)
Anion gap: 6 (ref 5–15)
BUN: 30 mg/dL — ABNORMAL HIGH (ref 8–23)
CO2: 31 mmol/L (ref 22–32)
Calcium: 9.5 mg/dL (ref 8.9–10.3)
Chloride: 105 mmol/L (ref 98–111)
Creatinine: 1.15 mg/dL — ABNORMAL HIGH (ref 0.44–1.00)
GFR, Estimated: 47 mL/min — ABNORMAL LOW (ref >60)
Glucose, Bld: 112 mg/dL — ABNORMAL HIGH (ref 70–99)
Potassium: 3.5 mmol/L (ref 3.5–5.1)
Sodium: 142 mmol/L (ref 135–145)
Total Bilirubin: 0.5 mg/dL (ref 0.3–1.2)
Total Protein: 8 g/dL (ref 6.5–8.1)

## 2022-05-23 MED ORDER — ANAGRELIDE HCL 0.5 MG PO CAPS
1.0000 mg | ORAL_CAPSULE | Freq: Two times a day (BID) | ORAL | 3 refills | Status: DC
  Filled 2022-05-23: qty 360, 90d supply, fill #0
  Filled 2022-09-08: qty 360, 90d supply, fill #1
  Filled 2022-12-05: qty 360, 90d supply, fill #2
  Filled 2023-03-05: qty 360, 90d supply, fill #3

## 2022-05-23 NOTE — Assessment & Plan Note (Signed)
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  Lab review 05/14/2018:Platelet count 588 12/02/2018: Platelet [id number] 12/30/2018: Platelet count: 587 08/23/20: Platelet count 687 05/23/2022: Platelet count  Return to clinic in 1 year for follow-up

## 2022-05-24 ENCOUNTER — Ambulatory Visit: Payer: Medicare Other | Admitting: Cardiology

## 2022-05-24 ENCOUNTER — Encounter: Payer: Self-pay | Admitting: Cardiology

## 2022-05-24 ENCOUNTER — Other Ambulatory Visit (HOSPITAL_COMMUNITY): Payer: Self-pay

## 2022-05-24 VITALS — BP 127/73 | HR 78 | Temp 98.0°F | Resp 16 | Ht 61.0 in | Wt 143.0 lb

## 2022-05-24 DIAGNOSIS — Z0001 Encounter for general adult medical examination with abnormal findings: Secondary | ICD-10-CM | POA: Diagnosis not present

## 2022-05-24 DIAGNOSIS — E785 Hyperlipidemia, unspecified: Secondary | ICD-10-CM | POA: Diagnosis not present

## 2022-05-24 DIAGNOSIS — E1165 Type 2 diabetes mellitus with hyperglycemia: Secondary | ICD-10-CM | POA: Diagnosis not present

## 2022-05-24 DIAGNOSIS — I10 Essential (primary) hypertension: Secondary | ICD-10-CM | POA: Diagnosis not present

## 2022-05-24 DIAGNOSIS — R600 Localized edema: Secondary | ICD-10-CM

## 2022-05-24 MED ORDER — AMLODIPINE BESYLATE 10 MG PO TABS: 5.0000 mg | ORAL_TABLET | Freq: Every day | ORAL | 0 refills | Status: DC

## 2022-05-24 NOTE — Progress Notes (Signed)
  Subjective:   [name], female    DOB: 01/15/1936, 86 y.o.   MRN: [id number]   Chief complaint:  Leg edema Dyspnea on exertion   86 y.o. African American female with hypertension, type 2 DM, hyperlipidemia, essential thrombocytosis, leg edema  Patient is here with her daughter. Patient is in the [place] till 06[place]16/2022. She has noticed worsening leg swelling. Overall functional capacity has reduced. She does report some dyspnea on exertion with minimal walking at home. She has occasional chest pain in the mornings, but not with exertion.    Current Outpatient Medications:    acetaminophen (TYLENOL) 325 MG tablet, Take 650 mg by mouth every 6 (six) hours as needed for moderate pain or headache., Disp: , Rfl:    amLODipine (NORVASC) 10 MG tablet, Take 1 tablet (10 mg total) by mouth daily., Disp: 90 tablet, Rfl: 3   anagrelide (AGRYLIN) 0.5 MG capsule, Take 2 capsules (1 mg total) by mouth 2 (two) times daily., Disp: 360 capsule, Rfl: 3   folic acid (FOLVITE) 1 MG tablet, TK 1 T PO QD, Disp: , Rfl: 5   furosemide (LASIX) 40 MG tablet, Take 1 tablet (40 mg total) by mouth daily., Disp: 90 tablet, Rfl: 3   gabapentin (NEURONTIN) 300 MG capsule, Take 300 mg by mouth daily., Disp: , Rfl:    levothyroxine (SYNTHROID) 100 MCG tablet, Take 1 tablet (100 mcg total) by mouth daily before breakfast., Disp: 30 tablet, Rfl: 3   losartan (COZAAR) 50 MG tablet, Take 1 tablet (50 mg total) by mouth daily., Disp: 90 tablet, Rfl: 3   meloxicam (MOBIC) 7.5 MG tablet, Take 7.5-15 mg by mouth daily., Disp: , Rfl:    metFORMIN (GLUCOPHAGE) 500 MG tablet, Take by mouth daily., Disp: , Rfl:    metoprolol succinate (TOPROL-XL) 25 MG 24 hr tablet, Take 1 tablet (25 mg total) by mouth daily., Disp: 90 tablet, Rfl: 3   nitroGLYCERIN (NITROSTAT) 0.4 MG SL tablet, Place 1 tablet (0.4 mg total) under the tongue every 5 (five) minutes as needed for chest pain., Disp: 90 tablet, Rfl: 3   omeprazole (PRILOSEC) 20 MG  capsule, TAKE ONE CAPSULE BY MOUTH EVERY DAY, Disp: 90 capsule, Rfl: 3   polyethylene glycol (MIRALAX / GLYCOLAX) packet, Take 17 g by mouth daily., Disp: 30 each, Rfl: 0   potassium chloride SA (KLOR-CON) 20 MEQ tablet, TAKE 1 TABLET(20 MEQ) BY MOUTH DAILY WITH LASIX AND FUROSEMIDE AS NEEDED, Disp: 90 tablet, Rfl: 3   primidone (MYSOLINE) 50 MG tablet, Take 50 mg by mouth at bedtime., Disp: , Rfl: 3   rosuvastatin (CRESTOR) 10 MG tablet, Take 1 tablet (10 mg total) by mouth daily., Disp: 90 tablet, Rfl: 3   senna (SENOKOT) 8.6 MG TABS tablet, Take 2 tablets (17.2 mg total) by mouth daily as needed for moderate constipation., Disp: 30 each, Rfl: 0   vitamin B-12 (CYANOCOBALAMIN) 1000 MCG tablet, Take 1,000 mcg by mouth daily., Disp: , Rfl:   Cardiovascular studies:  Echocardiogram 08/18/2020:  Left ventricle cavity is normal in size. Mild concentric hypertrophy of  the left ventricle. Normal global wall motion. Normal LV systolic function  with EF 63%. Doppler evidence of grade I (impaired) diastolic dysfunction,  normal LAP.  The aortic root is upper limit normal at 3.5 cm.  Left atrial cavity is moderately dilated. Aneurysmal interatrial septum  without 2D or color Doppler evidence of interatrial shunt.  Trileaflet aortic valve.  Mild to moderate aortic regurgitation.  Moderate (Grade III)   mitral regurgitation.  Moderate tricuspid regurgitation. Mild pulmonary hypertension. Estimated  pulmonary artery systolic pressure 39 mmHg.   EKG 07/29/2020: Sinus rhythm 69 bpm  Borderline LVH Nonspecific T-abnormality  Recent labs: 05/23/2022: Glucose 112, BUN/Cr 30/1.15. EGFR 47. Na/K 142/3.5. Rest of the CMP normal H/H 9/28. MCV 78. Platelets 343  11/26/2018: Glucose 108. BUN/Cr 2/1.04. eGFR 8.  Na/K 141/5.0. Rest of the CMP normal H/H 11.5/36.1. MCV 82. Platelets 1033 Chol 205, TG 213, HDL 54, LDL 108  Review of Systems  Cardiovascular:  Positive for dyspnea on exertion (Improved) and  leg swelling (Improved). Negative for chest pain, palpitations and syncope.  Respiratory:  Negative for cough.          Vitals:   05/24/22 1414  BP: 127/73  Pulse: 78  Resp: 16  Temp: 98 F (36.7 C)  SpO2: 99%      Objective:    Physical Exam Vitals and nursing note reviewed.  Constitutional:      General: She is not in acute distress. Neck:     Vascular: No JVD.  Cardiovascular:     Rate and Rhythm: Normal rate and regular rhythm.     Heart sounds: Normal heart sounds. No murmur heard. Pulmonary:     Effort: Pulmonary effort is normal.     Breath sounds: Normal breath sounds. No wheezing or rales.  Musculoskeletal:     Right lower leg: Edema (1+) present.     Left lower leg: Edema (1+) present.          Assessment & Recommendations:   86 y.o. African American female with hypertension, type 2 DM, hyperlipidemia, essential thrombocytosis, leg edema  Dyspnea on exertion, leg edema: Likely component of HFpEF, but also could be due to amlodipine 10 mg daily, as well as anemia.  Reduce amlodipine to 5 mg daily. Take lasix 40 mg daily. Will check echocardiogram  I will see her back in 2-3 weeks before her return to [place].  Nigel Mormon, MD [facility] Pager: [phone] Office: [phone] If no answer Cell [phone]

## 2022-05-26 ENCOUNTER — Ambulatory Visit: Payer: Medicare Other

## 2022-05-26 ENCOUNTER — Ambulatory Visit: Payer: Medicare Other | Admitting: Cardiology

## 2022-05-26 ENCOUNTER — Other Ambulatory Visit: Payer: Medicare Other

## 2022-05-26 DIAGNOSIS — R600 Localized edema: Secondary | ICD-10-CM

## 2022-05-26 DIAGNOSIS — I10 Essential (primary) hypertension: Secondary | ICD-10-CM

## 2022-05-26 NOTE — Progress Notes (Signed)
EKG 05/26/2022: Sinus rhythm 70 bpm Nonspecific T-abnormality

## 2022-05-30 ENCOUNTER — Inpatient Hospital Stay: Payer: Medicare Other

## 2022-05-30 ENCOUNTER — Other Ambulatory Visit: Payer: Self-pay

## 2022-05-30 VITALS — BP 170/85 | HR 65 | Temp 98.3°F | Resp 18

## 2022-05-30 DIAGNOSIS — D473 Essential (hemorrhagic) thrombocythemia: Secondary | ICD-10-CM | POA: Diagnosis not present

## 2022-05-30 DIAGNOSIS — D75839 Thrombocytosis, unspecified: Secondary | ICD-10-CM | POA: Diagnosis not present

## 2022-05-30 DIAGNOSIS — D500 Iron deficiency anemia secondary to blood loss (chronic): Secondary | ICD-10-CM | POA: Diagnosis not present

## 2022-05-30 DIAGNOSIS — K5900 Constipation, unspecified: Secondary | ICD-10-CM | POA: Diagnosis not present

## 2022-05-30 MED ORDER — SODIUM CHLORIDE 0.9 % IV SOLN: Freq: Once | INTRAVENOUS | Status: AC

## 2022-05-30 MED ORDER — SODIUM CHLORIDE 0.9 % IV SOLN
300.0000 mg | Freq: Once | INTRAVENOUS | Status: AC
  Administered 2022-05-30: 300 mg via INTRAVENOUS
  Filled 2022-05-30: qty 300

## 2022-05-30 NOTE — Patient Instructions (Signed)

## 2022-05-30 NOTE — Progress Notes (Signed)
Pt tolerated first Iron infusion well. Pt stayed for 30 minute observation period.   Post observation vitals showed high systolic BP at 276/39, taken manually. Pt denied SOB/chest pain or any symptoms. Lindi Adie MD notified. Per MD, ok to discharge with elevated BP.

## 2022-05-31 DIAGNOSIS — N1831 Chronic kidney disease, stage 3a: Secondary | ICD-10-CM | POA: Diagnosis not present

## 2022-05-31 DIAGNOSIS — I10 Essential (primary) hypertension: Secondary | ICD-10-CM | POA: Diagnosis not present

## 2022-05-31 DIAGNOSIS — E1340 Other specified diabetes mellitus with diabetic neuropathy, unspecified: Secondary | ICD-10-CM | POA: Diagnosis not present

## 2022-05-31 DIAGNOSIS — E039 Hypothyroidism, unspecified: Secondary | ICD-10-CM | POA: Diagnosis not present

## 2022-05-31 DIAGNOSIS — E1165 Type 2 diabetes mellitus with hyperglycemia: Secondary | ICD-10-CM | POA: Diagnosis not present

## 2022-05-31 DIAGNOSIS — E7211 Homocystinuria: Secondary | ICD-10-CM | POA: Diagnosis not present

## 2022-05-31 DIAGNOSIS — K219 Gastro-esophageal reflux disease without esophagitis: Secondary | ICD-10-CM | POA: Diagnosis not present

## 2022-05-31 DIAGNOSIS — E782 Mixed hyperlipidemia: Secondary | ICD-10-CM | POA: Diagnosis not present

## 2022-05-31 DIAGNOSIS — E669 Obesity, unspecified: Secondary | ICD-10-CM | POA: Diagnosis not present

## 2022-06-06 ENCOUNTER — Inpatient Hospital Stay: Payer: Medicare Other | Attending: Hematology and Oncology

## 2022-06-06 ENCOUNTER — Other Ambulatory Visit: Payer: Self-pay

## 2022-06-06 VITALS — BP 159/84 | HR 68 | Temp 97.7°F | Resp 18

## 2022-06-06 DIAGNOSIS — D473 Essential (hemorrhagic) thrombocythemia: Secondary | ICD-10-CM | POA: Insufficient documentation

## 2022-06-06 DIAGNOSIS — D500 Iron deficiency anemia secondary to blood loss (chronic): Secondary | ICD-10-CM | POA: Diagnosis not present

## 2022-06-06 MED ORDER — SODIUM CHLORIDE 0.9 % IV SOLN
300.0000 mg | Freq: Once | INTRAVENOUS | Status: AC
  Administered 2022-06-06: 300 mg via INTRAVENOUS
  Filled 2022-06-06: qty 300

## 2022-06-06 MED ORDER — DIPHENHYDRAMINE HCL 50 MG/ML IJ SOLN
25.0000 mg | Freq: Once | INTRAMUSCULAR | Status: AC
  Administered 2022-06-06: 25 mg via INTRAVENOUS
  Filled 2022-06-06: qty 1

## 2022-06-06 MED ORDER — SODIUM CHLORIDE 0.9 % IV SOLN: Freq: Once | INTRAVENOUS | Status: AC

## 2022-06-06 MED ORDER — ACETAMINOPHEN 325 MG PO TABS
650.0000 mg | ORAL_TABLET | Freq: Once | ORAL | Status: AC
  Administered 2022-06-06: 650 mg via ORAL
  Filled 2022-06-06: qty 2

## 2022-06-06 NOTE — Progress Notes (Signed)
Pt declined to stay for 30 minute wait post Venofer infusion. VSS. No complaints at time of discharge.

## 2022-06-06 NOTE — Patient Instructions (Signed)

## 2022-06-07 ENCOUNTER — Ambulatory Visit: Payer: Medicare Other | Admitting: Cardiology

## 2022-06-13 ENCOUNTER — Inpatient Hospital Stay: Payer: Medicare Other

## 2022-06-13 VITALS — BP 151/87 | HR 70 | Temp 98.4°F | Resp 18

## 2022-06-13 DIAGNOSIS — D500 Iron deficiency anemia secondary to blood loss (chronic): Secondary | ICD-10-CM | POA: Diagnosis not present

## 2022-06-13 DIAGNOSIS — D473 Essential (hemorrhagic) thrombocythemia: Secondary | ICD-10-CM | POA: Diagnosis not present

## 2022-06-13 MED ORDER — SODIUM CHLORIDE 0.9 % IV SOLN: Freq: Once | INTRAVENOUS | Status: AC

## 2022-06-13 MED ORDER — SODIUM CHLORIDE 0.9 % IV SOLN
300.0000 mg | Freq: Once | INTRAVENOUS | Status: AC
  Administered 2022-06-13: 300 mg via INTRAVENOUS
  Filled 2022-06-13: qty 300

## 2022-06-13 MED ORDER — ACETAMINOPHEN 325 MG PO TABS
650.0000 mg | ORAL_TABLET | Freq: Once | ORAL | Status: AC
  Administered 2022-06-13: 650 mg via ORAL
  Filled 2022-06-13: qty 2

## 2022-06-13 MED ORDER — DIPHENHYDRAMINE HCL 50 MG/ML IJ SOLN
25.0000 mg | Freq: Once | INTRAMUSCULAR | Status: AC
  Administered 2022-06-13: 25 mg via INTRAVENOUS
  Filled 2022-06-13: qty 1

## 2022-06-13 NOTE — Progress Notes (Signed)
Pt. declines to stay for 30 minute post observation. States she has tolerated treatment without any issues. Vital signs stable, pt. left via ambulation, and no respiratory distress noted.

## 2022-06-13 NOTE — Patient Instructions (Signed)

## 2022-09-08 ENCOUNTER — Other Ambulatory Visit (HOSPITAL_COMMUNITY): Payer: Self-pay

## 2022-09-11 ENCOUNTER — Other Ambulatory Visit (HOSPITAL_COMMUNITY): Payer: Self-pay

## 2022-12-05 ENCOUNTER — Other Ambulatory Visit: Payer: Self-pay

## 2022-12-05 ENCOUNTER — Encounter: Payer: Self-pay | Admitting: Hematology and Oncology

## 2022-12-05 ENCOUNTER — Other Ambulatory Visit (HOSPITAL_COMMUNITY): Payer: Self-pay

## 2023-03-06 ENCOUNTER — Other Ambulatory Visit (HOSPITAL_COMMUNITY): Payer: Self-pay

## 2023-03-07 ENCOUNTER — Other Ambulatory Visit: Payer: Self-pay

## 2023-03-07 ENCOUNTER — Other Ambulatory Visit (HOSPITAL_COMMUNITY): Payer: Self-pay

## 2023-05-09 ENCOUNTER — Telehealth: Payer: Self-pay | Admitting: Hematology and Oncology

## 2023-05-09 NOTE — Telephone Encounter (Signed)
Rescheduled appointment per [facility]. Left voicemail.

## 2023-05-21 ENCOUNTER — Telehealth: Payer: Self-pay | Admitting: Hematology and Oncology

## 2023-05-24 ENCOUNTER — Inpatient Hospital Stay: Payer: Medicare Other | Admitting: Hematology and Oncology

## 2023-06-05 ENCOUNTER — Inpatient Hospital Stay: Payer: Medicare Other | Admitting: Hematology and Oncology

## 2023-06-12 ENCOUNTER — Inpatient Hospital Stay: Payer: Medicare Other | Admitting: Hematology and Oncology

## 2023-06-12 NOTE — Progress Notes (Signed)
Patient Care Team: Jackie Plum, MD as PCP - General (Internal Medicine)  DIAGNOSIS: No diagnosis found.  SUMMARY OF ONCOLOGIC HISTORY: Oncology History   No history exists.    CHIEF COMPLIANT: Follow-up on anagrelide therapy on Essential thrombocytosis    INTERVAL HISTORY: [name] is a 87 year old with above-mentioned history of essential thrombocytosis. She presents to the clinic today for a follow-up.    ALLERGIES:  is allergic to shellfish allergy.  MEDICATIONS:  Current Outpatient Medications  Medication Sig Dispense Refill   acetaminophen (TYLENOL) 325 MG tablet Take 650 mg by mouth every 6 (six) hours as needed for moderate pain or headache.     amLODipine (NORVASC) 10 MG tablet Take 0.5 tablets (5 mg total) by mouth daily. 1 tablet 0   anagrelide (AGRYLIN) 0.5 MG capsule Take 2 capsules (1 mg total) by mouth 2 (two) times daily. 360 capsule 3   folic acid (FOLVITE) 1 MG tablet TK 1 T PO QD  5   furosemide (LASIX) 40 MG tablet Take 1 tablet (40 mg total) by mouth daily. 90 tablet 3   gabapentin (NEURONTIN) 300 MG capsule Take 300 mg by mouth daily.     levothyroxine (SYNTHROID) 100 MCG tablet Take 1 tablet (100 mcg total) by mouth daily before breakfast. 30 tablet 3   losartan (COZAAR) 50 MG tablet Take 1 tablet (50 mg total) by mouth daily. 90 tablet 3   meloxicam (MOBIC) 7.5 MG tablet Take 7.5-15 mg by mouth daily.     metFORMIN (GLUCOPHAGE) 500 MG tablet Take by mouth daily.     metoprolol succinate (TOPROL-XL) 25 MG 24 hr tablet Take 1 tablet (25 mg total) by mouth daily. (Patient not taking: Reported on 05/24/2022) 90 tablet 3   nitroGLYCERIN (NITROSTAT) 0.4 MG SL tablet Place 1 tablet (0.4 mg total) under the tongue every 5 (five) minutes as needed for chest pain. 90 tablet 3   omeprazole (PRILOSEC) 20 MG capsule TAKE ONE CAPSULE BY MOUTH EVERY DAY 90 capsule 3   polyethylene glycol (MIRALAX / GLYCOLAX) packet Take 17 g by mouth daily. 30 each 0    potassium chloride SA (KLOR-CON) 20 MEQ tablet TAKE 1 TABLET(20 MEQ) BY MOUTH DAILY WITH LASIX AND FUROSEMIDE AS NEEDED 90 tablet 3   primidone (MYSOLINE) 50 MG tablet Take 50 mg by mouth at bedtime. (Patient not taking: Reported on 05/24/2022)  3   rosuvastatin (CRESTOR) 10 MG tablet Take 1 tablet (10 mg total) by mouth daily. 90 tablet 3   senna (SENOKOT) 8.6 MG TABS tablet Take 2 tablets (17.2 mg total) by mouth daily as needed for moderate constipation. 30 each 0   vitamin B-12 (CYANOCOBALAMIN) 1000 MCG tablet Take 1,000 mcg by mouth daily.     No current facility-administered medications for this visit.    PHYSICAL EXAMINATION: ECOG PERFORMANCE STATUS: {CHL ONC ECOG PS:[id number]}  There were no vitals filed for this visit. There were no vitals filed for this visit.  BREAST:*** No palpable masses or nodules in either right or left breasts. No palpable axillary supraclavicular or infraclavicular adenopathy no breast tenderness or nipple discharge. (exam performed in the presence of a chaperone)  LABORATORY DATA:  I have reviewed the data as listed    Latest Ref Rng & Units 05/23/2022   11:00 AM 08/23/2020    8:54 AM 12/02/2018   12:55 PM  CMP  Glucose 70 - 99 mg/dL 528  413  244   BUN 8 - 23 mg/dL 30  25  21   Creatinine 0.44 - 1.00 mg/dL 1.61  0.96  0.45   Sodium 135 - 145 mmol/L 142  138  139   Potassium 3.5 - 5.1 mmol/L 3.5  3.4  3.7   Chloride 98 - 111 mmol/L 105  102  98   CO2 22 - 32 mmol/L 31  30  30    Calcium 8.9 - 10.3 mg/dL 9.5  40.9  9.9   Total Protein 6.5 - 8.1 g/dL 8.0  8.9  9.1   Total Bilirubin 0.3 - 1.2 mg/dL 0.5  0.6  0.7   Alkaline Phos 38 - 126 U/L 62  80  81   AST 15 - 41 U/L 16  23  21    ALT 0 - 44 U/L 8  17  13      Lab Results  Component Value Date   WBC 7.1 05/23/2022   HGB 9.1 (L) 05/23/2022   HCT 28.6 (L) 05/23/2022   MCV 78.1 (L) 05/23/2022   PLT 343 05/23/2022   NEUTROABS 3.9 05/23/2022    ASSESSMENT & PLAN:  No problem-specific  Assessment & Plan notes found for this encounter.    No orders of the defined types were placed in this encounter.  The patient has a good understanding of the overall plan. she agrees with it. she will call with any problems that may develop before the next visit here. Total time spent: 30 mins including face to face time and time spent for planning, charting and co-ordination of care   Sherlyn Lick, CMA 06/12/23    I Janan Ridge am acting as a Neurosurgeon for [employer]  ***

## 2023-06-14 ENCOUNTER — Inpatient Hospital Stay: Payer: Medicare Other

## 2023-06-14 ENCOUNTER — Other Ambulatory Visit: Payer: Self-pay

## 2023-06-14 ENCOUNTER — Inpatient Hospital Stay: Payer: Medicare Other | Attending: Hematology and Oncology | Admitting: Hematology and Oncology

## 2023-06-14 ENCOUNTER — Other Ambulatory Visit (HOSPITAL_COMMUNITY): Payer: Self-pay

## 2023-06-14 VITALS — BP 112/92 | HR 86 | Temp 97.3°F | Resp 18 | Ht 61.0 in | Wt 133.7 lb

## 2023-06-14 DIAGNOSIS — D75839 Thrombocytosis, unspecified: Secondary | ICD-10-CM | POA: Diagnosis not present

## 2023-06-14 DIAGNOSIS — D509 Iron deficiency anemia, unspecified: Secondary | ICD-10-CM | POA: Insufficient documentation

## 2023-06-14 DIAGNOSIS — D473 Essential (hemorrhagic) thrombocythemia: Secondary | ICD-10-CM | POA: Insufficient documentation

## 2023-06-14 DIAGNOSIS — R0602 Shortness of breath: Secondary | ICD-10-CM | POA: Diagnosis not present

## 2023-06-14 LAB — CMP (CANCER CENTER ONLY)
ALT: 12 U/L (ref 0–44)
AST: 19 U/L (ref 15–41)
Albumin: 3.8 g/dL (ref 3.5–5.0)
Alkaline Phosphatase: 63 U/L (ref 38–126)
Anion gap: 8 (ref 5–15)
BUN: 24 mg/dL — ABNORMAL HIGH (ref 8–23)
CO2: 29 mmol/L (ref 22–32)
Calcium: 9.2 mg/dL (ref 8.9–10.3)
Chloride: 105 mmol/L (ref 98–111)
Creatinine: 1.02 mg/dL — ABNORMAL HIGH (ref 0.44–1.00)
GFR, Estimated: 54 mL/min — ABNORMAL LOW (ref >60)
Glucose, Bld: 94 mg/dL (ref 70–99)
Potassium: 3.9 mmol/L (ref 3.5–5.1)
Sodium: 142 mmol/L (ref 135–145)
Total Bilirubin: 0.4 mg/dL (ref 0.3–1.2)
Total Protein: 7.6 g/dL (ref 6.5–8.1)

## 2023-06-14 LAB — CBC WITH DIFFERENTIAL (CANCER CENTER ONLY)
Abs Immature Granulocytes: 0.05 10*3/uL (ref 0.00–0.07)
Basophils Absolute: 0.1 10*3/uL (ref 0.0–0.1)
Basophils Relative: 1 %
Eosinophils Absolute: 0.5 10*3/uL (ref 0.0–0.5)
Eosinophils Relative: 6 %
HCT: 29.6 % — ABNORMAL LOW (ref 36.0–46.0)
Hemoglobin: 9.7 g/dL — ABNORMAL LOW (ref 12.0–15.0)
Immature Granulocytes: 1 %
Lymphocytes Relative: 24 %
Lymphs Abs: 2 10*3/uL (ref 0.7–4.0)
MCH: 26.7 pg (ref 26.0–34.0)
MCHC: 32.8 g/dL (ref 30.0–36.0)
MCV: 81.5 fL (ref 80.0–100.0)
Monocytes Absolute: 0.9 10*3/uL (ref 0.1–1.0)
Monocytes Relative: 11 %
Neutro Abs: 4.7 10*3/uL (ref 1.7–7.7)
Neutrophils Relative %: 57 %
Platelet Count: 521 10*3/uL — ABNORMAL HIGH (ref 150–400)
RBC: 3.63 MIL/uL — ABNORMAL LOW (ref 3.87–5.11)
RDW: 17.1 % — ABNORMAL HIGH (ref 11.5–15.5)
WBC Count: 8.2 10*3/uL (ref 4.0–10.5)
nRBC: 0 % (ref 0.0–0.2)

## 2023-06-14 MED ORDER — ANAGRELIDE HCL 0.5 MG PO CAPS
1.0000 mg | ORAL_CAPSULE | Freq: Two times a day (BID) | ORAL | 3 refills | Status: DC
  Filled 2023-06-14: qty 360, 90d supply, fill #0
  Filled 2023-09-12: qty 360, 90d supply, fill #1
  Filled 2023-12-16: qty 360, 90d supply, fill #2
  Filled 2024-03-16: qty 360, 90d supply, fill #3

## 2023-06-14 NOTE — Assessment & Plan Note (Signed)
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 She will do labs in [place] and will reach out to [place] if the platelet count increases.   Return to clinic in 1 year for follow-up

## 2023-06-19 DIAGNOSIS — E1165 Type 2 diabetes mellitus with hyperglycemia: Secondary | ICD-10-CM | POA: Diagnosis not present

## 2023-06-19 DIAGNOSIS — E1340 Other specified diabetes mellitus with diabetic neuropathy, unspecified: Secondary | ICD-10-CM | POA: Diagnosis not present

## 2023-06-19 DIAGNOSIS — K219 Gastro-esophageal reflux disease without esophagitis: Secondary | ICD-10-CM | POA: Diagnosis not present

## 2023-06-19 DIAGNOSIS — I10 Essential (primary) hypertension: Secondary | ICD-10-CM | POA: Diagnosis not present

## 2023-06-19 DIAGNOSIS — E782 Mixed hyperlipidemia: Secondary | ICD-10-CM | POA: Diagnosis not present

## 2023-06-19 DIAGNOSIS — E669 Obesity, unspecified: Secondary | ICD-10-CM | POA: Diagnosis not present

## 2023-06-19 DIAGNOSIS — E039 Hypothyroidism, unspecified: Secondary | ICD-10-CM | POA: Diagnosis not present

## 2023-06-19 DIAGNOSIS — N1831 Chronic kidney disease, stage 3a: Secondary | ICD-10-CM | POA: Diagnosis not present

## 2023-06-19 DIAGNOSIS — E7211 Homocystinuria: Secondary | ICD-10-CM | POA: Diagnosis not present

## 2023-06-21 NOTE — Progress Notes (Signed)
Subjective:   [name], female    DOB: 1936-01-20, 87 y.o.   MRN: [id number]   Chief complaint:  Leg edema Dyspnea on exertion   87 y.o. African American female with hypertension, type 2 DM, hyperlipidemia, essential thrombocytosis, leg edema  Patient is here with her daughter.  As every summer, patient is visiting from [place] to spend 3-4 months with her daughter here.  She has no new complaints.  She is still ambulatory, cooks for herself.  She is ran out of Lasix.  Blood pressure slightly elevated today, generally better than this, including a week ago.    Current Outpatient Medications:    acetaminophen (TYLENOL) 325 MG tablet, Take 650 mg by mouth every 6 (six) hours as needed for moderate pain or headache., Disp: , Rfl:    amLODipine (NORVASC) 10 MG tablet, Take 0.5 tablets (5 mg total) by mouth daily., Disp: 1 tablet, Rfl: 0   anagrelide (AGRYLIN) 0.5 MG capsule, Take 2 capsules (1 mg) by mouth 2 times daily., Disp: 360 capsule, Rfl: 3   folic acid (FOLVITE) 1 MG tablet, TK 1 T PO QD, Disp: , Rfl: 5   furosemide (LASIX) 40 MG tablet, Take 1 tablet (40 mg total) by mouth daily., Disp: 90 tablet, Rfl: 3   gabapentin (NEURONTIN) 300 MG capsule, Take 300 mg by mouth daily., Disp: , Rfl:    levothyroxine (SYNTHROID) 100 MCG tablet, Take 1 tablet (100 mcg total) by mouth daily before breakfast., Disp: 30 tablet, Rfl: 3   losartan (COZAAR) 50 MG tablet, Take 1 tablet (50 mg total) by mouth daily., Disp: 90 tablet, Rfl: 3   meloxicam (MOBIC) 7.5 MG tablet, Take 7.5-15 mg by mouth daily., Disp: , Rfl:    metFORMIN (GLUCOPHAGE) 500 MG tablet, Take by mouth daily., Disp: , Rfl:    metoprolol succinate (TOPROL-XL) 25 MG 24 hr tablet, Take 1 tablet (25 mg total) by mouth daily. (Patient not taking: Reported on 05/24/2022), Disp: 90 tablet, Rfl: 3   nitroGLYCERIN (NITROSTAT) 0.4 MG SL tablet, Place 1 tablet (0.4 mg total) under the tongue every 5 (five) minutes as needed for chest pain.,  Disp: 90 tablet, Rfl: 3   omeprazole (PRILOSEC) 20 MG capsule, TAKE ONE CAPSULE BY MOUTH EVERY DAY, Disp: 90 capsule, Rfl: 3   polyethylene glycol (MIRALAX / GLYCOLAX) packet, Take 17 g by mouth daily., Disp: 30 each, Rfl: 0   potassium chloride SA (KLOR-CON) 20 MEQ tablet, TAKE 1 TABLET(20 MEQ) BY MOUTH DAILY WITH LASIX AND FUROSEMIDE AS NEEDED, Disp: 90 tablet, Rfl: 3   primidone (MYSOLINE) 50 MG tablet, Take 50 mg by mouth at bedtime. (Patient not taking: Reported on 05/24/2022), Disp: , Rfl: 3   rosuvastatin (CRESTOR) 10 MG tablet, Take 1 tablet (10 mg total) by mouth daily., Disp: 90 tablet, Rfl: 3   senna (SENOKOT) 8.6 MG TABS tablet, Take 2 tablets (17.2 mg total) by mouth daily as needed for moderate constipation., Disp: 30 each, Rfl: 0   vitamin B-12 (CYANOCOBALAMIN) 1000 MCG tablet, Take 1,000 mcg by mouth daily., Disp: , Rfl:   Cardiovascular studies:  EKG 06/22/2023: Sinus rhythm 96 bpm Occasional PAC   Diffuse nonspecific T-abnormality  Echocardiogram 05/26/2022:  Left ventricle cavity is normal in size and wall thickness. Normal global  wall motion. Normal LV systolic function with EF 62%. Doppler evidence of  grade I (impaired) diastolic dysfunction, normal LAP.  Left atrial cavity is mildly dilated.  Trileaflet aortic valve. Mild aortic valve leaflet calcification.  Mild  (Grade I) aortic regurgitation.  Mild (Grade I) mitral regurgitation.  Moderate tricuspid regurgitation. Estimated pulmonary artery systolic  pressure 30 mmHg.  Mild pulmonic regurgitation.  Small pericardial effusion.  Previous study in 2021 showed moderate LA dilatation, estimated PASP 39  mmHg, did not show pericardial effusion.   EKG 07/29/2020: Sinus rhythm 69 bpm  Borderline LVH Nonspecific T-abnormality  Recent labs: 05/23/2022: Glucose 112, BUN/Cr 30/1.15. EGFR 47. Na/K 142/3.5. Rest of the CMP normal H/H 9/28. MCV 78. Platelets 343  11/26/2018: Glucose 108. BUN/Cr 2/1.04. eGFR 8.  Na/K  141/5.0. Rest of the CMP normal H/H 11.5/36.1. MCV 82. Platelets 1033 Chol 205, TG 213, HDL 54, LDL 108  Review of Systems  Cardiovascular:  Positive for dyspnea on exertion (Mild, stable) and leg swelling (Occasional). Negative for chest pain, palpitations and syncope.  Respiratory:  Negative for cough.          Vitals:   06/22/23 1020  BP: (!) 144/72  Pulse: 78  Resp: 16  SpO2: 96%       Objective:    Physical Exam Vitals and nursing note reviewed.  Constitutional:      General: She is not in acute distress. Neck:     Vascular: No JVD.  Cardiovascular:     Rate and Rhythm: Normal rate and regular rhythm.     Heart sounds: Normal heart sounds. No murmur heard. Pulmonary:     Effort: Pulmonary effort is normal.     Breath sounds: Normal breath sounds. No wheezing or rales.  Musculoskeletal:     Right lower leg: No edema.     Left lower leg: No edema.     Visit diagnoses:    ICD-10-CM   1. Essential hypertension, benign  I10 EKG 12-Lead    furosemide (LASIX) 20 MG tablet    DISCONTINUED: furosemide (LASIX) 20 MG tablet    2. Leg edema  R60.0 furosemide (LASIX) 20 MG tablet    DISCONTINUED: furosemide (LASIX) 20 MG tablet          Assessment & Recommendations:   87 y.o. African American female with hypertension, type 2 DM, hyperlipidemia, essential thrombocytosis, leg edema   Hypertension: Reasonably well-controlled.  Continue metoprolol, amlodipine, losartan.  Refilled her Lasix at 20 mg as needed.    F/u in 1 year    Elder Negus, MD Pager: [phone] Office: [phone]

## 2023-06-22 ENCOUNTER — Encounter: Payer: Self-pay | Admitting: Cardiology

## 2023-06-22 ENCOUNTER — Ambulatory Visit: Payer: Medicare Other | Admitting: Cardiology

## 2023-06-22 ENCOUNTER — Other Ambulatory Visit (HOSPITAL_COMMUNITY): Payer: Self-pay

## 2023-06-22 ENCOUNTER — Encounter: Payer: Self-pay | Admitting: Hematology and Oncology

## 2023-06-22 VITALS — BP 144/72 | HR 78 | Resp 16 | Ht 61.0 in | Wt 131.4 lb

## 2023-06-22 DIAGNOSIS — I10 Essential (primary) hypertension: Secondary | ICD-10-CM | POA: Diagnosis not present

## 2023-06-22 DIAGNOSIS — R600 Localized edema: Secondary | ICD-10-CM | POA: Diagnosis not present

## 2023-06-22 MED ORDER — FUROSEMIDE 20 MG PO TABS
20.0000 mg | ORAL_TABLET | ORAL | 3 refills | Status: AC | PRN
Start: 2023-06-22 — End: ?

## 2023-06-22 MED ORDER — FUROSEMIDE 20 MG PO TABS
20.0000 mg | ORAL_TABLET | ORAL | 3 refills | Status: DC | PRN
  Filled 2023-06-22: qty 90, fill #0

## 2023-07-11 ENCOUNTER — Encounter: Payer: Self-pay | Admitting: Hematology and Oncology

## 2023-07-16 ENCOUNTER — Inpatient Hospital Stay: Payer: Medicare Other | Attending: Hematology and Oncology

## 2023-07-16 ENCOUNTER — Encounter: Payer: Self-pay | Admitting: Hematology and Oncology

## 2023-07-16 VITALS — BP 138/71 | HR 75 | Temp 98.3°F | Resp 16

## 2023-07-16 DIAGNOSIS — D473 Essential (hemorrhagic) thrombocythemia: Secondary | ICD-10-CM | POA: Diagnosis not present

## 2023-07-16 DIAGNOSIS — D509 Iron deficiency anemia, unspecified: Secondary | ICD-10-CM | POA: Diagnosis not present

## 2023-07-16 DIAGNOSIS — D500 Iron deficiency anemia secondary to blood loss (chronic): Secondary | ICD-10-CM

## 2023-07-16 MED ORDER — SODIUM CHLORIDE 0.9 % IV SOLN
300.0000 mg | Freq: Once | INTRAVENOUS | Status: AC
  Administered 2023-07-16: 300 mg via INTRAVENOUS
  Filled 2023-07-16: qty 300

## 2023-07-16 MED ORDER — DIPHENHYDRAMINE HCL 50 MG/ML IJ SOLN
25.0000 mg | Freq: Once | INTRAMUSCULAR | Status: AC
  Administered 2023-07-16: 25 mg via INTRAVENOUS
  Filled 2023-07-16: qty 1

## 2023-07-16 MED ORDER — SODIUM CHLORIDE 0.9 % IV SOLN: Freq: Once | INTRAVENOUS | Status: AC

## 2023-07-16 MED ORDER — ACETAMINOPHEN 325 MG PO TABS
650.0000 mg | ORAL_TABLET | Freq: Once | ORAL | Status: AC
  Administered 2023-07-16: 650 mg via ORAL
  Filled 2023-07-16: qty 2

## 2023-07-16 NOTE — Patient Instructions (Signed)
Iron Sucrose Injection What is this medication? IRON SUCROSE (EYE ern SOO krose) treats low levels of iron (iron deficiency anemia) in people with kidney disease. Iron is a mineral that plays an important role in making red blood cells, which carry oxygen from your lungs to the rest of your body. This medicine may be used for other purposes; ask your health care provider or pharmacist if you have questions. COMMON BRAND NAME(S): Venofer What should I tell my care team before I take this medication? They need to know if you have any of these conditions: Anemia not caused by low iron levels Heart disease High levels of iron in the blood Kidney disease Liver disease An unusual or allergic reaction to iron, other medications, foods, dyes, or preservatives Pregnant or trying to get pregnant Breastfeeding How should I use this medication? This medication is for infusion into a vein. It is given in a hospital or clinic setting. Talk to your care team about the use of this medication in children. While this medication may be prescribed for children as young as 2 years for selected conditions, precautions do apply. Overdosage: If you think you have taken too much of this medicine contact a poison control center or emergency room at once. NOTE: This medicine is only for you. Do not share this medicine with others. What if I miss a dose? Keep appointments for follow-up doses. It is important not to miss your dose. Call your care team if you are unable to keep an appointment. What may interact with this medication? Do not take this medication with any of the following: Deferoxamine Dimercaprol Other iron products This medication may also interact with the following: Chloramphenicol Deferasirox This list may not describe all possible interactions. Give your health care provider a list of all the medicines, herbs, non-prescription drugs, or dietary supplements you use. Also tell them if you smoke,  drink alcohol, or use illegal drugs. Some items may interact with your medicine. What should I watch for while using this medication? Visit your care team regularly. Tell your care team if your symptoms do not start to get better or if they get worse. You may need blood work done while you are taking this medication. You may need to follow a special diet. Talk to your care team. Foods that contain iron include: whole grains/cereals, dried fruits, beans, or peas, leafy green vegetables, and organ meats (liver, kidney). What side effects may I notice from receiving this medication? Side effects that you should report to your care team as soon as possible: Allergic reactions--skin rash, itching, hives, swelling of the face, lips, tongue, or throat Low blood pressure--dizziness, feeling faint or lightheaded, blurry vision Shortness of breath Side effects that usually do not require medical attention (report to your care team if they continue or are bothersome): Flushing Headache Joint pain Muscle pain Nausea Pain, redness, or irritation at injection site This list may not describe all possible side effects. Call your doctor for medical advice about side effects. You may report side effects to FDA at 1-800-FDA-1088. Where should I keep my medication? This medication is given in a hospital or clinic. It will not be stored at home. NOTE: This sheet is a summary. It may not cover all possible information. If you have questions about this medicine, talk to your doctor, pharmacist, or health care provider.  2024 Elsevier/Gold Standard (2023-03-30 00:00:00)

## 2023-07-16 NOTE — Progress Notes (Signed)
Pt declined to stay for 30 min post obs, discharged with VSS, ambulatory to lobby ?

## 2023-07-17 ENCOUNTER — Encounter: Payer: Self-pay | Admitting: Hematology and Oncology

## 2023-07-23 ENCOUNTER — Inpatient Hospital Stay: Payer: Medicare Other

## 2023-07-23 VITALS — BP 129/72 | HR 71 | Temp 98.0°F | Resp 14

## 2023-07-23 DIAGNOSIS — D473 Essential (hemorrhagic) thrombocythemia: Secondary | ICD-10-CM | POA: Diagnosis not present

## 2023-07-23 DIAGNOSIS — D500 Iron deficiency anemia secondary to blood loss (chronic): Secondary | ICD-10-CM

## 2023-07-23 DIAGNOSIS — D509 Iron deficiency anemia, unspecified: Secondary | ICD-10-CM | POA: Diagnosis not present

## 2023-07-23 MED ORDER — ACETAMINOPHEN 325 MG PO TABS
650.0000 mg | ORAL_TABLET | Freq: Once | ORAL | Status: AC
  Administered 2023-07-23: 650 mg via ORAL
  Filled 2023-07-23: qty 2

## 2023-07-23 MED ORDER — SODIUM CHLORIDE 0.9 % IV SOLN
300.0000 mg | Freq: Once | INTRAVENOUS | Status: AC
  Administered 2023-07-23: 300 mg via INTRAVENOUS
  Filled 2023-07-23: qty 300

## 2023-07-23 MED ORDER — DIPHENHYDRAMINE HCL 50 MG/ML IJ SOLN
25.0000 mg | Freq: Once | INTRAMUSCULAR | Status: AC
  Administered 2023-07-23: 25 mg via INTRAVENOUS
  Filled 2023-07-23: qty 1

## 2023-07-23 MED ORDER — SODIUM CHLORIDE 0.9 % IV SOLN: Freq: Once | INTRAVENOUS | Status: AC

## 2023-07-23 NOTE — Progress Notes (Signed)
Patient declined 30 min post iron observation.  Tolerated treatment well without incident.  VSS at discharge.  Ambulatory to lobby.

## 2023-07-23 NOTE — Patient Instructions (Signed)
Iron Sucrose Injection What is this medication? IRON SUCROSE (EYE ern SOO krose) treats low levels of iron (iron deficiency anemia) in people with kidney disease. Iron is a mineral that plays an important role in making red blood cells, which carry oxygen from your lungs to the rest of your body. This medicine may be used for other purposes; ask your health care provider or pharmacist if you have questions. COMMON BRAND NAME(S): Venofer What should I tell my care team before I take this medication? They need to know if you have any of these conditions: Anemia not caused by low iron levels Heart disease High levels of iron in the blood Kidney disease Liver disease An unusual or allergic reaction to iron, other medications, foods, dyes, or preservatives Pregnant or trying to get pregnant Breastfeeding How should I use this medication? This medication is for infusion into a vein. It is given in a hospital or clinic setting. Talk to your care team about the use of this medication in children. While this medication may be prescribed for children as young as 2 years for selected conditions, precautions do apply. Overdosage: If you think you have taken too much of this medicine contact a poison control center or emergency room at once. NOTE: This medicine is only for you. Do not share this medicine with others. What if I miss a dose? Keep appointments for follow-up doses. It is important not to miss your dose. Call your care team if you are unable to keep an appointment. What may interact with this medication? Do not take this medication with any of the following: Deferoxamine Dimercaprol Other iron products This medication may also interact with the following: Chloramphenicol Deferasirox This list may not describe all possible interactions. Give your health care provider a list of all the medicines, herbs, non-prescription drugs, or dietary supplements you use. Also tell them if you smoke,  drink alcohol, or use illegal drugs. Some items may interact with your medicine. What should I watch for while using this medication? Visit your care team regularly. Tell your care team if your symptoms do not start to get better or if they get worse. You may need blood work done while you are taking this medication. You may need to follow a special diet. Talk to your care team. Foods that contain iron include: whole grains/cereals, dried fruits, beans, or peas, leafy green vegetables, and organ meats (liver, kidney). What side effects may I notice from receiving this medication? Side effects that you should report to your care team as soon as possible: Allergic reactions--skin rash, itching, hives, swelling of the face, lips, tongue, or throat Low blood pressure--dizziness, feeling faint or lightheaded, blurry vision Shortness of breath Side effects that usually do not require medical attention (report to your care team if they continue or are bothersome): Flushing Headache Joint pain Muscle pain Nausea Pain, redness, or irritation at injection site This list may not describe all possible side effects. Call your doctor for medical advice about side effects. You may report side effects to FDA at 1-800-FDA-1088. Where should I keep my medication? This medication is given in a hospital or clinic. It will not be stored at home. NOTE: This sheet is a summary. It may not cover all possible information. If you have questions about this medicine, talk to your doctor, pharmacist, or health care provider.  2024 Elsevier/Gold Standard (2023-03-30 00:00:00)

## 2023-07-23 NOTE — Progress Notes (Signed)
Patient states she does not stay for 30 minute post iron observation.

## 2023-07-30 ENCOUNTER — Inpatient Hospital Stay: Payer: Medicare Other

## 2023-07-30 VITALS — BP 141/84 | HR 55 | Temp 98.6°F | Resp 16

## 2023-07-30 DIAGNOSIS — D473 Essential (hemorrhagic) thrombocythemia: Secondary | ICD-10-CM | POA: Diagnosis not present

## 2023-07-30 DIAGNOSIS — D500 Iron deficiency anemia secondary to blood loss (chronic): Secondary | ICD-10-CM

## 2023-07-30 DIAGNOSIS — D509 Iron deficiency anemia, unspecified: Secondary | ICD-10-CM | POA: Diagnosis not present

## 2023-07-30 MED ORDER — SODIUM CHLORIDE 0.9 % IV SOLN: Freq: Once | INTRAVENOUS | Status: AC

## 2023-07-30 MED ORDER — ACETAMINOPHEN 325 MG PO TABS
650.0000 mg | ORAL_TABLET | Freq: Once | ORAL | Status: AC
  Administered 2023-07-30: 650 mg via ORAL
  Filled 2023-07-30: qty 2

## 2023-07-30 MED ORDER — SODIUM CHLORIDE 0.9 % IV SOLN
300.0000 mg | Freq: Once | INTRAVENOUS | Status: AC
  Administered 2023-07-30: 300 mg via INTRAVENOUS
  Filled 2023-07-30: qty 300

## 2023-07-30 MED ORDER — DIPHENHYDRAMINE HCL 50 MG/ML IJ SOLN
25.0000 mg | Freq: Once | INTRAMUSCULAR | Status: AC
  Administered 2023-07-30: 25 mg via INTRAVENOUS
  Filled 2023-07-30: qty 1

## 2023-07-30 NOTE — Patient Instructions (Signed)
Iron Sucrose Injection What is this medication? IRON SUCROSE (EYE ern SOO krose) treats low levels of iron (iron deficiency anemia) in people with kidney disease. Iron is a mineral that plays an important role in making red blood cells, which carry oxygen from your lungs to the rest of your body. This medicine may be used for other purposes; ask your health care provider or pharmacist if you have questions. COMMON BRAND NAME(S): Venofer What should I tell my care team before I take this medication? They need to know if you have any of these conditions: Anemia not caused by low iron levels Heart disease High levels of iron in the blood Kidney disease Liver disease An unusual or allergic reaction to iron, other medications, foods, dyes, or preservatives Pregnant or trying to get pregnant Breastfeeding How should I use this medication? This medication is for infusion into a vein. It is given in a hospital or clinic setting. Talk to your care team about the use of this medication in children. While this medication may be prescribed for children as young as 2 years for selected conditions, precautions do apply. Overdosage: If you think you have taken too much of this medicine contact a poison control center or emergency room at once. NOTE: This medicine is only for you. Do not share this medicine with others. What if I miss a dose? Keep appointments for follow-up doses. It is important not to miss your dose. Call your care team if you are unable to keep an appointment. What may interact with this medication? Do not take this medication with any of the following: Deferoxamine Dimercaprol Other iron products This medication may also interact with the following: Chloramphenicol Deferasirox This list may not describe all possible interactions. Give your health care provider a list of all the medicines, herbs, non-prescription drugs, or dietary supplements you use. Also tell them if you smoke,  drink alcohol, or use illegal drugs. Some items may interact with your medicine. What should I watch for while using this medication? Visit your care team regularly. Tell your care team if your symptoms do not start to get better or if they get worse. You may need blood work done while you are taking this medication. You may need to follow a special diet. Talk to your care team. Foods that contain iron include: whole grains/cereals, dried fruits, beans, or peas, leafy green vegetables, and organ meats (liver, kidney). What side effects may I notice from receiving this medication? Side effects that you should report to your care team as soon as possible: Allergic reactions--skin rash, itching, hives, swelling of the face, lips, tongue, or throat Low blood pressure--dizziness, feeling faint or lightheaded, blurry vision Shortness of breath Side effects that usually do not require medical attention (report to your care team if they continue or are bothersome): Flushing Headache Joint pain Muscle pain Nausea Pain, redness, or irritation at injection site This list may not describe all possible side effects. Call your doctor for medical advice about side effects. You may report side effects to FDA at 1-800-FDA-1088. Where should I keep my medication? This medication is given in a hospital or clinic. It will not be stored at home. NOTE: This sheet is a summary. It may not cover all possible information. If you have questions about this medicine, talk to your doctor, pharmacist, or health care provider.  2024 Elsevier/Gold Standard (2023-03-30 00:00:00)

## 2023-08-02 DIAGNOSIS — I119 Hypertensive heart disease without heart failure: Secondary | ICD-10-CM | POA: Diagnosis not present

## 2023-08-02 DIAGNOSIS — K219 Gastro-esophageal reflux disease without esophagitis: Secondary | ICD-10-CM | POA: Diagnosis not present

## 2023-08-02 DIAGNOSIS — H9313 Tinnitus, bilateral: Secondary | ICD-10-CM | POA: Diagnosis not present

## 2023-08-02 DIAGNOSIS — H1013 Acute atopic conjunctivitis, bilateral: Secondary | ICD-10-CM | POA: Diagnosis not present

## 2023-08-02 DIAGNOSIS — N1831 Chronic kidney disease, stage 3a: Secondary | ICD-10-CM | POA: Diagnosis not present

## 2023-08-02 DIAGNOSIS — Z0001 Encounter for general adult medical examination with abnormal findings: Secondary | ICD-10-CM | POA: Diagnosis not present

## 2023-08-02 DIAGNOSIS — E1142 Type 2 diabetes mellitus with diabetic polyneuropathy: Secondary | ICD-10-CM | POA: Diagnosis not present

## 2023-08-02 DIAGNOSIS — G250 Essential tremor: Secondary | ICD-10-CM | POA: Diagnosis not present

## 2023-08-02 DIAGNOSIS — E782 Mixed hyperlipidemia: Secondary | ICD-10-CM | POA: Diagnosis not present

## 2023-08-02 DIAGNOSIS — E1122 Type 2 diabetes mellitus with diabetic chronic kidney disease: Secondary | ICD-10-CM | POA: Diagnosis not present

## 2023-08-02 DIAGNOSIS — E039 Hypothyroidism, unspecified: Secondary | ICD-10-CM | POA: Diagnosis not present

## 2023-08-02 DIAGNOSIS — E7211 Homocystinuria: Secondary | ICD-10-CM | POA: Diagnosis not present

## 2023-09-04 ENCOUNTER — Inpatient Hospital Stay: Payer: Medicare Other | Attending: Hematology and Oncology

## 2023-09-04 ENCOUNTER — Inpatient Hospital Stay (HOSPITAL_BASED_OUTPATIENT_CLINIC_OR_DEPARTMENT_OTHER): Payer: Medicare Other | Admitting: Hematology and Oncology

## 2023-09-04 VITALS — BP 99/58 | HR 84 | Temp 97.5°F | Resp 18 | Ht 61.0 in | Wt 137.5 lb

## 2023-09-04 DIAGNOSIS — D473 Essential (hemorrhagic) thrombocythemia: Secondary | ICD-10-CM

## 2023-09-04 DIAGNOSIS — D75839 Thrombocytosis, unspecified: Secondary | ICD-10-CM | POA: Diagnosis not present

## 2023-09-04 DIAGNOSIS — D509 Iron deficiency anemia, unspecified: Secondary | ICD-10-CM | POA: Insufficient documentation

## 2023-09-04 LAB — CBC WITH DIFFERENTIAL (CANCER CENTER ONLY)
Abs Immature Granulocytes: 0.04 10*3/uL (ref 0.00–0.07)
Basophils Absolute: 0.1 10*3/uL (ref 0.0–0.1)
Basophils Relative: 1 %
Eosinophils Absolute: 0.5 10*3/uL (ref 0.0–0.5)
Eosinophils Relative: 7 %
HCT: 32.1 % — ABNORMAL LOW (ref 36.0–46.0)
Hemoglobin: 10.1 g/dL — ABNORMAL LOW (ref 12.0–15.0)
Immature Granulocytes: 1 %
Lymphocytes Relative: 23 %
Lymphs Abs: 1.7 10*3/uL (ref 0.7–4.0)
MCH: 25.7 pg — ABNORMAL LOW (ref 26.0–34.0)
MCHC: 31.5 g/dL (ref 30.0–36.0)
MCV: 81.7 fL (ref 80.0–100.0)
Monocytes Absolute: 0.9 10*3/uL (ref 0.1–1.0)
Monocytes Relative: 12 %
Neutro Abs: 4.2 10*3/uL (ref 1.7–7.7)
Neutrophils Relative %: 56 %
Platelet Count: 394 10*3/uL (ref 150–400)
RBC: 3.93 MIL/uL (ref 3.87–5.11)
RDW: 18.5 % — ABNORMAL HIGH (ref 11.5–15.5)
WBC Count: 7.4 10*3/uL (ref 4.0–10.5)
nRBC: 0 % (ref 0.0–0.2)

## 2023-09-04 LAB — CMP (CANCER CENTER ONLY)
ALT: 11 U/L (ref 0–44)
AST: 17 U/L (ref 15–41)
Albumin: 3.7 g/dL (ref 3.5–5.0)
Alkaline Phosphatase: 65 U/L (ref 38–126)
Anion gap: 7 (ref 5–15)
BUN: 23 mg/dL (ref 8–23)
CO2: 29 mmol/L (ref 22–32)
Calcium: 9.3 mg/dL (ref 8.9–10.3)
Chloride: 104 mmol/L (ref 98–111)
Creatinine: 1.39 mg/dL — ABNORMAL HIGH (ref 0.44–1.00)
GFR, Estimated: 37 mL/min — ABNORMAL LOW (ref >60)
Glucose, Bld: 140 mg/dL — ABNORMAL HIGH (ref 70–99)
Potassium: 3.8 mmol/L (ref 3.5–5.1)
Sodium: 140 mmol/L (ref 135–145)
Total Bilirubin: 0.5 mg/dL (ref 0.3–1.2)
Total Protein: 7.3 g/dL (ref 6.5–8.1)

## 2023-09-04 LAB — FERRITIN: Ferritin: 259 ng/mL (ref 11–307)

## 2023-09-04 LAB — IRON AND IRON BINDING CAPACITY (CC-WL,HP ONLY)
Iron: 52 ug/dL (ref 28–170)
Saturation Ratios: 23 % (ref 10.4–31.8)
TIBC: 224 ug/dL — ABNORMAL LOW (ref 250–450)
UIBC: 172 ug/dL (ref 148–442)

## 2023-09-04 NOTE — Assessment & Plan Note (Signed)
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

## 2023-09-04 NOTE — Progress Notes (Signed)
Patient Care Team: Jackie Plum, MD as PCP - General (Internal Medicine)  DIAGNOSIS:  Encounter Diagnosis  Name Primary?   Essential thrombocytosis (HCC) Yes      CHIEF COMPLIANT: Follow-up of anemia and essential thrombocytosis    History of Present Illness   The patient, with a history of high platelet count and anemia, has been managing her condition with anagrelide, taken twice daily. The patient's platelet count has been fluctuating over the past few months, with a recent increase noted. The patient also has anemia, with a hemoglobin level of 10 instead of the normal 12. Despite previous iron supplement treatment, the patient's hemoglobin levels remain low. The patient did not notice any significant improvement in energy levels while on the iron supplements.         ALLERGIES:  is allergic to shellfish allergy.  MEDICATIONS:  Current Outpatient Medications  Medication Sig Dispense Refill   acetaminophen (TYLENOL) 325 MG tablet Take 650 mg by mouth every 6 (six) hours as needed for moderate pain or headache.     amLODipine (NORVASC) 10 MG tablet Take 0.5 tablets (5 mg total) by mouth daily. 1 tablet 0   anagrelide (AGRYLIN) 0.5 MG capsule Take 2 capsules (1 mg) by mouth 2 times daily. 360 capsule 3   folic acid (FOLVITE) 1 MG tablet TK 1 T PO QD  5   furosemide (LASIX) 20 MG tablet Take 1 tablet (20 mg total) by mouth as needed. 90 tablet 3   gabapentin (NEURONTIN) 300 MG capsule Take 300 mg by mouth daily.     levothyroxine (SYNTHROID) 100 MCG tablet Take 1 tablet (100 mcg total) by mouth daily before breakfast. 30 tablet 3   losartan (COZAAR) 50 MG tablet Take 1 tablet (50 mg total) by mouth daily. 90 tablet 3   meloxicam (MOBIC) 7.5 MG tablet Take 7.5-15 mg by mouth daily.     metFORMIN (GLUCOPHAGE) 500 MG tablet Take by mouth daily.     metoprolol succinate (TOPROL-XL) 25 MG 24 hr tablet Take 1 tablet (25 mg total) by mouth daily. (Patient not taking: Reported on  05/24/2022) 90 tablet 3   nitroGLYCERIN (NITROSTAT) 0.4 MG SL tablet Place 1 tablet (0.4 mg total) under the tongue every 5 (five) minutes as needed for chest pain. 90 tablet 3   omeprazole (PRILOSEC) 20 MG capsule TAKE ONE CAPSULE BY MOUTH EVERY DAY 90 capsule 3   polyethylene glycol (MIRALAX / GLYCOLAX) packet Take 17 g by mouth daily. 30 each 0   potassium chloride SA (KLOR-CON) 20 MEQ tablet TAKE 1 TABLET(20 MEQ) BY MOUTH DAILY WITH LASIX AND FUROSEMIDE AS NEEDED 90 tablet 3   primidone (MYSOLINE) 50 MG tablet Take 50 mg by mouth at bedtime. (Patient not taking: Reported on 05/24/2022)  3   rosuvastatin (CRESTOR) 10 MG tablet Take 1 tablet (10 mg total) by mouth daily. 90 tablet 3   senna (SENOKOT) 8.6 MG TABS tablet Take 2 tablets (17.2 mg total) by mouth daily as needed for moderate constipation. 30 each 0   vitamin B-12 (CYANOCOBALAMIN) 1000 MCG tablet Take 1,000 mcg by mouth daily.     No current facility-administered medications for this visit.    PHYSICAL EXAMINATION: ECOG PERFORMANCE STATUS: 1 - Symptomatic but completely ambulatory  Vitals:   09/04/23 0918  BP: (!) 99/58  Pulse: 84  Resp: 18  Temp: (!) 97.5 F (36.4 C)  SpO2: 98%   Filed Weights   09/04/23 0918  Weight: 137 lb 8 oz (  62.4 kg)      LABORATORY DATA:  I have reviewed the data as listed    Latest Ref Rng & Units 09/04/2023    8:40 AM 06/14/2023    9:02 AM 05/23/2022   11:00 AM  CMP  Glucose 70 - 99 mg/dL 621  94  308   BUN 8 - 23 mg/dL 23  24  30    Creatinine 0.44 - 1.00 mg/dL 6.57  8.46  9.62   Sodium 135 - 145 mmol/L 140  142  142   Potassium 3.5 - 5.1 mmol/L 3.8  3.9  3.5   Chloride 98 - 111 mmol/L 104  105  105   CO2 22 - 32 mmol/L 29  29  31    Calcium 8.9 - 10.3 mg/dL 9.3  9.2  9.5   Total Protein 6.5 - 8.1 g/dL 7.3  7.6  8.0   Total Bilirubin 0.3 - 1.2 mg/dL 0.5  0.4  0.5   Alkaline Phos 38 - 126 U/L 65  63  62   AST 15 - 41 U/L 17  19  16    ALT 0 - 44 U/L 11  12  8      Lab Results   Component Value Date   WBC 7.4 09/04/2023   HGB 10.1 (L) 09/04/2023   HCT 32.1 (L) 09/04/2023   MCV 81.7 09/04/2023   PLT 394 09/04/2023   NEUTROABS 4.2 09/04/2023    ASSESSMENT & PLAN:  Essential thrombocytosis (HCC) 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 Possible missed doses. -Continue Anagrelide 1mg  twice daily. -Refill Anagrelide prescription.  Anemia Hemoglobin at 10, slightly low. Iron supplementation previously tried without significant improvement or noticeable benefit. -Consider over-the-counter iron supplement daily, with caution for potential constipation.  Follow-up Plan to return in January for next appointment. -Schedule follow-up appointment for second week of January 2025. -Check iron levels at that time.          Orders Placed This Encounter  Procedures   CBC with Differential (Cancer Center Only)    Standing Status:   Future    Standing Expiration Date:   09/03/2024   Ferritin    Standing Status:   Future    Standing Expiration Date:   09/03/2024   Iron and Iron Binding Capacity ([facility] only)    Standing Status:   Future    Standing Expiration Date:   09/03/2024   CMP (Cancer Center only)    Standing Status:   Future    Standing Expiration Date:   09/03/2024   The patient has a good understanding of the overall  plan. she agrees with it. she will call with any problems that may develop before the next visit here. Total time spent: 30 mins including face to face time and time spent for planning, charting and co-ordination of care   Tamsen Meek, MD 09/04/23

## 2023-09-07 DIAGNOSIS — L603 Nail dystrophy: Secondary | ICD-10-CM | POA: Diagnosis not present

## 2023-09-07 DIAGNOSIS — I739 Peripheral vascular disease, unspecified: Secondary | ICD-10-CM | POA: Diagnosis not present

## 2023-09-07 DIAGNOSIS — E1151 Type 2 diabetes mellitus with diabetic peripheral angiopathy without gangrene: Secondary | ICD-10-CM | POA: Diagnosis not present

## 2023-09-07 DIAGNOSIS — E1142 Type 2 diabetes mellitus with diabetic polyneuropathy: Secondary | ICD-10-CM | POA: Diagnosis not present

## 2023-09-10 DIAGNOSIS — G250 Essential tremor: Secondary | ICD-10-CM | POA: Diagnosis not present

## 2023-09-12 ENCOUNTER — Other Ambulatory Visit: Payer: Self-pay

## 2023-09-12 ENCOUNTER — Other Ambulatory Visit (HOSPITAL_COMMUNITY): Payer: Self-pay

## 2023-11-13 ENCOUNTER — Inpatient Hospital Stay: Payer: Medicare Other | Attending: Hematology and Oncology

## 2023-11-13 ENCOUNTER — Inpatient Hospital Stay (HOSPITAL_BASED_OUTPATIENT_CLINIC_OR_DEPARTMENT_OTHER): Payer: Medicare Other | Admitting: Hematology and Oncology

## 2023-11-13 VITALS — BP 137/99 | HR 83 | Temp 97.9°F | Resp 18 | Ht 61.0 in | Wt 136.2 lb

## 2023-11-13 DIAGNOSIS — R630 Anorexia: Secondary | ICD-10-CM | POA: Diagnosis not present

## 2023-11-13 DIAGNOSIS — D649 Anemia, unspecified: Secondary | ICD-10-CM | POA: Insufficient documentation

## 2023-11-13 DIAGNOSIS — D473 Essential (hemorrhagic) thrombocythemia: Secondary | ICD-10-CM

## 2023-11-13 DIAGNOSIS — D75839 Thrombocytosis, unspecified: Secondary | ICD-10-CM | POA: Diagnosis not present

## 2023-11-13 DIAGNOSIS — R531 Weakness: Secondary | ICD-10-CM | POA: Diagnosis not present

## 2023-11-13 LAB — CBC WITH DIFFERENTIAL (CANCER CENTER ONLY)
Abs Immature Granulocytes: 0.04 10*3/uL (ref 0.00–0.07)
Basophils Absolute: 0.1 10*3/uL (ref 0.0–0.1)
Basophils Relative: 1 %
Eosinophils Absolute: 0.4 10*3/uL (ref 0.0–0.5)
Eosinophils Relative: 5 %
HCT: 30.6 % — ABNORMAL LOW (ref 36.0–46.0)
Hemoglobin: 10.1 g/dL — ABNORMAL LOW (ref 12.0–15.0)
Immature Granulocytes: 1 %
Lymphocytes Relative: 29 %
Lymphs Abs: 2.4 10*3/uL (ref 0.7–4.0)
MCH: 26.3 pg (ref 26.0–34.0)
MCHC: 33 g/dL (ref 30.0–36.0)
MCV: 79.7 fL — ABNORMAL LOW (ref 80.0–100.0)
Monocytes Absolute: 1.1 10*3/uL — ABNORMAL HIGH (ref 0.1–1.0)
Monocytes Relative: 13 %
Neutro Abs: 4.2 10*3/uL (ref 1.7–7.7)
Neutrophils Relative %: 51 %
Platelet Count: 336 10*3/uL (ref 150–400)
RBC: 3.84 MIL/uL — ABNORMAL LOW (ref 3.87–5.11)
RDW: 18.3 % — ABNORMAL HIGH (ref 11.5–15.5)
WBC Count: 8.2 10*3/uL (ref 4.0–10.5)
nRBC: 0 % (ref 0.0–0.2)

## 2023-11-13 LAB — CMP (CANCER CENTER ONLY)
ALT: 8 U/L (ref 0–44)
AST: 16 U/L (ref 15–41)
Albumin: 3.6 g/dL (ref 3.5–5.0)
Alkaline Phosphatase: 62 U/L (ref 38–126)
Anion gap: 7 (ref 5–15)
BUN: 20 mg/dL (ref 8–23)
CO2: 30 mmol/L (ref 22–32)
Calcium: 9.2 mg/dL (ref 8.9–10.3)
Chloride: 101 mmol/L (ref 98–111)
Creatinine: 1.26 mg/dL — ABNORMAL HIGH (ref 0.44–1.00)
GFR, Estimated: 42 mL/min — ABNORMAL LOW (ref >60)
Glucose, Bld: 77 mg/dL (ref 70–99)
Potassium: 4.1 mmol/L (ref 3.5–5.1)
Sodium: 138 mmol/L (ref 135–145)
Total Bilirubin: 0.5 mg/dL (ref 0.0–1.2)
Total Protein: 7.2 g/dL (ref 6.5–8.1)

## 2023-11-13 LAB — IRON AND IRON BINDING CAPACITY (CC-WL,HP ONLY)
Iron: 51 ug/dL (ref 28–170)
Saturation Ratios: 23 % (ref 10.4–31.8)
TIBC: 227 ug/dL — ABNORMAL LOW (ref 250–450)
UIBC: 176 ug/dL (ref 148–442)

## 2023-11-13 LAB — FERRITIN: Ferritin: 238 ng/mL (ref 11–307)

## 2023-11-13 NOTE — Progress Notes (Signed)
 Patient Care Team: Catalina Bare, MD as PCP - General (Internal Medicine)  DIAGNOSIS:  Encounter Diagnosis  Name Primary?   Essential thrombocytosis (HCC) Yes    CHIEF COMPLIANT: Follow-up of anemia and essential thrombocytosis  HISTORY OF PRESENT ILLNESS:  History of Present Illness   The patient, with a history of anemia and on Anagrelide , presents with ongoing weakness and loss of appetite. She reports feeling 'a little bit' better after receiving iron  infusions, but the symptoms persist. She denies any adverse effects from the Anagrelide . The patient's weight has remained relatively stable, with a slight loss noted. She has not been taking any iron  supplements at home due to concerns about constipation.         ALLERGIES:  is allergic to shellfish allergy.  MEDICATIONS:  Current Outpatient Medications  Medication Sig Dispense Refill   acetaminophen  (TYLENOL ) 325 MG tablet Take 650 mg by mouth every 6 (six) hours as needed for moderate pain or headache.     amLODipine  (NORVASC ) 10 MG tablet Take 0.5 tablets (5 mg total) by mouth daily. 1 tablet 0   anagrelide  (AGRYLIN) 0.5 MG capsule Take 2 capsules (1 mg) by mouth 2 times daily. 360 capsule 3   folic acid  (FOLVITE ) 1 MG tablet TK 1 T PO QD  5   furosemide  (LASIX ) 20 MG tablet Take 1 tablet (20 mg total) by mouth as needed. 90 tablet 3   gabapentin (NEURONTIN) 300 MG capsule Take 300 mg by mouth daily.     levothyroxine  (SYNTHROID ) 100 MCG tablet Take 1 tablet (100 mcg total) by mouth daily before breakfast. 30 tablet 3   losartan  (COZAAR ) 50 MG tablet Take 1 tablet (50 mg total) by mouth daily. 90 tablet 3   meloxicam (MOBIC) 7.5 MG tablet Take 7.5-15 mg by mouth daily.     metFORMIN (GLUCOPHAGE) 500 MG tablet Take by mouth daily.     metoprolol  succinate (TOPROL -XL) 25 MG 24 hr tablet Take 1 tablet (25 mg total) by mouth daily. (Patient not taking: Reported on 05/24/2022) 90 tablet 3   nitroGLYCERIN  (NITROSTAT ) 0.4 MG  SL tablet Place 1 tablet (0.4 mg total) under the tongue every 5 (five) minutes as needed for chest pain. 90 tablet 3   omeprazole  (PRILOSEC ) 20 MG capsule TAKE ONE CAPSULE BY MOUTH EVERY DAY 90 capsule 3   polyethylene glycol (MIRALAX  / GLYCOLAX ) packet Take 17 g by mouth daily. 30 each 0   potassium chloride  SA (KLOR-CON ) 20 MEQ tablet TAKE 1 TABLET(20 MEQ) BY MOUTH DAILY WITH LASIX  AND FUROSEMIDE  AS NEEDED 90 tablet 3   primidone  (MYSOLINE ) 50 MG tablet Take 50 mg by mouth at bedtime. (Patient not taking: Reported on 05/24/2022)  3   rosuvastatin  (CRESTOR ) 10 MG tablet Take 1 tablet (10 mg total) by mouth daily. 90 tablet 3   senna (SENOKOT) 8.6 MG TABS tablet Take 2 tablets (17.2 mg total) by mouth daily as needed for moderate constipation. 30 each 0   vitamin B-12 (CYANOCOBALAMIN) 1000 MCG tablet Take 1,000 mcg by mouth daily.     No current facility-administered medications for this visit.    PHYSICAL EXAMINATION: ECOG PERFORMANCE STATUS: 1 - Symptomatic but completely ambulatory  Vitals:   11/13/23 1311  BP: (!) 137/99  Pulse: 83  Resp: 18  Temp: 97.9 F (36.6 C)  SpO2: 97%   Filed Weights   11/13/23 1311  Weight: 136 lb 3.2 oz (61.8 kg)    Physical Exam   MEASUREMENTS: WT- 136 pounds      (  exam performed in the presence of a chaperone)  LABORATORY DATA:  I have reviewed the data as listed    Latest Ref Rng & Units 11/13/2023   12:48 PM 09/04/2023    8:40 AM 06/14/2023    9:02 AM  CMP  Glucose 70 - 99 mg/dL 77  859  94   BUN 8 - 23 mg/dL 20  23  24    Creatinine 0.44 - 1.00 mg/dL 8.73  8.60  8.97   Sodium 135 - 145 mmol/L 138  140  142   Potassium 3.5 - 5.1 mmol/L 4.1  3.8  3.9   Chloride 98 - 111 mmol/L 101  104  105   CO2 22 - 32 mmol/L 30  29  29    Calcium  8.9 - 10.3 mg/dL 9.2  9.3  9.2   Total Protein 6.5 - 8.1 g/dL 7.2  7.3  7.6   Total Bilirubin 0.0 - 1.2 mg/dL 0.5  0.5  0.4   Alkaline Phos 38 - 126 U/L 62  65  63   AST 15 - 41 U/L 16  17  19    ALT 0 - 44  U/L 8  11  12      Lab Results  Component Value Date   WBC 8.2 11/13/2023   HGB 10.1 (L) 11/13/2023   HCT 30.6 (L) 11/13/2023   MCV 79.7 (L) 11/13/2023   PLT 336 11/13/2023   NEUTROABS 4.2 11/13/2023    ASSESSMENT & PLAN:  Essential thrombocytosis (HCC) 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 ------------------------------------- Assessment and Plan    Anemia Patient reported feeling a little better after receiving iron  treatment. However, she continues to experience weakness and loss of appetite. - Await results of blood tests to assess hemoglobin levels and response to iron  treatment.  Decreased Appetite Patient reported a lack of appetite and minimal food intake. Stable weight over the past few months. - Monitor weight and nutritional intake.  Medication Tolerance Patient reported tolerating Anagrelide  well with no reported side effects. - Continue Anagrelide  as prescribed.     Labs and follow-up in 3 months     Orders Placed This Encounter  Procedures   CBC with Differential (Cancer Center Only)    Standing Status:   Future    Expiration Date:   11/12/2024   Ferritin    Standing Status:   Future    Expiration Date:   11/12/2024   Iron  and Iron  Binding Capacity ([facility] only)    Standing Status:   Future    Expiration Date:   11/12/2024   CMP (Cancer Center only)    Standing Status:   Future    Expiration Date:   11/12/2024   The  patient has a good understanding of the overall plan. she agrees with it. she will call with any problems that may develop before the next visit here. Total time spent: 30 mins including face to face time and time spent for planning, charting and co-ordination  of care   Naomi MARLA Chad, MD 11/13/23

## 2023-11-13 NOTE — Assessment & Plan Note (Signed)
 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

## 2023-11-15 ENCOUNTER — Encounter: Payer: Self-pay | Admitting: Hematology and Oncology

## 2023-11-16 DIAGNOSIS — H6123 Impacted cerumen, bilateral: Secondary | ICD-10-CM | POA: Diagnosis not present

## 2023-11-21 DIAGNOSIS — H1013 Acute atopic conjunctivitis, bilateral: Secondary | ICD-10-CM | POA: Diagnosis not present

## 2023-11-21 DIAGNOSIS — E7211 Homocystinuria: Secondary | ICD-10-CM | POA: Diagnosis not present

## 2023-11-21 DIAGNOSIS — G250 Essential tremor: Secondary | ICD-10-CM | POA: Diagnosis not present

## 2023-11-21 DIAGNOSIS — N3000 Acute cystitis without hematuria: Secondary | ICD-10-CM | POA: Diagnosis not present

## 2023-11-21 DIAGNOSIS — E039 Hypothyroidism, unspecified: Secondary | ICD-10-CM | POA: Diagnosis not present

## 2023-11-21 DIAGNOSIS — K219 Gastro-esophageal reflux disease without esophagitis: Secondary | ICD-10-CM | POA: Diagnosis not present

## 2023-11-21 DIAGNOSIS — E782 Mixed hyperlipidemia: Secondary | ICD-10-CM | POA: Diagnosis not present

## 2023-11-21 DIAGNOSIS — N1831 Chronic kidney disease, stage 3a: Secondary | ICD-10-CM | POA: Diagnosis not present

## 2023-11-21 DIAGNOSIS — E1142 Type 2 diabetes mellitus with diabetic polyneuropathy: Secondary | ICD-10-CM | POA: Diagnosis not present

## 2023-11-21 DIAGNOSIS — I119 Hypertensive heart disease without heart failure: Secondary | ICD-10-CM | POA: Diagnosis not present

## 2023-11-21 DIAGNOSIS — E1122 Type 2 diabetes mellitus with diabetic chronic kidney disease: Secondary | ICD-10-CM | POA: Diagnosis not present

## 2023-12-05 DIAGNOSIS — G250 Essential tremor: Secondary | ICD-10-CM | POA: Diagnosis not present

## 2023-12-17 ENCOUNTER — Other Ambulatory Visit (HOSPITAL_COMMUNITY): Payer: Self-pay

## 2023-12-18 ENCOUNTER — Other Ambulatory Visit (HOSPITAL_COMMUNITY): Payer: Self-pay

## 2023-12-18 ENCOUNTER — Other Ambulatory Visit: Payer: Self-pay

## 2023-12-22 ENCOUNTER — Other Ambulatory Visit: Payer: Self-pay

## 2024-01-03 DIAGNOSIS — H25813 Combined forms of age-related cataract, bilateral: Secondary | ICD-10-CM | POA: Diagnosis not present

## 2024-01-03 DIAGNOSIS — E119 Type 2 diabetes mellitus without complications: Secondary | ICD-10-CM | POA: Diagnosis not present

## 2024-01-03 DIAGNOSIS — H401134 Primary open-angle glaucoma, bilateral, indeterminate stage: Secondary | ICD-10-CM | POA: Diagnosis not present

## 2024-01-12 ENCOUNTER — Encounter: Payer: Self-pay | Admitting: Hematology and Oncology

## 2024-01-17 ENCOUNTER — Inpatient Hospital Stay (HOSPITAL_COMMUNITY)

## 2024-01-17 ENCOUNTER — Other Ambulatory Visit: Payer: Self-pay

## 2024-01-17 ENCOUNTER — Encounter: Payer: Self-pay | Admitting: Hematology and Oncology

## 2024-01-17 ENCOUNTER — Encounter (HOSPITAL_COMMUNITY): Payer: Self-pay

## 2024-01-17 ENCOUNTER — Inpatient Hospital Stay (HOSPITAL_COMMUNITY)
Admission: EM | Admit: 2024-01-17 | Discharge: 2024-01-30 | DRG: 330 | Disposition: A | Discharge status: Home Health | Attending: Family Medicine | Admitting: Family Medicine

## 2024-01-17 ENCOUNTER — Emergency Department (HOSPITAL_COMMUNITY)

## 2024-01-17 DIAGNOSIS — K449 Diaphragmatic hernia without obstruction or gangrene: Secondary | ICD-10-CM | POA: Diagnosis not present

## 2024-01-17 DIAGNOSIS — D75839 Thrombocytosis, unspecified: Secondary | ICD-10-CM | POA: Diagnosis present

## 2024-01-17 DIAGNOSIS — E039 Hypothyroidism, unspecified: Secondary | ICD-10-CM | POA: Diagnosis present

## 2024-01-17 DIAGNOSIS — C180 Malignant neoplasm of cecum: Secondary | ICD-10-CM | POA: Diagnosis not present

## 2024-01-17 DIAGNOSIS — R9431 Abnormal electrocardiogram [ECG] [EKG]: Secondary | ICD-10-CM | POA: Diagnosis not present

## 2024-01-17 DIAGNOSIS — R079 Chest pain, unspecified: Secondary | ICD-10-CM | POA: Diagnosis not present

## 2024-01-17 DIAGNOSIS — D120 Benign neoplasm of cecum: Secondary | ICD-10-CM | POA: Diagnosis not present

## 2024-01-17 DIAGNOSIS — C182 Malignant neoplasm of ascending colon: Secondary | ICD-10-CM | POA: Diagnosis not present

## 2024-01-17 DIAGNOSIS — E785 Hyperlipidemia, unspecified: Secondary | ICD-10-CM | POA: Diagnosis present

## 2024-01-17 DIAGNOSIS — N281 Cyst of kidney, acquired: Secondary | ICD-10-CM | POA: Diagnosis not present

## 2024-01-17 DIAGNOSIS — K567 Ileus, unspecified: Secondary | ICD-10-CM | POA: Diagnosis not present

## 2024-01-17 DIAGNOSIS — I272 Pulmonary hypertension, unspecified: Secondary | ICD-10-CM | POA: Diagnosis present

## 2024-01-17 DIAGNOSIS — Z6827 Body mass index (BMI) 27.0-27.9, adult: Secondary | ICD-10-CM

## 2024-01-17 DIAGNOSIS — E8809 Other disorders of plasma-protein metabolism, not elsewhere classified: Secondary | ICD-10-CM | POA: Diagnosis not present

## 2024-01-17 DIAGNOSIS — Z79890 Hormone replacement therapy (postmenopausal): Secondary | ICD-10-CM | POA: Diagnosis not present

## 2024-01-17 DIAGNOSIS — E876 Hypokalemia: Secondary | ICD-10-CM | POA: Diagnosis present

## 2024-01-17 DIAGNOSIS — I509 Heart failure, unspecified: Secondary | ICD-10-CM | POA: Diagnosis not present

## 2024-01-17 DIAGNOSIS — E860 Dehydration: Secondary | ICD-10-CM | POA: Diagnosis present

## 2024-01-17 DIAGNOSIS — K439 Ventral hernia without obstruction or gangrene: Secondary | ICD-10-CM | POA: Diagnosis not present

## 2024-01-17 DIAGNOSIS — I517 Cardiomegaly: Secondary | ICD-10-CM | POA: Diagnosis not present

## 2024-01-17 DIAGNOSIS — C772 Secondary and unspecified malignant neoplasm of intra-abdominal lymph nodes: Secondary | ICD-10-CM | POA: Diagnosis present

## 2024-01-17 DIAGNOSIS — Z79899 Other long term (current) drug therapy: Secondary | ICD-10-CM | POA: Diagnosis not present

## 2024-01-17 DIAGNOSIS — R140 Abdominal distension (gaseous): Secondary | ICD-10-CM | POA: Diagnosis not present

## 2024-01-17 DIAGNOSIS — N179 Acute kidney failure, unspecified: Secondary | ICD-10-CM | POA: Diagnosis present

## 2024-01-17 DIAGNOSIS — E119 Type 2 diabetes mellitus without complications: Secondary | ICD-10-CM | POA: Diagnosis not present

## 2024-01-17 DIAGNOSIS — Z7984 Long term (current) use of oral hypoglycemic drugs: Secondary | ICD-10-CM

## 2024-01-17 DIAGNOSIS — R918 Other nonspecific abnormal finding of lung field: Secondary | ICD-10-CM | POA: Diagnosis not present

## 2024-01-17 DIAGNOSIS — D473 Essential (hemorrhagic) thrombocythemia: Secondary | ICD-10-CM | POA: Diagnosis not present

## 2024-01-17 DIAGNOSIS — I10 Essential (primary) hypertension: Secondary | ICD-10-CM | POA: Diagnosis not present

## 2024-01-17 DIAGNOSIS — R1084 Generalized abdominal pain: Secondary | ICD-10-CM | POA: Diagnosis not present

## 2024-01-17 DIAGNOSIS — N390 Urinary tract infection, site not specified: Secondary | ICD-10-CM | POA: Diagnosis not present

## 2024-01-17 DIAGNOSIS — D62 Acute posthemorrhagic anemia: Secondary | ICD-10-CM | POA: Diagnosis not present

## 2024-01-17 DIAGNOSIS — D500 Iron deficiency anemia secondary to blood loss (chronic): Secondary | ICD-10-CM | POA: Diagnosis present

## 2024-01-17 DIAGNOSIS — K9189 Other postprocedural complications and disorders of digestive system: Secondary | ICD-10-CM | POA: Diagnosis present

## 2024-01-17 DIAGNOSIS — N1831 Chronic kidney disease, stage 3a: Secondary | ICD-10-CM

## 2024-01-17 DIAGNOSIS — Z91013 Allergy to seafood: Secondary | ICD-10-CM

## 2024-01-17 DIAGNOSIS — J9811 Atelectasis: Secondary | ICD-10-CM | POA: Diagnosis not present

## 2024-01-17 DIAGNOSIS — Z452 Encounter for adjustment and management of vascular access device: Secondary | ICD-10-CM | POA: Diagnosis not present

## 2024-01-17 DIAGNOSIS — K56609 Unspecified intestinal obstruction, unspecified as to partial versus complete obstruction: Principal | ICD-10-CM | POA: Diagnosis present

## 2024-01-17 DIAGNOSIS — E663 Overweight: Secondary | ICD-10-CM | POA: Diagnosis present

## 2024-01-17 DIAGNOSIS — D471 Chronic myeloproliferative disease: Secondary | ICD-10-CM | POA: Diagnosis present

## 2024-01-17 DIAGNOSIS — E1122 Type 2 diabetes mellitus with diabetic chronic kidney disease: Secondary | ICD-10-CM | POA: Diagnosis present

## 2024-01-17 DIAGNOSIS — K6389 Other specified diseases of intestine: Secondary | ICD-10-CM

## 2024-01-17 DIAGNOSIS — I129 Hypertensive chronic kidney disease with stage 1 through stage 4 chronic kidney disease, or unspecified chronic kidney disease: Secondary | ICD-10-CM | POA: Diagnosis not present

## 2024-01-17 DIAGNOSIS — I130 Hypertensive heart and chronic kidney disease with heart failure and stage 1 through stage 4 chronic kidney disease, or unspecified chronic kidney disease: Secondary | ICD-10-CM | POA: Diagnosis not present

## 2024-01-17 DIAGNOSIS — R161 Splenomegaly, not elsewhere classified: Secondary | ICD-10-CM | POA: Diagnosis not present

## 2024-01-17 DIAGNOSIS — J90 Pleural effusion, not elsewhere classified: Secondary | ICD-10-CM | POA: Diagnosis not present

## 2024-01-17 DIAGNOSIS — Z4682 Encounter for fitting and adjustment of non-vascular catheter: Secondary | ICD-10-CM | POA: Diagnosis not present

## 2024-01-17 DIAGNOSIS — H409 Unspecified glaucoma: Secondary | ICD-10-CM | POA: Diagnosis present

## 2024-01-17 DIAGNOSIS — K5669 Other partial intestinal obstruction: Secondary | ICD-10-CM | POA: Diagnosis not present

## 2024-01-17 DIAGNOSIS — K219 Gastro-esophageal reflux disease without esophagitis: Secondary | ICD-10-CM | POA: Diagnosis present

## 2024-01-17 DIAGNOSIS — N289 Disorder of kidney and ureter, unspecified: Secondary | ICD-10-CM | POA: Diagnosis not present

## 2024-01-17 DIAGNOSIS — Z8249 Family history of ischemic heart disease and other diseases of the circulatory system: Secondary | ICD-10-CM

## 2024-01-17 LAB — CBC WITH DIFFERENTIAL/PLATELET
Abs Immature Granulocytes: 0.07 10*3/uL (ref 0.00–0.07)
Basophils Absolute: 0 10*3/uL (ref 0.0–0.1)
Basophils Relative: 0 %
Eosinophils Absolute: 0.1 10*3/uL (ref 0.0–0.5)
Eosinophils Relative: 1 %
HCT: 31.6 % — ABNORMAL LOW (ref 36.0–46.0)
Hemoglobin: 9.9 g/dL — ABNORMAL LOW (ref 12.0–15.0)
Immature Granulocytes: 1 %
Lymphocytes Relative: 11 %
Lymphs Abs: 1.3 10*3/uL (ref 0.7–4.0)
MCH: 25.5 pg — ABNORMAL LOW (ref 26.0–34.0)
MCHC: 31.3 g/dL (ref 30.0–36.0)
MCV: 81.4 fL (ref 80.0–100.0)
Monocytes Absolute: 0.7 10*3/uL (ref 0.1–1.0)
Monocytes Relative: 6 %
Neutro Abs: 9.6 10*3/uL — ABNORMAL HIGH (ref 1.7–7.7)
Neutrophils Relative %: 81 %
Platelets: 465 10*3/uL — ABNORMAL HIGH (ref 150–400)
RBC: 3.88 MIL/uL (ref 3.87–5.11)
RDW: 18.4 % — ABNORMAL HIGH (ref 11.5–15.5)
WBC: 11.9 10*3/uL — ABNORMAL HIGH (ref 4.0–10.5)
nRBC: 0 % (ref 0.0–0.2)

## 2024-01-17 LAB — URINALYSIS, ROUTINE W REFLEX MICROSCOPIC
Bilirubin Urine: NEGATIVE
Glucose, UA: NEGATIVE mg/dL
Hgb urine dipstick: NEGATIVE
Ketones, ur: NEGATIVE mg/dL
Nitrite: NEGATIVE
Protein, ur: 30 mg/dL — AB
Specific Gravity, Urine: 1.028 (ref 1.005–1.030)
pH: 5 (ref 5.0–8.0)

## 2024-01-17 LAB — GLUCOSE, CAPILLARY: Glucose-Capillary: 113 mg/dL — ABNORMAL HIGH (ref 70–99)

## 2024-01-17 LAB — COMPREHENSIVE METABOLIC PANEL
ALT: 9 U/L (ref 0–44)
AST: 20 U/L (ref 15–41)
Albumin: 2.8 g/dL — ABNORMAL LOW (ref 3.5–5.0)
Alkaline Phosphatase: 53 U/L (ref 38–126)
Anion gap: 10 (ref 5–15)
BUN: 19 mg/dL (ref 8–23)
CO2: 25 mmol/L (ref 22–32)
Calcium: 8.9 mg/dL (ref 8.9–10.3)
Chloride: 105 mmol/L (ref 98–111)
Creatinine, Ser: 1.07 mg/dL — ABNORMAL HIGH (ref 0.44–1.00)
GFR, Estimated: 51 mL/min — ABNORMAL LOW (ref >60)
Glucose, Bld: 120 mg/dL — ABNORMAL HIGH (ref 70–99)
Potassium: 3.2 mmol/L — ABNORMAL LOW (ref 3.5–5.1)
Sodium: 140 mmol/L (ref 135–145)
Total Bilirubin: 0.5 mg/dL (ref 0.0–1.2)
Total Protein: 6.5 g/dL (ref 6.5–8.1)

## 2024-01-17 LAB — LIPASE, BLOOD: Lipase: 17 U/L (ref 11–51)

## 2024-01-17 MED ORDER — LACTATED RINGERS IV SOLN: INTRAVENOUS | Status: DC

## 2024-01-17 MED ORDER — INSULIN ASPART 100 UNIT/ML IJ SOLN
0.0000 [IU] | INTRAMUSCULAR | Status: DC
  Administered 2024-01-18: 2 [IU] via SUBCUTANEOUS
  Administered 2024-01-18: 3 [IU] via SUBCUTANEOUS

## 2024-01-17 MED ORDER — IOHEXOL 350 MG/ML SOLN
75.0000 mL | Freq: Once | INTRAVENOUS | Status: AC | PRN
  Administered 2024-01-17: 75 mL via INTRAVENOUS

## 2024-01-17 MED ORDER — HEPARIN SODIUM (PORCINE) 5000 UNIT/ML IJ SOLN
5000.0000 [IU] | Freq: Three times a day (TID) | INTRAMUSCULAR | Status: DC
  Administered 2024-01-18 – 2024-01-30 (×36): 5000 [IU] via SUBCUTANEOUS
  Filled 2024-01-17 (×36): qty 1

## 2024-01-17 MED ORDER — POTASSIUM CHLORIDE 10 MEQ/100ML IV SOLN
10.0000 meq | INTRAVENOUS | Status: AC
  Administered 2024-01-17 – 2024-01-18 (×4): 10 meq via INTRAVENOUS
  Filled 2024-01-17: qty 100

## 2024-01-17 MED ORDER — ONDANSETRON HCL 4 MG/2ML IJ SOLN: 4.0000 mg | Freq: Four times a day (QID) | INTRAMUSCULAR | Status: DC | PRN

## 2024-01-17 MED ORDER — ACETAMINOPHEN 650 MG RE SUPP: 650.0000 mg | Freq: Four times a day (QID) | RECTAL | Status: DC | PRN

## 2024-01-17 MED ORDER — HYDRALAZINE HCL 20 MG/ML IJ SOLN
10.0000 mg | INTRAMUSCULAR | Status: DC | PRN
  Administered 2024-01-18 – 2024-01-25 (×5): 10 mg via INTRAVENOUS
  Filled 2024-01-17 (×6): qty 1

## 2024-01-17 MED ORDER — ONDANSETRON HCL 4 MG/2ML IJ SOLN
4.0000 mg | Freq: Once | INTRAMUSCULAR | Status: AC
  Administered 2024-01-17: 4 mg via INTRAVENOUS
  Filled 2024-01-17: qty 2

## 2024-01-17 MED ORDER — HYDROMORPHONE HCL 1 MG/ML IJ SOLN
0.5000 mg | INTRAMUSCULAR | Status: DC | PRN
  Administered 2024-01-18: .5 mg via INTRAVENOUS
  Administered 2024-01-18: 1 mg via INTRAVENOUS
  Filled 2024-01-17: qty 1

## 2024-01-17 NOTE — ED Triage Notes (Signed)
 PT BIB [facility] from home, complaining of abdominal  pain, nausea and vomiting x3 days, states nausea became better after arrival. 2x emesis episode today prior to EMS. PT aox4, Diabetic, [facility] VSS other than CBG of 212.

## 2024-01-17 NOTE — Hospital Course (Addendum)
 [name] is a 88 y.o. 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  She underwent surgical intervention and pathology was consistent with adenocarcinoma.  Postoperatively she was having an ileus and issues with her NG as well as her PICC line so NG and PICC line removed.  Repeat CT scan of the abdomen pelvis showed no definite small bowel obstruction.  Did show a ventral hernia containing a section of bowel.  Diet is now been advanced to a clear liquid diet.  Assessment and Plan:  SBO 2/2 to Large ileocecal/ascending colonic mass--colonic adenocarcinoma with metastasis: CT abdomen and pelvis done consistent with small bowel obstruction secondary to large ileocecal/ascending colonic mass measuring up to 7.3 cm.  No perforation noted.  Enlarged mesenteric nodes in the vicinity of the colon mass suspicious for metastatic adenopathy.  Indeterminate splenic masses measuring up to 2.7 cm centimeters. NG tube replaced today by Radiology given that it was not positioned properly x2. -Patient seen by Gen Surgery who took the patient for open right hemicolectomy with ileocolic anastomosis on 01/18/2024.  Pathology consistent with colonic adenocarcinoma, 13 cm extending into pericolonic connective.  Negative surgical margins noted.  13 lymph nodes negative for metastatic carcinoma.  Negative for lymphovascular involvement.  7 separate tubular adenomas without high-grade dysplasia noted. Currently n.p.o. with bowel rest as we are Awaiting bowel function. Med Onc consulted will follow-up outpatient with Dr. Mosetta Putt.  Was getting TPN but her PICC line malfunction and had to be replaced by the interventional radiology team given that the PICC nurse could not get it.  She now has a dual-lumen PICC line placement to the right brachial  vein and is at the tip of the axillary vein due to chronic venous occlusion and unable to be passed beyond the axilla.  Postop ileus: Patient with postop ileus.Patient stated had some flatus this morning as well. Keep electrolytes repleted potassium approximately 4, magnesium approximately 2.  Mobilize.  Per general surgery. Lost NG Tube so Surgery recommending holding replacement now. Repeat CT Abd/Pelvis done showed no definite associated small bowel pressure but did show a ventral hernia containing small segment of small bowel.  She does not have send trace bilateral pleural effusions and some ascites as well as diffusely heterogeneous osseous mineralization and other findings.  Diet has not been advanced to a clear liquid diet.   Atypical Chest Pain: Patient noted with complaints of atypical chest pain localized on the left as well as left upper abdominal pain and nonradiating on 01/19/2024. Patient describes the pain as a gas bubble. Patient with somewhat reproducible chest pain. EKG with no ischemic changes noted.  Cardiac enzymes negative.   -Chest x-ray with increased density in the AP window which may reflect a dilated pulmonary artery, descending thoracic aortic aneurysm or lymphadenopathy.  CT chest recommended.  Stable enlarged cardiac silhouette.  No acute airspace disease.   -CT chest obtained without contrast with marked main pulmonary artery enlargement compatible with pulmonary hypertension relating to radiographic abnormality.  Aorta elongated and tortuous.  No mass or adenopathy in the chest.  Dependent atelectasis with small volume pleural fluid noted.   -Continue IV PPI twice daily, Voltaren gel, simethicone.  -TTE done and showed LVEF of 60-65% with no RWMA and LVDP c/w G1DD and RVSF normal -C/w Supportive care as she is clinically improved.   Iron Deficiency Anemia/Acute  Postop Anemia: Hgb/Hct stable but did drom from 11.6/35.5 -> 10.5/33.8. Likely postop acute blood loss anemia and  also likely partly dilutional as patient placed on IV fluids. Follow H&H. -Transfusion threshold hemoglobin < 8. Daughter stated she had a dark tarry stool. Check FOBT when having Bowel movements    Hypokalemia/ Hypomagnesemia/Hypophosphatemia: K+ 4.6 this AM, Phos 3.3 and Mg 2.1 this AM. CTM and replete as necessary. Repeat labs in the AM.   Klebsiella UTI: Urinalysis moderate leukocytes, nitrite negative, rare bacteria, WBC 21-50. Urine cultures with 30,000 colonies of Klebsiella pneumonia. S/p post 3 days IV Rocephin.   Vaginal Itching: Ordered    AKI on CKD stage IIIa, improving: Likely secondary to prerenal azotemia as patient presented with small bowel obstruction, nausea vomiting and has been n.p.o. since admission. BUN/Cr Trend: Recent Labs  Lab 01/20/24 0415 01/21/24 0646 01/22/24 0323 01/23/24 0316 01/24/24 0314 01/25/24 0614 01/26/24 0507  BUN 23 15 8 8  12 13 16 16   CREATININE 1.67* 1.28* 1.28* 1.21*  1.28* 1.03* 1.22* 1.19*  -Avoid Nephrotoxic Medications but will resume home Losartan as belwo, Contrast Dyes, Hypotension and Dehydration to Ensure Adequate Renal Perfusion and will need to Renally Adjust Meds; CTM and Trend Renal Function carefully and repeat CMP in the AM    Well-Controlled Diabetes Mellitus type 2: Hemoglobin A1c 5.7. Continue to Hold home regimen Glucophage. IVF now stopped CBGs ranging from 117-159 on last 5 checks   Essential Hypertension: Was n.p.o. due to small bowel obstruction and unable to take oral intake but diet is now advance to CLD. Resume oral antihypertensive medications with po Amlodipine 5 mg daily and Losartan 50 mg po daily. Increased IV Metoprolol Tartrate to 7.5 mg every 6 hours. C/w IV Hydralazine 10 mg q4hprn for SBP >180. CTM BP per Protcol. Last BP Reading was 154/84    Essential Thrombocytosis: Platelet Count trended up to 721 and is now 671. Follows with Hematology, Dr. [name]. Managed on Anagrelide which is currently on hold.  Heme/Onc following and recommending CTM and Trend and repeat CBC w/ Diff in the AM   Hypothyroidism: Will D/C IV Levothyroxine and resume po Levothyroxine in the AM   HLD: Continue to hold statin and could likely resume on discharge given that she remains NPO   Splenic masses: Indeterminate splenic masses measuring up to 2.7 cm noted on CT abdomen and pelvis. Repeat CT Scan Abd/Pelvis done and showed Persistent splenic lesions with the largest heterogeneous 2.8 cm stable mass. Will need an outpatient dedicated abdominal MRI for further evaluation once patient is clinically stable   Hypoalbuminemia: Albumin is now 2.2. CTM and Trend and repeat CMP in the AM  Overweight: Complicates overall prognosis and care. Estimated body mass index is 27.7 kg/m as calculated from the following:   Height as of this encounter: 5\' 1"  (1.549 m).   Weight as of this encounter: 66.5 kg. Weight Loss and Dietary Counseling given

## 2024-01-17 NOTE — H&P (Signed)
 History and Physical    [name] QIO:[id number] DOB: [birth year] DOA: 01/17/2024  PCP: Jackie Plum, MD  Patient coming from: Home  I have personally briefly reviewed patient's old medical records in [facility]  Chief Complaint: Abdominal pain, nausea, vomiting  HPI: [name] is a 88 y.o. female with medical history significant for T2DM, CKD stage IIIa, HTN, HLD, hypothyroidism, iron deficiency anemia, essential thrombocytosis, glaucoma who presented to the ED for evaluation of abdominal pain associated with nausea and vomiting.  Patient presenting the ED with approximately 3 days of lower abdominal pain associated with nausea and vomiting.  Symptoms have been progressively worsening over this time.  Last bowel movement was small-volume earlier today.  She does have a history of chronic constipation and uses prune juice to help with BMs.  Patient reports a history of remote gallbladder rupture requiring multiple abdominal surgeries after the birth of her youngest daughter.  She has not had a colonoscopy in the past.  ED Course  Labs/Imaging on admission: I have personally reviewed following labs and imaging studies.  Initial vitals showed BP 144/85, pulse 80, RR 17, temp 98.6 F, SpO2 96% on room air.  Labs showed sodium 140, potassium 3.2, bicarb 25, BUN 19, creatinine 1.07, serum glucose 120, LFTs within normal limits, WBC 11.9, hemoglobin 9.9, platelets 465,000, lipase 17.  UA showed negative nitrates, moderate leukocytes, 0-5 RBCs, 21-50 WBCs, rare bacteria.  Urine culture in process.  CT abdomen/pelvis with contrast showed SBO secondary to large ileocecal/ascending colon mass measuring up to 7.3 cm.  No findings to suggest perforation.  Enlarged mesenteric nodes in the vicinity of colon mass suspicious for metastatic adenopathy.  Indeterminate splenic masses measuring up to 2.7 cm seen.  Heterogeneous osseous mineralization of the spine and pelvis could be due to  osteopenia bone marrow disease not excluded.  Small volume free fluid in the pelvis, numerous fat-containing ventral hernias with some fluid in the hernia sacs, prior cholecystectomy changes with intra and extrahepatic biliary dilatation also seen.  Cortical atrophy lower pole left kidney with generalized hypoenhancement of the lower pole left kidney also reported, question pyelonephritis, appearance not typical for infarct.  General surgery were consulted and recommended medical admission.  NG tube ordered.  The hospitalist service was consulted to admit.  Review of Systems: All systems reviewed and are negative except as documented in history of present illness above.   Past Medical History:  Diagnosis Date   CHF (congestive heart failure) (HCC)    Hypertension    Pre-diabetes    Thrombocytosis    Thyroid disease     Past Surgical History:  Procedure Laterality Date   CHOLECYSTECTOMY      Social History: Social History   Tobacco Use   Smoking status: Never   Smokeless tobacco: Never  Vaping Use   Vaping status: Never Used  Substance Use Topics   Alcohol use: No   Drug use: Never    Allergies  Allergen Reactions   Shellfish Allergy Hives    Family History  Problem Relation Age of Onset   Hypertension Mother    Hypertension Father      Prior to Admission medications   Medication Sig Start Date End Date Taking? Authorizing Provider  acetaminophen (TYLENOL) 325 MG tablet Take 650 mg by mouth every 6 (six) hours as needed for moderate pain or headache.    [provider]  amLODipine (NORVASC) 10 MG tablet Take 0.5 tablets (5 mg total) by mouth daily. 05/24/22  Patwardhan, Manish J, MD  anagrelide (AGRYLIN) 0.5 MG capsule Take 2 capsules (1 mg) by mouth 2 times daily. 06/14/23   Serena Croissant, MD  folic acid (FOLVITE) 1 MG tablet TK 1 T PO QD 04/09/18   [provider]  furosemide (LASIX) 20 MG tablet Take 1 tablet (20 mg total) by mouth as needed.  06/22/23   Patwardhan, Anabel Bene, MD  gabapentin (NEURONTIN) 300 MG capsule Take 300 mg by mouth daily.    [provider]  levothyroxine (SYNTHROID) 100 MCG tablet Take 1 tablet (100 mcg total) by mouth daily before breakfast. 08/06/14   Reuben Likes, MD  losartan (COZAAR) 50 MG tablet Take 1 tablet (50 mg total) by mouth daily. 08/16/20   Patwardhan, Anabel Bene, MD  meloxicam (MOBIC) 7.5 MG tablet Take 7.5-15 mg by mouth daily. 03/16/20   [provider]  metFORMIN (GLUCOPHAGE) 500 MG tablet Take by mouth daily.    [provider]  metoprolol succinate (TOPROL-XL) 25 MG 24 hr tablet Take 1 tablet (25 mg total) by mouth daily. Patient not taking: Reported on 05/24/2022 08/16/20   Elder Negus, MD  nitroGLYCERIN (NITROSTAT) 0.4 MG SL tablet Place 1 tablet (0.4 mg total) under the tongue every 5 (five) minutes as needed for chest pain. 08/16/20   Patwardhan, Anabel Bene, MD  omeprazole (PRILOSEC) 20 MG capsule TAKE ONE CAPSULE BY MOUTH EVERY DAY 07/22/19   Patwardhan, Manish J, MD  polyethylene glycol (MIRALAX / GLYCOLAX) packet Take 17 g by mouth daily. 07/13/14   Hongalgi, Maximino Greenland, MD  potassium chloride SA (KLOR-CON) 20 MEQ tablet TAKE 1 TABLET(20 MEQ) BY MOUTH DAILY WITH LASIX AND FUROSEMIDE AS NEEDED 08/01/21   Patwardhan, Anabel Bene, MD  primidone (MYSOLINE) 50 MG tablet Take 50 mg by mouth at bedtime. Patient not taking: Reported on 05/24/2022 04/11/18   [provider]  rosuvastatin (CRESTOR) 10 MG tablet Take 1 tablet (10 mg total) by mouth daily. 08/16/20   Patwardhan, Anabel Bene, MD  senna (SENOKOT) 8.6 MG TABS tablet Take 2 tablets (17.2 mg total) by mouth daily as needed for moderate constipation. 07/13/14   Hongalgi, Maximino Greenland, MD  vitamin B-12 (CYANOCOBALAMIN) 1000 MCG tablet Take 1,000 mcg by mouth daily. 07/29/20   [provider]    Physical Exam: Vitals:   01/17/24 1715 01/17/24 2000 01/17/24 2300 01/17/24 2319  BP:  (!) 165/95 (!) 165/83    Pulse:  90 81   Resp:  15 18   Temp: 99.7 F (37.6 C)   99 F (37.2 C)  TempSrc: Oral     SpO2:  96% 95%   Weight:      Height:       Constitutional: Resting in bed, NAD, calm, appears somewhat uncomfortable Eyes: PERRL, lids and conjunctivae normal ENMT: NG tube in right nares.  Mucous membranes are moist. Posterior pharynx clear of any exudate or lesions.Normal dentition.  Neck: normal, supple, no masses. Respiratory: clear to auscultation bilaterally, no wheezing, no crackles. Normal respiratory effort. No accessory muscle use.  Cardiovascular: Regular rate and rhythm, no murmurs / rubs / gallops. No extremity edema. 2+ pedal pulses. Abdomen: Right mid and lower tenderness, no masses palpated. Musculoskeletal: no clubbing / cyanosis. No joint deformity upper and lower extremities. Good ROM, no contractures. Normal muscle tone.  Skin: no rashes, lesions, ulcers. No induration Neurologic: Sensation intact. Strength 5/5 in all 4.  Psychiatric: Normal judgment and insight. Alert and oriented x 3. Normal mood.  EKG: Personally reviewed. Sinus rhythm, rate 83, multiple PACs and PVCs.  Assessment/Plan Principal Problem:   Small bowel obstruction (HCC) Active Problems:   Hypothyroidism   Essential hypertension, benign   Essential thrombocytosis (HCC)   Dyslipidemia   Iron deficiency anemia due to chronic blood loss   Colonic mass   Chronic kidney disease, stage 3a (HCC)   [name] is a 88 y.o. 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  Assessment and Plan: Small bowel obstruction secondary to large ileocecal/ascending colonic mass--suspected malignancy: -General Surgery to consult -Keep n.p.o. -NG tube placed -Continue IV fluid hydration, antiemetics and analgesics as  needed  Iron deficiency anemia: Hemoglobin stable at 9.9.  Continue to monitor.  CKD stage IIIa: Renal function stable.  Hypokalemia: IV supplement ordered.  Type 2 diabetes: Placed on sensitive SSI q4h while NPO.  Hypertension: Home meds on hold while NPO.  IV hydralazine ordered as needed.  Essential thrombocytosis: Follows with hematology Dr. Candise Che.  Managed on anagrelide which we are holding.  Hypothyroidism: Synthroid on hold while NPO.  Can placed on IV Synthroid if not tolerating PO next 48 hours.  Hyperlipidemia: Rosuvastatin on hold while NPO.  Splenic masses: Indeterminate splenic masses measuring up to 2.7 cm noted on CT A/P.  Consider dedicated abdominal MRI when patient clinically stable.   DVT prophylaxis: heparin injection 5,000 Units Start: 01/18/24 0600 Code Status: Full code, confirmed with patient on admission Family Communication: Son Sharon Seller by phone (primary contact) and other son at bedside Disposition Plan: Pending clinical progress Consults called: General Surgery Severity of Illness: The appropriate patient status for this patient is INPATIENT. Inpatient status is judged to be reasonable and necessary in order to provide the required intensity of service to ensure the patient's safety. The patient's presenting symptoms, physical exam findings, and initial radiographic and laboratory data in the context of their chronic comorbidities is felt to place them at high risk for further clinical deterioration. Furthermore, it is not anticipated that the patient will be medically stable for discharge from the hospital within 2 midnights of admission.   * I certify that at the point of admission it is my clinical judgment that the patient will require inpatient hospital care spanning beyond 2 midnights from the point of admission due to high intensity of service, high risk for further deterioration and high frequency of surveillance required.Darreld Mclean  MD [facility]  If 7PM-7AM, please contact night-coverage [email or web address]  01/17/2024, 11:26 PM

## 2024-01-17 NOTE — ED Provider Notes (Signed)
  EMERGENCY DEPARTMENT AT [facility] Provider Note   CSN: [id number] Arrival date & time: 01/17/24  1356     History  Chief Complaint  Patient presents with   Abdominal Pain   Nausea    [name] is a 88 y.o. female.  With history of constipation, HTN, T2DM, GERD, anemia requiring iron transfusions, who presents to the ED today for 2.5 days abdominal pain, nausea, vomiting.  Patient has had 3 episodes of emesis prior to arrival.  The first 2 were clear, however the third was pink as patient had just taken Pepto-Bismol.  Pain improved following episodes of vomiting, however is continuing to worsen since arrival and patient becoming more nauseous.  Last bowel movement was today, however has history of constipation and has been drinking prune juice because prior bowel movement was about 2 days before and patient has a history of constipation.  Endorses bowel movement was darker than normal.  Daughter, who patient lives with, endorses that patient has been burping more than her usual.  History of cholecystectomy in the past, however no other abdominal surgeries.   Abdominal Pain      Home Medications Prior to Admission medications   Medication Sig Start Date End Date Taking? Authorizing Provider  acetaminophen (TYLENOL) 325 MG tablet Take 650 mg by mouth every 6 (six) hours as needed for moderate pain or headache.    [provider]  amLODipine (NORVASC) 10 MG tablet Take 0.5 tablets (5 mg total) by mouth daily. 05/24/22   Patwardhan, Anabel Bene, MD  anagrelide (AGRYLIN) 0.5 MG capsule Take 2 capsules (1 mg) by mouth 2 times daily. 06/14/23   Serena Croissant, MD  folic acid (FOLVITE) 1 MG tablet TK 1 T PO QD 04/09/18   [provider]  furosemide (LASIX) 20 MG tablet Take 1 tablet (20 mg total) by mouth as needed. 06/22/23   Patwardhan, Anabel Bene, MD  gabapentin (NEURONTIN) 300 MG capsule Take 300 mg by mouth daily.    [provider]  levothyroxine  (SYNTHROID) 100 MCG tablet Take 1 tablet (100 mcg total) by mouth daily before breakfast. 08/06/14   Reuben Likes, MD  losartan (COZAAR) 50 MG tablet Take 1 tablet (50 mg total) by mouth daily. 08/16/20   Patwardhan, Anabel Bene, MD  meloxicam (MOBIC) 7.5 MG tablet Take 7.5-15 mg by mouth daily. 03/16/20   [provider]  metFORMIN (GLUCOPHAGE) 500 MG tablet Take by mouth daily.    [provider]  metoprolol succinate (TOPROL-XL) 25 MG 24 hr tablet Take 1 tablet (25 mg total) by mouth daily. Patient not taking: Reported on 05/24/2022 08/16/20   Elder Negus, MD  nitroGLYCERIN (NITROSTAT) 0.4 MG SL tablet Place 1 tablet (0.4 mg total) under the tongue every 5 (five) minutes as needed for chest pain. 08/16/20   Patwardhan, Anabel Bene, MD  omeprazole (PRILOSEC) 20 MG capsule TAKE ONE CAPSULE BY MOUTH EVERY DAY 07/22/19   Patwardhan, Manish J, MD  polyethylene glycol (MIRALAX / GLYCOLAX) packet Take 17 g by mouth daily. 07/13/14   Hongalgi, Maximino Greenland, MD  potassium chloride SA (KLOR-CON) 20 MEQ tablet TAKE 1 TABLET(20 MEQ) BY MOUTH DAILY WITH LASIX AND FUROSEMIDE AS NEEDED 08/01/21   Patwardhan, Anabel Bene, MD  primidone (MYSOLINE) 50 MG tablet Take 50 mg by mouth at bedtime. Patient not taking: Reported on 05/24/2022 04/11/18   [provider]  rosuvastatin (CRESTOR) 10 MG tablet Take 1 tablet (10 mg total) by mouth daily.  08/16/20   Patwardhan, Anabel Bene, MD  senna (SENOKOT) 8.6 MG TABS tablet Take 2 tablets (17.2 mg total) by mouth daily as needed for moderate constipation. 07/13/14   Hongalgi, Maximino Greenland, MD  vitamin B-12 (CYANOCOBALAMIN) 1000 MCG tablet Take 1,000 mcg by mouth daily. 07/29/20   [provider]      Allergies    Shellfish allergy    Review of Systems   Review of Systems  Gastrointestinal:  Positive for abdominal pain.    Physical Exam Updated Vital Signs BP (!) 165/83   Pulse 81   Temp 99 F (37.2 C)   Resp 18   Ht 5\' 1"  (1.549 m)   Wt 54.4 kg    SpO2 95%   BMI 22.67 kg/m  Physical Exam Vitals and nursing note reviewed.  Constitutional:      General: She is not in acute distress. HENT:     Head: Normocephalic and atraumatic.     Mouth/Throat:     Lips: Pink. No lesions.     Mouth: Mucous membranes are moist.     Pharynx: Oropharynx is clear. Uvula midline.  Eyes:     Conjunctiva/sclera: Conjunctivae normal.     Pupils: Pupils are equal, round, and reactive to light.  Cardiovascular:     Rate and Rhythm: Normal rate and regular rhythm.     Pulses:          Dorsalis pedis pulses are 2+ on the right side and 2+ on the left side.  Pulmonary:     Effort: Pulmonary effort is normal.     Breath sounds: Normal breath sounds. No decreased breath sounds, wheezing, rhonchi or rales.  Abdominal:     General: There is distension.     Palpations: Abdomen is soft.     Tenderness: There is abdominal tenderness in the right upper quadrant. There is right CVA tenderness.  Musculoskeletal:     Right lower leg: No edema.     Left lower leg: No edema.  Skin:    General: Skin is warm and dry.     Capillary Refill: Capillary refill takes 2 to 3 seconds.  Neurological:     Mental Status: She is alert.  Psychiatric:        Behavior: Behavior is cooperative.     ED Results / Procedures / Treatments   Labs (all labs ordered are listed, but only abnormal results are displayed) Labs Reviewed  CBC WITH DIFFERENTIAL/PLATELET - Abnormal; Notable for the following components:      Result Value   WBC 11.9 (*)    Hemoglobin 9.9 (*)    HCT 31.6 (*)    MCH 25.5 (*)    RDW 18.4 (*)    Platelets 465 (*)    Neutro Abs 9.6 (*)    All other components within normal limits  URINALYSIS, ROUTINE W REFLEX MICROSCOPIC - Abnormal; Notable for the following components:   APPearance HAZY (*)    Protein, ur 30 (*)    Leukocytes,Ua MODERATE (*)    Bacteria, UA RARE (*)    All other components within normal limits  COMPREHENSIVE METABOLIC PANEL -  Abnormal; Notable for the following components:   Potassium 3.2 (*)    Glucose, Bld 120 (*)    Creatinine, Ser 1.07 (*)    Albumin 2.8 (*)    GFR, Estimated 51 (*)    All other components within normal limits  URINE CULTURE  LIPASE, BLOOD  CBC  BASIC METABOLIC PANEL  MAGNESIUM  HEMOGLOBIN A1C    EKG EKG Interpretation Date/Time:  Thursday January 17 2024 14:09:13 EDT Ventricular Rate:  83 PR Interval:  162 QRS Duration:  98 QT Interval:  390 QTC Calculation: 459 R Axis:   9  Text Interpretation: Sinus rhythm Multiform ventricular premature complexes Abnormal R-wave progression, early transition Borderline repolarization abnormality Confirmed by Eber Hong ([id number]) on 01/17/2024 3:45:48 PM  Radiology CT ABDOMEN PELVIS W CONTRAST Result Date: 01/17/2024 CLINICAL DATA:  Abdomen pain EXAM: CT ABDOMEN AND PELVIS WITH CONTRAST TECHNIQUE: Multidetector CT imaging of the abdomen and pelvis was performed using the standard protocol following bolus administration of intravenous contrast. RADIATION DOSE REDUCTION: This exam was performed according to the departmental dose-optimization program which includes automated exposure control, adjustment of the mA and/or kV according to patient size and/or use of iterative reconstruction technique. CONTRAST:  75mL OMNIPAQUE IOHEXOL 350 MG/ML SOLN COMPARISON:  Chest CT 07/12/2014 FINDINGS: Lower chest: Lung bases demonstrate no acute airspace disease. Minimal bronchiectasis and atelectasis or scar at the bases. Cardiomegaly. Small hiatal hernia Hepatobiliary: Cholecystectomy. Intra and extrahepatic biliary dilatation, common bile duct dilated up to 13 mm. Pancreas: Unremarkable. No pancreatic ductal dilatation or surrounding inflammatory changes. Spleen: Subcentimeter hypodense splenic lesions too small to further characterize though some are stable compared with chest CT from 2015, however interval development of indeterminate mass within the anterior spleen  measuring 2.7 cm on series 3, image 10. Vague indeterminate hypodense mass in the inferior spleen measuring 15 mm on series 3, image 19. Adrenals/Urinary Tract: Left adrenal gland is normal. 7 mm hypodensity in the right adrenal gland on series 3, image 16 too small to further characterize, no specific imaging follow-up recommended. Kidneys show no hydronephrosis. Renal cysts for which no imaging follow-up is recommended. Cortical atrophy lower pole left kidney. Generalized hypoenhancement of the lower pole left kidney, delayed series 8, image 18. The urinary bladder is unremarkable. Stomach/Bowel: Stomach nonenlarged. Multiple loops of dilated mid to distal small bowel, measuring up to 3.1 cm. Obstruction is secondary to a large heterogenous ileo cecal/ascending colon mass, this measures approximately 7 by 5.1 by 7.3 cm on series 3, image 31 and coronal series 6, image 59. Colon distal to the mass is completely decompressed. No extraluminal gas to suggest perforation. Vascular/Lymphatic: Aortic atherosclerosis. No aneurysm. Enlarged mesenteric nodes in the vicinity of colon mass, for example 15 mm lymph node on series 3, image 34 and adjacent nodes measuring up to 9 mm on series 3, image 34. Reproductive: Uterus and bilateral adnexa are unremarkable. Other: No free air. Small volume free fluid in the pelvis. Numerous fat containing ventral hernias with some fluid in the hernia sacs. Musculoskeletal: Heterogenous osseous mineralization of the spine and pelvis. No fracture IMPRESSION: 1. Findings consistent with small-bowel obstruction secondary to large ileocecal/ascending colon mass measuring up to 7.3 cm. No extraluminal gas to suggest perforation. Findings concerning for colon malignancy. 2. Enlarged mesenteric nodes in the vicinity of colon mass, suspicious for metastatic adenopathy. 3. Indeterminate splenic masses measuring up to 2.7 cm. When the patient is clinically stable and able to follow directions and  hold their breath (preferably as an outpatient) further evaluation with dedicated abdominal MRI should be considered. 4. Heterogenous osseous mineralization of the spine and pelvis, could be due to osteopenia but marrow disease not excluded 5. Small volume free fluid in the pelvis. 6. Numerous fat containing ventral hernias with some fluid in the hernia sacs. 7. Cholecystectomy with intra and extrahepatic biliary dilatation. Correlation with  LFTs is suggested. 8. Cortical atrophy lower pole left kidney with generalized hypoenhancement of the lower pole left kidney, question pyelonephritis, appearance not typical for infarct 9. Aortic atherosclerosis. Electronically Signed   By: Jasmine Pang M.D.   On: 01/17/2024 21:07    Procedures Procedures    Medications Ordered in ED Medications  ondansetron (ZOFRAN) injection 4 mg (has no administration in time range)  lactated ringers infusion (has no administration in time range)  heparin injection 5,000 Units (has no administration in time range)  potassium chloride 10 mEq in 100 mL IVPB (has no administration in time range)  acetaminophen (TYLENOL) suppository 650 mg (has no administration in time range)  HYDROmorphone (DILAUDID) injection 0.5-1 mg (has no administration in time range)  insulin aspart (novoLOG) injection 0-9 Units (has no administration in time range)  hydrALAZINE (APRESOLINE) injection 10 mg (has no administration in time range)  ondansetron (ZOFRAN) injection 4 mg (4 mg Intravenous Given 01/17/24 1710)  iohexol (OMNIPAQUE) 350 MG/ML injection 75 mL (75 mLs Intravenous Contrast Given 01/17/24 2001)    ED Course/ Medical Decision Making/ A&P   {   Click here for ABCD2, HEART and other calculators                              Medical Decision Making Amount and/or Complexity of Data Reviewed Labs: ordered. Radiology: ordered.  Risk Prescription drug management. Decision regarding hospitalization.   Presents with stable vital  signs.  Physical exam notable for distended abdomen with tenderness to palpation in the upper right quadrant > lower right quadrant and right flank.  She had a bowel movement today, therefore lower concern for complete bowel obstruction, however endorsing recurrent nausea again at this time, despite improvement on prior episodes of emesis.  Initial concern for bowel obstruction versus nephrolithiasis versus pancreatitis versus urinary tract infection/pyelonephritis.  No significant abdominal pain while at rest, however for increasing nausea will administer IV Zofran.  Labs notable for slight leukocytosis WBC 11.9, hemoglobin 9.9, however within patient's baseline of chronic anemia requiring blood transfusions.  No electrolyte or metabolic derangements, appropriate renal and liver function.  Urinalysis appears concerning for infection with positive leukocyte esterase, and rare bacteria.  On my evaluation, CT abdomen and pelvis with concern for small bowel obstruction secondary to mass in the ascending colon.  Patient and son at bedside were notified of these results and concern for malignancy.  NG tube will be placed and general surgery was consulted for management.  Patient requires admission given bowel obstruction with obstructive mass.  Patient will be admitted to the hospitalist service.  Patient was made n.p.o. and initiated on lactated Ringer's infusion at this time.  Final Clinical Impression(s) / ED Diagnoses Final diagnoses:  Small bowel obstruction [facility])    Rx / DC Orders ED Discharge Orders     None      Renella Cunas, PGY-2 Emergency Medicine   Renella Cunas, MD 01/17/24 1191    Eber Hong, MD 01/18/24 [phone]

## 2024-01-18 ENCOUNTER — Other Ambulatory Visit: Payer: Self-pay

## 2024-01-18 ENCOUNTER — Encounter (HOSPITAL_COMMUNITY)
Admission: EM | Disposition: A | Discharge status: Home Health | Payer: Self-pay | Source: Home / Self Care | Attending: Internal Medicine

## 2024-01-18 ENCOUNTER — Inpatient Hospital Stay (HOSPITAL_COMMUNITY)

## 2024-01-18 ENCOUNTER — Encounter (HOSPITAL_COMMUNITY): Payer: Self-pay | Admitting: Internal Medicine

## 2024-01-18 DIAGNOSIS — E039 Hypothyroidism, unspecified: Secondary | ICD-10-CM

## 2024-01-18 DIAGNOSIS — I10 Essential (primary) hypertension: Secondary | ICD-10-CM | POA: Diagnosis not present

## 2024-01-18 DIAGNOSIS — K6389 Other specified diseases of intestine: Secondary | ICD-10-CM

## 2024-01-18 DIAGNOSIS — D500 Iron deficiency anemia secondary to blood loss (chronic): Secondary | ICD-10-CM

## 2024-01-18 DIAGNOSIS — D473 Essential (hemorrhagic) thrombocythemia: Secondary | ICD-10-CM

## 2024-01-18 DIAGNOSIS — N390 Urinary tract infection, site not specified: Secondary | ICD-10-CM

## 2024-01-18 DIAGNOSIS — K56609 Unspecified intestinal obstruction, unspecified as to partial versus complete obstruction: Secondary | ICD-10-CM

## 2024-01-18 DIAGNOSIS — N1831 Chronic kidney disease, stage 3a: Secondary | ICD-10-CM

## 2024-01-18 DIAGNOSIS — E785 Hyperlipidemia, unspecified: Secondary | ICD-10-CM

## 2024-01-18 DIAGNOSIS — E119 Type 2 diabetes mellitus without complications: Secondary | ICD-10-CM

## 2024-01-18 HISTORY — PX: LAPAROTOMY: SHX154

## 2024-01-18 HISTORY — PX: PARTIAL COLECTOMY: SHX5273

## 2024-01-18 LAB — CBC
HCT: 30.4 % — ABNORMAL LOW (ref 36.0–46.0)
Hemoglobin: 9.7 g/dL — ABNORMAL LOW (ref 12.0–15.0)
MCH: 25.5 pg — ABNORMAL LOW (ref 26.0–34.0)
MCHC: 31.9 g/dL (ref 30.0–36.0)
MCV: 79.8 fL — ABNORMAL LOW (ref 80.0–100.0)
Platelets: 478 10*3/uL — ABNORMAL HIGH (ref 150–400)
RBC: 3.81 MIL/uL — ABNORMAL LOW (ref 3.87–5.11)
RDW: 18.5 % — ABNORMAL HIGH (ref 11.5–15.5)
WBC: 10.9 10*3/uL — ABNORMAL HIGH (ref 4.0–10.5)
nRBC: 0 % (ref 0.0–0.2)

## 2024-01-18 LAB — BASIC METABOLIC PANEL
Anion gap: 7 (ref 5–15)
BUN: 19 mg/dL (ref 8–23)
CO2: 25 mmol/L (ref 22–32)
Calcium: 8.8 mg/dL — ABNORMAL LOW (ref 8.9–10.3)
Chloride: 106 mmol/L (ref 98–111)
Creatinine, Ser: 1.24 mg/dL — ABNORMAL HIGH (ref 0.44–1.00)
GFR, Estimated: 42 mL/min — ABNORMAL LOW (ref >60)
Glucose, Bld: 114 mg/dL — ABNORMAL HIGH (ref 70–99)
Potassium: 3.7 mmol/L (ref 3.5–5.1)
Sodium: 138 mmol/L (ref 135–145)

## 2024-01-18 LAB — GLUCOSE, CAPILLARY
Glucose-Capillary: 98 mg/dL (ref 70–99)
Glucose-Capillary: 87 mg/dL (ref 70–99)
Glucose-Capillary: 95 mg/dL (ref 70–99)
Glucose-Capillary: 192 mg/dL — ABNORMAL HIGH (ref 70–99)
Glucose-Capillary: 161 mg/dL — ABNORMAL HIGH (ref 70–99)

## 2024-01-18 LAB — HEMOGLOBIN A1C
Hgb A1c MFr Bld: 5.7 % — ABNORMAL HIGH (ref 4.8–5.6)
Mean Plasma Glucose: 116.89 mg/dL

## 2024-01-18 LAB — ABO/RH: ABO/RH(D): O POS

## 2024-01-18 LAB — MAGNESIUM: Magnesium: 2 mg/dL (ref 1.7–2.4)

## 2024-01-18 SURGERY — COLECTOMY, PARTIAL
Anesthesia: General | Site: Abdomen

## 2024-01-18 MED ORDER — CHLORHEXIDINE GLUCONATE 0.12 % MT SOLN
OROMUCOSAL | Status: AC
  Administered 2024-01-18: 15 mL via OROMUCOSAL
  Filled 2024-01-18: qty 15

## 2024-01-18 MED ORDER — SODIUM CHLORIDE 0.9 % IV SOLN
2.0000 g | INTRAVENOUS | Status: AC
  Administered 2024-01-18: 2 g via INTRAVENOUS
  Filled 2024-01-18 (×2): qty 2

## 2024-01-18 MED ORDER — LABETALOL HCL 5 MG/ML IV SOLN
INTRAVENOUS | Status: AC
  Filled 2024-01-18: qty 4

## 2024-01-18 MED ORDER — LABETALOL HCL 5 MG/ML IV SOLN
INTRAVENOUS | Status: DC | PRN
  Administered 2024-01-18 (×2): 5 mg via INTRAVENOUS

## 2024-01-18 MED ORDER — HYDROMORPHONE HCL 1 MG/ML IJ SOLN
INTRAMUSCULAR | Status: AC
Start: 2024-01-18 — End: ?
  Filled 2024-01-18: qty 0.5

## 2024-01-18 MED ORDER — LACTATED RINGERS IV SOLN: INTRAVENOUS | Status: DC

## 2024-01-18 MED ORDER — OXYCODONE HCL 5 MG PO TABS: 5.0000 mg | ORAL_TABLET | Freq: Once | ORAL | Status: DC | PRN

## 2024-01-18 MED ORDER — HYDROMORPHONE HCL 1 MG/ML IJ SOLN: 0.5000 mg | INTRAMUSCULAR | Status: DC | PRN

## 2024-01-18 MED ORDER — FENTANYL CITRATE (PF) 100 MCG/2ML IJ SOLN
25.0000 ug | INTRAMUSCULAR | Status: DC | PRN
  Administered 2024-01-18: 25 ug via INTRAVENOUS

## 2024-01-18 MED ORDER — PROPOFOL 10 MG/ML IV BOLUS
INTRAVENOUS | Status: AC
  Filled 2024-01-18: qty 20

## 2024-01-18 MED ORDER — EPHEDRINE 5 MG/ML INJ
INTRAVENOUS | Status: AC
  Filled 2024-01-18: qty 5

## 2024-01-18 MED ORDER — HEPARIN SODIUM (PORCINE) 5000 UNIT/ML IJ SOLN
5000.0000 [IU] | Freq: Once | INTRAMUSCULAR | Status: AC
  Administered 2024-01-18: 5000 [IU] via SUBCUTANEOUS
  Filled 2024-01-18: qty 1

## 2024-01-18 MED ORDER — ROCURONIUM BROMIDE 10 MG/ML (PF) SYRINGE
PREFILLED_SYRINGE | INTRAVENOUS | Status: DC | PRN
  Administered 2024-01-18: 10 mg via INTRAVENOUS
  Administered 2024-01-18: 50 mg via INTRAVENOUS

## 2024-01-18 MED ORDER — LIDOCAINE 5 % EX PTCH
1.0000 | MEDICATED_PATCH | CUTANEOUS | Status: DC
  Administered 2024-01-18 – 2024-01-29 (×12): 1 via TRANSDERMAL
  Filled 2024-01-18 (×12): qty 1

## 2024-01-18 MED ORDER — FENTANYL CITRATE (PF) 100 MCG/2ML IJ SOLN
INTRAMUSCULAR | Status: AC
Start: 2024-01-18 — End: 2024-01-18
  Filled 2024-01-18: qty 2

## 2024-01-18 MED ORDER — CHLORHEXIDINE GLUCONATE 0.12 % MT SOLN: 15.0000 mL | Freq: Once | OROMUCOSAL | Status: AC

## 2024-01-18 MED ORDER — ALBUMIN HUMAN 5 % IV SOLN: INTRAVENOUS | Status: DC | PRN

## 2024-01-18 MED ORDER — DEXAMETHASONE SODIUM PHOSPHATE 10 MG/ML IJ SOLN
INTRAMUSCULAR | Status: DC | PRN
  Administered 2024-01-18: 5 mg via INTRAVENOUS

## 2024-01-18 MED ORDER — ONDANSETRON HCL 4 MG/2ML IJ SOLN
INTRAMUSCULAR | Status: AC
  Filled 2024-01-18: qty 2

## 2024-01-18 MED ORDER — SUGAMMADEX SODIUM 200 MG/2ML IV SOLN
INTRAVENOUS | Status: AC
  Filled 2024-01-18: qty 2

## 2024-01-18 MED ORDER — METOPROLOL TARTRATE 5 MG/5ML IV SOLN
5.0000 mg | Freq: Three times a day (TID) | INTRAVENOUS | Status: DC
  Administered 2024-01-18 – 2024-01-22 (×11): 5 mg via INTRAVENOUS
  Filled 2024-01-18 (×11): qty 5

## 2024-01-18 MED ORDER — CEFTRIAXONE SODIUM 2 G IJ SOLR
2.0000 g | INTRAMUSCULAR | Status: AC
  Administered 2024-01-18 – 2024-01-20 (×3): 2 g via INTRAVENOUS
  Filled 2024-01-18 (×3): qty 20

## 2024-01-18 MED ORDER — ONDANSETRON HCL 4 MG/2ML IJ SOLN: 4.0000 mg | Freq: Four times a day (QID) | INTRAMUSCULAR | Status: DC | PRN

## 2024-01-18 MED ORDER — 0.9 % SODIUM CHLORIDE (POUR BTL) OPTIME
TOPICAL | Status: DC | PRN
  Administered 2024-01-18 (×2): 1000 mL

## 2024-01-18 MED ORDER — DEXAMETHASONE SODIUM PHOSPHATE 10 MG/ML IJ SOLN
INTRAMUSCULAR | Status: AC
  Filled 2024-01-18: qty 1

## 2024-01-18 MED ORDER — PHENYLEPHRINE 80 MCG/ML (10ML) SYRINGE FOR IV PUSH (FOR BLOOD PRESSURE SUPPORT)
PREFILLED_SYRINGE | INTRAVENOUS | Status: DC | PRN
  Administered 2024-01-18: 80 ug via INTRAVENOUS

## 2024-01-18 MED ORDER — LACTATED RINGERS IV SOLN: INTRAVENOUS | Status: AC

## 2024-01-18 MED ORDER — EPHEDRINE SULFATE-NACL 50-0.9 MG/10ML-% IV SOSY
PREFILLED_SYRINGE | INTRAVENOUS | Status: DC | PRN
  Administered 2024-01-18 (×2): 5 mg via INTRAVENOUS

## 2024-01-18 MED ORDER — PANTOPRAZOLE SODIUM 40 MG IV SOLR
40.0000 mg | INTRAVENOUS | Status: DC
  Administered 2024-01-19: 40 mg via INTRAVENOUS
  Filled 2024-01-18: qty 10

## 2024-01-18 MED ORDER — FENTANYL CITRATE (PF) 250 MCG/5ML IJ SOLN
INTRAMUSCULAR | Status: DC | PRN
Start: 2024-01-18 — End: 2024-01-18
  Administered 2024-01-18: 50 ug via INTRAVENOUS
  Administered 2024-01-18 (×2): 100 ug via INTRAVENOUS

## 2024-01-18 MED ORDER — ROCURONIUM BROMIDE 10 MG/ML (PF) SYRINGE
PREFILLED_SYRINGE | INTRAVENOUS | Status: AC
  Filled 2024-01-18: qty 10

## 2024-01-18 MED ORDER — STERILE WATER FOR IRRIGATION IR SOLN
Status: DC | PRN
  Administered 2024-01-18: 1000 mL

## 2024-01-18 MED ORDER — PHENYLEPHRINE 80 MCG/ML (10ML) SYRINGE FOR IV PUSH (FOR BLOOD PRESSURE SUPPORT)
PREFILLED_SYRINGE | INTRAVENOUS | Status: AC
Start: 2024-01-18 — End: ?
  Filled 2024-01-18: qty 10

## 2024-01-18 MED ORDER — ORAL CARE MOUTH RINSE: 15.0000 mL | Freq: Once | OROMUCOSAL | Status: AC

## 2024-01-18 MED ORDER — LIDOCAINE 2% (20 MG/ML) 5 ML SYRINGE
INTRAMUSCULAR | Status: AC
Start: 2024-01-18 — End: ?
  Filled 2024-01-18: qty 5

## 2024-01-18 MED ORDER — SUGAMMADEX SODIUM 200 MG/2ML IV SOLN
INTRAVENOUS | Status: DC | PRN
  Administered 2024-01-18: 400 mg via INTRAVENOUS

## 2024-01-18 MED ORDER — CHLORHEXIDINE GLUCONATE CLOTH 2 % EX PADS
6.0000 | MEDICATED_PAD | Freq: Once | CUTANEOUS | Status: AC
  Administered 2024-01-18: 6 via TOPICAL

## 2024-01-18 MED ORDER — ONDANSETRON HCL 4 MG/2ML IJ SOLN
INTRAMUSCULAR | Status: DC | PRN
  Administered 2024-01-18: 4 mg via INTRAVENOUS

## 2024-01-18 MED ORDER — FENTANYL CITRATE (PF) 250 MCG/5ML IJ SOLN
INTRAMUSCULAR | Status: AC
  Filled 2024-01-18: qty 5

## 2024-01-18 MED ORDER — PHENYLEPHRINE HCL-NACL 20-0.9 MG/250ML-% IV SOLN
INTRAVENOUS | Status: DC | PRN
  Administered 2024-01-18: 10 ug/min via INTRAVENOUS

## 2024-01-18 MED ORDER — ACETAMINOPHEN 10 MG/ML IV SOLN
1000.0000 mg | Freq: Four times a day (QID) | INTRAVENOUS | Status: AC
  Administered 2024-01-18 – 2024-01-19 (×4): 1000 mg via INTRAVENOUS
  Filled 2024-01-18 (×4): qty 100

## 2024-01-18 MED ORDER — OXYCODONE HCL 5 MG/5ML PO SOLN: 5.0000 mg | Freq: Once | ORAL | Status: DC | PRN

## 2024-01-18 MED ORDER — PROPOFOL 10 MG/ML IV BOLUS
INTRAVENOUS | Status: DC | PRN
Start: 2024-01-18 — End: 2024-01-18
  Administered 2024-01-18: 120 mg via INTRAVENOUS

## 2024-01-18 MED ORDER — SUCCINYLCHOLINE CHLORIDE 200 MG/10ML IV SOSY
PREFILLED_SYRINGE | INTRAVENOUS | Status: DC | PRN
  Administered 2024-01-18: 100 mg via INTRAVENOUS

## 2024-01-18 MED ORDER — LIDOCAINE 2% (20 MG/ML) 5 ML SYRINGE
INTRAMUSCULAR | Status: DC | PRN
Start: 2024-01-18 — End: 2024-01-18
  Administered 2024-01-18: 60 mg via INTRAVENOUS

## 2024-01-18 MED ORDER — METHOCARBAMOL 1000 MG/10ML IJ SOLN
500.0000 mg | Freq: Four times a day (QID) | INTRAMUSCULAR | Status: DC | PRN
  Administered 2024-01-19 – 2024-01-22 (×2): 500 mg via INTRAVENOUS
  Filled 2024-01-18 (×2): qty 10

## 2024-01-18 MED ORDER — HYDROMORPHONE HCL 1 MG/ML IJ SOLN
0.5000 mg | INTRAMUSCULAR | Status: DC | PRN
  Administered 2024-01-18 – 2024-01-19 (×4): 0.5 mg via INTRAVENOUS
  Filled 2024-01-18 (×5): qty 0.5

## 2024-01-18 SURGICAL SUPPLY — 49 items
BAG COUNTER SPONGE SURGICOUNT (BAG) ×2 IMPLANT
BLADE CLIPPER SURG (BLADE) IMPLANT
CANISTER SUCT 3000ML PPV (MISCELLANEOUS) ×2 IMPLANT
CELLS DAT CNTRL 66122 CELL SVR (MISCELLANEOUS) IMPLANT
COVER SURGICAL LIGHT HANDLE (MISCELLANEOUS) ×4 IMPLANT
DRAPE INCISE 23X17 STRL (DRAPES) IMPLANT
DRAPE INCISE IOBAN 23X17 STRL (DRAPES) ×1 IMPLANT
DRAPE INCISE IOBAN 66X45 STRL (DRAPES) ×2 IMPLANT
DRSG OPSITE POSTOP 3X4 (GAUZE/BANDAGES/DRESSINGS) IMPLANT
DRSG OPSITE POSTOP 4X10 (GAUZE/BANDAGES/DRESSINGS) IMPLANT
DRSG OPSITE POSTOP 4X8 (GAUZE/BANDAGES/DRESSINGS) IMPLANT
ELECT CAUTERY BLADE 6.4 (BLADE) ×2 IMPLANT
ELECT REM PT RETURN 9FT ADLT (ELECTROSURGICAL) ×1 IMPLANT
ELECTRODE REM PT RTRN 9FT ADLT (ELECTROSURGICAL) ×2 IMPLANT
GLOVE BIOGEL PI IND STRL 6 (GLOVE) ×4 IMPLANT
GLOVE BIOGEL PI MICRO STRL 5.5 (GLOVE) ×4 IMPLANT
GOWN STRL REUS W/ TWL LRG LVL3 (GOWN DISPOSABLE) ×8 IMPLANT
HEMOSTAT SNOW SURGICEL 2X4 (HEMOSTASIS) IMPLANT
KIT TURNOVER KIT B (KITS) ×2 IMPLANT
LIGASURE IMPACT 36 18CM CVD LR (INSTRUMENTS) IMPLANT
NS IRRIG 1000ML POUR BTL (IV SOLUTION) ×4 IMPLANT
PACK COLON (CUSTOM PROCEDURE TRAY) ×2 IMPLANT
PAD ARMBOARD POSITIONER FOAM (MISCELLANEOUS) ×2 IMPLANT
PENCIL BUTTON HOLSTER BLD 10FT (ELECTRODE) ×2 IMPLANT
RELOAD PROXIMATE 75MM BLUE (ENDOMECHANICALS) ×2 IMPLANT
RELOAD STAPLE 75 3.8 BLU REG (ENDOMECHANICALS) IMPLANT
RETRACTOR WND ALEXIS 18 MED (MISCELLANEOUS) IMPLANT
RETRACTOR WND ALEXIS 25 LRG (MISCELLANEOUS) IMPLANT
RETRACTOR WOUND ALXS 34CM XLRG (MISCELLANEOUS) IMPLANT
RTRCTR WOUND ALEXIS 18CM MED (MISCELLANEOUS) IMPLANT
RTRCTR WOUND ALEXIS 25CM LRG (MISCELLANEOUS) IMPLANT
RTRCTR WOUND ALEXIS 34CM XLRG (MISCELLANEOUS) ×1 IMPLANT
SPECIMEN JAR X LARGE (MISCELLANEOUS) IMPLANT
SPONGE T-LAP 18X18 ~~LOC~~+RFID (SPONGE) ×4 IMPLANT
STAPLER GUN LINEAR PROX 60 (STAPLE) IMPLANT
STAPLER PROXIMATE 75MM BLUE (STAPLE) IMPLANT
STAPLER SKIN PROX 35W (STAPLE) IMPLANT
STAPLER VISISTAT 35W (STAPLE) IMPLANT
SUT NOVA 1 T20/GS 25DT (SUTURE) IMPLANT
SUT PDS AB 1 TP1 54 (SUTURE) ×4 IMPLANT
SUT PDS AB 1 TP1 96 (SUTURE) IMPLANT
SUT SILK 2 0 SH CR/8 (SUTURE) ×2 IMPLANT
SUT SILK 2 0 TIES 10X30 (SUTURE) IMPLANT
SUT SILK 3 0 SH CR/8 (SUTURE) ×2 IMPLANT
SUT VIC AB 3-0 MH 27 (SUTURE) IMPLANT
SUT VIC AB 3-0 SH 27X BRD (SUTURE) IMPLANT
SUT VIC AB 3-0 SH 8-18 (SUTURE) IMPLANT
TRAY FOLEY MTR SLVR 14FR STAT (SET/KITS/TRAYS/PACK) ×2 IMPLANT
TUBE CONNECTING 12X1/4 (SUCTIONS) ×4 IMPLANT

## 2024-01-18 NOTE — Progress Notes (Signed)
 Called twice to get report on patient for scheduled surgery. No answer. Transport sent for patient.

## 2024-01-18 NOTE — Anesthesia Procedure Notes (Signed)
 Procedure Name: Intubation Date/Time: 01/18/2024 12:08 PM  Performed by: Camillia Herter, CRNAPre-anesthesia Checklist: Patient identified, Emergency Drugs available, Suction available and Patient being monitored Patient Re-evaluated:Patient Re-evaluated prior to induction Oxygen Delivery Method: Circle System Utilized Preoxygenation: Pre-oxygenation with 100% oxygen Induction Type: IV induction, Rapid sequence and Cricoid Pressure applied Laryngoscope Size: Miller and 2 Grade View: Grade I Tube type: Oral Tube size: 7.0 mm Number of attempts: 1 Airway Equipment and Method: Stylet and Oral airway Placement Confirmation: ETT inserted through vocal cords under direct vision, positive ETCO2 and breath sounds checked- equal and bilateral Secured at: 21 cm Tube secured with: Tape Dental Injury: Teeth and Oropharynx as per pre-operative assessment

## 2024-01-18 NOTE — Op Note (Signed)
 Date: 01/18/24  Patient: [name] MRN: [id number]  Preoperative Diagnosis: Obstructive cecal mass Postoperative Diagnosis: Same  Procedure: Open right hemicolectomy with ileocolic anastomosis  Surgeon: Sophronia Simas, MD Assistant: Violeta Gelinas, MD  EBL: 20 mL  Anesthesia: General endotracheal  Specimens: Terminal ileum and right colon  Indications: [name] is an 88 yo female who presented to the ED with worsening abdominal pain, nausea and vomiting. A CT scan showed a large, bulky mass in the cecum with an associated small bowel obstruction. After a discussion of the risks and benefits of surgery, she agreed to proceed with a right hemicolectomy.  Findings: Large mass in the cecum, with no invasion of the retroperitoneal structures. Mesenteric adenopathy. No evidence of metastatic disease within the abdomen.  Procedure details: Informed consent was obtained in the preoperative area prior to the procedure. The patient was brought to the operating room and placed on the table in the supine position. General anesthesia was induced and appropriate lines and drains were placed for intraoperative monitoring. Perioperative antibiotics were administered per SCIP guidelines. The abdomen was prepped and draped in the usual sterile fashion. A pre-procedure timeout was taken verifying patient identity, surgical site and procedure to be performed.  A midline skin incision was made, the subcutaneous tissue was divided with cautery, and the fascia was opened along the linea alba.  There were multiple hernia defects at and adjacent to the midline fascia.  There were adhesions in the right upper quadrant on entry into the abdomen from the patient's prior cholecystectomy.  There was colon adherent to the lower midline, which was carefully taken down sharply.  There were omental adhesions in the upper abdomen which were taken down off the abdominal wall with cautery.  There were no peritoneal nodules and  no liver nodules.  Once the abdominal wall was free, an Alexis wound protector was placed.  The right colon was palpated, and there was a large bulky mass within the cecum corresponding to the preoperative imaging findings.  The colon was freely mobile however with no evidence of tumor invasion into the surrounding tissues.  The ascending colon was mobilized along the line of Toldt using cautery.  The hepatic flexure was taken down using cautery, taking care not to injure the duodenum.  The proximal transverse colon was also mobilized using LigaSure.  A window was made in the transverse mesocolon, distal to the right branch of the middle colic vessels.  The adjacent colon was cleared of epiploic fat using blunt dissection and cautery.  The colon was transected at this point, proximal to the middle colic vessels, using a 75 mm GIA stapler with a blue load.  The terminal ileum was then identified and a mesenteric window was created bluntly approximately 15 cm proximal to the ileocecal valve.  The small bowel was divided with a 75 mm GIA stapler with a blue load.  The mesentery was divided using LigaSure, and the ileocolic pedicle was isolated.  A high ligation of the ileocolic pedicle was performed using a 2-0 silk suture ligature.  The specimen was passed off the field and sent for routine pathology.  The stapled end of the ileum was placed adjacent to the stapled end of the transverse colon in antiperistaltic fashion.  A silk stay suture was placed.  An enterotomy was created on the antimesenteric border of the ileum, and a colotomy was made along the tenia.  A side-to-side ileocolic anastomosis was created using a 75 mm GIA stapler with a blue  load.  The common enterotomy was closed with the TA-60B stapler.  The staple lines appeared hemostatic.  A 3-0 silk suture was placed at the apex of the anastomosis to minimize tension.  The anastomosis was widely patent.  The abdomen was irrigated and appeared hemostatic.   The colon and small bowel were placed back into the abdomen, taking care not to twist the mesentery.  The wound protector was removed, and the fascia was closed at midline using a running looped 1 PDS suture, closing all hernia defects within the suture line.  The skin was closed with staples, and a sterile dressing was applied.  The patient tolerated the procedure well with no apparent complications.  All counts were correct x2 at the end of the procedure. The patient was extubated and taken to PACU in stable condition.  Sophronia Simas, MD 01/18/24 3:29 PM

## 2024-01-18 NOTE — Progress Notes (Signed)
   01/18/24 1612  TOC Brief Assessment  Insurance and Status Reviewed (Medicare A and B)  Patient has primary care physician Yes (Available  Osei-Bonsu, Greggory Stallion, MD)  Home environment has been reviewed from home  Prior level of function: somewhat independent  Prior/Current Home Services No current home services  Social Drivers of Health Review SDOH reviewed no interventions necessary  Readmission risk has been reviewed Yes (15%)  Transition of care needs no transition of care needs at this time   [facility] will continue to follow patient for any additional discharge needs

## 2024-01-18 NOTE — H&P (View-Only) (Signed)
 [name] [birth year]  [id number].    Requesting MD: Renella Cunas Chief Complaint/Reason for Consult: obstructing colon mass  HPI:  88 yo female with 3 days of abdominal discomfort and vomiting presented to the ED. She has had multiple episodes similar to this in the past and it has been progressively worse. She has never had a colonoscopy in the past. She has lost a few pounds this year.  ROS: Review of Systems  Constitutional: Negative.   HENT: Negative.    Eyes: Negative.   Respiratory: Negative.    Cardiovascular: Negative.   Gastrointestinal:  Positive for abdominal pain, nausea and vomiting.  Genitourinary: Negative.   Musculoskeletal: Negative.   Skin: Negative.   Neurological: Negative.   Endo/Heme/Allergies: Negative.   Psychiatric/Behavioral: Negative.      Family History  Problem Relation Age of Onset   Hypertension Mother    Hypertension Father     Past Medical History:  Diagnosis Date   CHF (congestive heart failure) (HCC)    Hypertension    Pre-diabetes    Thrombocytosis    Thyroid disease     Past Surgical History:  Procedure Laterality Date   CHOLECYSTECTOMY      Social History:  reports that she has never smoked. She has never used smokeless tobacco. She reports that she does not drink alcohol and does not use drugs.  Allergies:  Allergies  Allergen Reactions   Shellfish Allergy Hives    Medications Prior to Admission  Medication Sig Dispense Refill   acetaminophen (TYLENOL) 325 MG tablet Take 650 mg by mouth every 6 (six) hours as needed for moderate pain or headache.     amLODipine (NORVASC) 10 MG tablet Take 0.5 tablets (5 mg total) by mouth daily. 1 tablet 0   anagrelide (AGRYLIN) 0.5 MG capsule Take 2 capsules (1 mg) by mouth 2 times daily. 360 capsule 3   folic acid (FOLVITE) 1 MG tablet TK 1 T PO QD  5   furosemide (LASIX) 20 MG tablet Take 1 tablet (20 mg total) by mouth as needed. 90 tablet 3   gabapentin (NEURONTIN) 300 MG  capsule Take 300 mg by mouth daily.     levothyroxine (SYNTHROID) 100 MCG tablet Take 1 tablet (100 mcg total) by mouth daily before breakfast. 30 tablet 3   losartan (COZAAR) 50 MG tablet Take 1 tablet (50 mg total) by mouth daily. 90 tablet 3   meloxicam (MOBIC) 7.5 MG tablet Take 7.5-15 mg by mouth daily.     metFORMIN (GLUCOPHAGE) 500 MG tablet Take by mouth daily.     metoprolol succinate (TOPROL-XL) 25 MG 24 hr tablet Take 1 tablet (25 mg total) by mouth daily. (Patient not taking: Reported on 05/24/2022) 90 tablet 3   nitroGLYCERIN (NITROSTAT) 0.4 MG SL tablet Place 1 tablet (0.4 mg total) under the tongue every 5 (five) minutes as needed for chest pain. 90 tablet 3   omeprazole (PRILOSEC) 20 MG capsule TAKE ONE CAPSULE BY MOUTH EVERY DAY 90 capsule 3   polyethylene glycol (MIRALAX / GLYCOLAX) packet Take 17 g by mouth daily. 30 each 0   potassium chloride SA (KLOR-CON) 20 MEQ tablet TAKE 1 TABLET(20 MEQ) BY MOUTH DAILY WITH LASIX AND FUROSEMIDE AS NEEDED 90 tablet 3   primidone (MYSOLINE) 50 MG tablet Take 50 mg by mouth at bedtime. (Patient not taking: Reported on 05/24/2022)  3   rosuvastatin (CRESTOR) 10 MG tablet Take 1 tablet (10 mg total) by mouth daily. 90 tablet  3   senna (SENOKOT) 8.6 MG TABS tablet Take 2 tablets (17.2 mg total) by mouth daily as needed for moderate constipation. 30 each 0   vitamin B-12 (CYANOCOBALAMIN) 1000 MCG tablet Take 1,000 mcg by mouth daily.      Physical Exam: Blood pressure (!) 159/92, pulse 94, temperature 99 F (37.2 C), temperature source Oral, resp. rate 18, height 5\' 1"  (1.549 m), weight 58.3 kg, SpO2 99%. Gen: NAD Resp: nonlabored CV: RRR Abd: distended, soft, nontender Neuro: AOx4  Results for orders placed or performed during the hospital encounter of 01/17/24 (from the past 48 hours)  CBC with Differential     Status: Abnormal   Collection Time: 01/17/24  5:14 PM  Result Value Ref Range   WBC 11.9 (H) 4.0 - 10.5 K/uL   RBC 3.88 3.87 -  5.11 MIL/uL   Hemoglobin 9.9 (L) 12.0 - 15.0 g/dL   HCT 16.1 (L) 09.6 - 04.5 %   MCV 81.4 80.0 - 100.0 fL   MCH 25.5 (L) 26.0 - 34.0 pg   MCHC 31.3 30.0 - 36.0 g/dL   RDW 40.9 (H) 81.1 - 91.4 %   Platelets 465 (H) 150 - 400 K/uL   nRBC 0.0 0.0 - 0.2 %   Neutrophils Relative % 81 %   Neutro Abs 9.6 (H) 1.7 - 7.7 K/uL   Lymphocytes Relative 11 %   Lymphs Abs 1.3 0.7 - 4.0 K/uL   Monocytes Relative 6 %   Monocytes Absolute 0.7 0.1 - 1.0 K/uL   Eosinophils Relative 1 %   Eosinophils Absolute 0.1 0.0 - 0.5 K/uL   Basophils Relative 0 %   Basophils Absolute 0.0 0.0 - 0.1 K/uL   Immature Granulocytes 1 %   Abs Immature Granulocytes 0.07 0.00 - 0.07 K/uL    Comment: Performed at [facility], [address] [place], [place] [address]  Comprehensive metabolic panel     Status: Abnormal   Collection Time: 01/17/24  6:30 PM  Result Value Ref Range   Sodium 140 135 - 145 mmol/L   Potassium 3.2 (L) 3.5 - 5.1 mmol/L   Chloride 105 98 - 111 mmol/L   CO2 25 22 - 32 mmol/L   Glucose, Bld 120 (H) 70 - 99 mg/dL    Comment: Glucose reference range applies only to samples taken after fasting for at least 8 hours.   BUN 19 8 - 23 mg/dL   Creatinine, Ser 6.21 (H) 0.44 - 1.00 mg/dL   Calcium 8.9 8.9 - 30.8 mg/dL   Total Protein 6.5 6.5 - 8.1 g/dL   Albumin 2.8 (L) 3.5 - 5.0 g/dL   AST 20 15 - 41 U/L   ALT 9 0 - 44 U/L   Alkaline Phosphatase 53 38 - 126 U/L   Total Bilirubin 0.5 0.0 - 1.2 mg/dL   GFR, Estimated 51 (L) >60 mL/min    Comment: (NOTE) Calculated using the CKD-EPI Creatinine Equation (2021)    Anion gap 10 5 - 15    Comment: Performed at [facility], [address], [place], [place] [address]  Lipase, blood     Status: None   Collection Time: 01/17/24  6:30 PM  Result Value Ref Range   Lipase 17 11 - 51 U/L    Comment: Performed at [facility], [address] [place], [place] [address]  Urinalysis, Routine w reflex microscopic -Urine, Clean Catch      Status: Abnormal   Collection Time:  01/17/24  8:10 PM  Result Value Ref Range   Color, Urine YELLOW YELLOW   APPearance HAZY (A) CLEAR   Specific Gravity, Urine 1.028 1.005 - 1.030   pH 5.0 5.0 - 8.0   Glucose, UA NEGATIVE NEGATIVE mg/dL   Hgb urine dipstick NEGATIVE NEGATIVE   Bilirubin Urine NEGATIVE NEGATIVE   Ketones, ur NEGATIVE NEGATIVE mg/dL   Protein, ur 30 (A) NEGATIVE mg/dL   Nitrite NEGATIVE NEGATIVE   Leukocytes,Ua MODERATE (A) NEGATIVE   RBC / HPF 0-5 0 - 5 RBC/hpf   WBC, UA 21-50 0 - 5 WBC/hpf   Bacteria, UA RARE (A) NONE SEEN   Squamous Epithelial / HPF 0-5 0 - 5 /HPF   Mucus PRESENT     Comment: Performed at [facility], [address], [place], [place] [address]  Glucose, capillary     Status: Abnormal   Collection Time: 01/17/24 11:45 PM  Result Value Ref Range   Glucose-Capillary 113 (H) 70 - 99 mg/dL    Comment: Glucose reference range applies only to samples taken after fasting for at least 8 hours.  Glucose, capillary     Status: None   Collection Time: 01/18/24  4:36 AM  Result Value Ref Range   Glucose-Capillary 98 70 - 99 mg/dL    Comment: Glucose reference range applies only to samples taken after fasting for at least 8 hours.  CBC     Status: Abnormal   Collection Time: 01/18/24  4:56 AM  Result Value Ref Range   WBC 10.9 (H) 4.0 - 10.5 K/uL   RBC 3.81 (L) 3.87 - 5.11 MIL/uL   Hemoglobin 9.7 (L) 12.0 - 15.0 g/dL   HCT 60.4 (L) 54.0 - 98.1 %   MCV 79.8 (L) 80.0 - 100.0 fL   MCH 25.5 (L) 26.0 - 34.0 pg   MCHC 31.9 30.0 - 36.0 g/dL   RDW 19.1 (H) 47.8 - 29.5 %   Platelets 478 (H) 150 - 400 K/uL   nRBC 0.0 0.0 - 0.2 %    Comment: Performed at [facility], [address] [place], [place] [address]  Basic metabolic panel     Status: Abnormal   Collection Time: 01/18/24  4:56 AM  Result Value Ref Range   Sodium 138 135 - 145 mmol/L   Potassium 3.7 3.5 - 5.1 mmol/L   Chloride 106 98 - 111 mmol/L   CO2 25 22 - 32 mmol/L   Glucose,  Bld 114 (H) 70 - 99 mg/dL    Comment: Glucose reference range applies only to samples taken after fasting for at least 8 hours.   BUN 19 8 - 23 mg/dL   Creatinine, Ser 8.65 (H) 0.44 - 1.00 mg/dL   Calcium 8.8 (L) 8.9 - 10.3 mg/dL   GFR, Estimated 42 (L) >60 mL/min    Comment: (NOTE) Calculated using the CKD-EPI Creatinine Equation (2021)    Anion gap 7 5 - 15    Comment: Performed at [facility], [address] [place], [place] [address]  Magnesium     Status: None   Collection Time: 01/18/24  4:56 AM  Result Value Ref Range   Magnesium 2.0 1.7 - 2.4 mg/dL    Comment: Performed at [facility], [address] [place], [place] [address]  Hemoglobin A1c     Status: Abnormal   Collection Time: 01/18/24  4:56 AM  Result Value Ref Range   Hgb A1c MFr Bld 5.7 (  H) 4.8 - 5.6 %    Comment: (NOTE) Pre diabetes:          5.7%-6.4%  Diabetes:              >6.4%  Glycemic control for   <7.0% adults with diabetes    Mean Plasma Glucose 116.89 mg/dL    Comment: Performed at [facility], [address] [place], [place] [address]   DG Abd Portable 1 View Result Date: 01/18/2024 CLINICAL DATA:  NG tube placement. EXAM: PORTABLE ABDOMEN - 1 VIEW COMPARISON:  Abdominal CT earlier today FINDINGS: Tip of the enteric tube is below the diaphragm in the stomach, the side port is in the region of the gastroesophageal junction. Advancement of 3-4 cm would place the side-port below the diaphragm. Small-bowel distention on CT is fluid-filled and not well demonstrated by radiograph. There is excreted IV contrast in the renal collecting systems. IMPRESSION: Tip of the enteric tube is below the diaphragm in the stomach, the side port is in the region of the gastroesophageal junction. Advancement of 3-4 cm would place the side-port below the diaphragm. Electronically Signed   By: Narda Rutherford M.D.   On: 01/18/2024 00:01   CT ABDOMEN PELVIS W CONTRAST Result Date: 01/17/2024 CLINICAL  DATA:  Abdomen pain EXAM: CT ABDOMEN AND PELVIS WITH CONTRAST TECHNIQUE: Multidetector CT imaging of the abdomen and pelvis was performed using the standard protocol following bolus administration of intravenous contrast. RADIATION DOSE REDUCTION: This exam was performed according to the departmental dose-optimization program which includes automated exposure control, adjustment of the mA and/or kV according to patient size and/or use of iterative reconstruction technique. CONTRAST:  75mL OMNIPAQUE IOHEXOL 350 MG/ML SOLN COMPARISON:  Chest CT 07/12/2014 FINDINGS: Lower chest: Lung bases demonstrate no acute airspace disease. Minimal bronchiectasis and atelectasis or scar at the bases. Cardiomegaly. Small hiatal hernia Hepatobiliary: Cholecystectomy. Intra and extrahepatic biliary dilatation, common bile duct dilated up to 13 mm. Pancreas: Unremarkable. No pancreatic ductal dilatation or surrounding inflammatory changes. Spleen: Subcentimeter hypodense splenic lesions too small to further characterize though some are stable compared with chest CT from 2015, however interval development of indeterminate mass within the anterior spleen measuring 2.7 cm on series 3, image 10. Vague indeterminate hypodense mass in the inferior spleen measuring 15 mm on series 3, image 19. Adrenals/Urinary Tract: Left adrenal gland is normal. 7 mm hypodensity in the right adrenal gland on series 3, image 16 too small to further characterize, no specific imaging follow-up recommended. Kidneys show no hydronephrosis. Renal cysts for which no imaging follow-up is recommended. Cortical atrophy lower pole left kidney. Generalized hypoenhancement of the lower pole left kidney, delayed series 8, image 18. The urinary bladder is unremarkable. Stomach/Bowel: Stomach nonenlarged. Multiple loops of dilated mid to distal small bowel, measuring up to 3.1 cm. Obstruction is secondary to a large heterogenous ileo cecal/ascending colon mass, this  measures approximately 7 by 5.1 by 7.3 cm on series 3, image 31 and coronal series 6, image 59. Colon distal to the mass is completely decompressed. No extraluminal gas to suggest perforation. Vascular/Lymphatic: Aortic atherosclerosis. No aneurysm. Enlarged mesenteric nodes in the vicinity of colon mass, for example 15 mm lymph node on series 3, image 34 and adjacent nodes measuring up to 9 mm on series 3, image 34. Reproductive: Uterus and bilateral adnexa are unremarkable. Other: No free air. Small volume free fluid in the pelvis. Numerous fat containing ventral hernias with some fluid in the hernia sacs. Musculoskeletal: Heterogenous  osseous mineralization of the spine and pelvis. No fracture IMPRESSION: 1. Findings consistent with small-bowel obstruction secondary to large ileocecal/ascending colon mass measuring up to 7.3 cm. No extraluminal gas to suggest perforation. Findings concerning for colon malignancy. 2. Enlarged mesenteric nodes in the vicinity of colon mass, suspicious for metastatic adenopathy. 3. Indeterminate splenic masses measuring up to 2.7 cm. When the patient is clinically stable and able to follow directions and hold their breath (preferably as an outpatient) further evaluation with dedicated abdominal MRI should be considered. 4. Heterogenous osseous mineralization of the spine and pelvis, could be due to osteopenia but marrow disease not excluded 5. Small volume free fluid in the pelvis. 6. Numerous fat containing ventral hernias with some fluid in the hernia sacs. 7. Cholecystectomy with intra and extrahepatic biliary dilatation. Correlation with LFTs is suggested. 8. Cortical atrophy lower pole left kidney with generalized hypoenhancement of the lower pole left kidney, question pyelonephritis, appearance not typical for infarct 9. Aortic atherosclerosis. Electronically Signed   By: Jasmine Pang M.D.   On: 01/17/2024 21:07    Assessment/Plan 88 yo female with obstructing cecal mass.  She has moderate kidney disease as well. She lives with her daughter but completes her ADLs by herself. I reviewed her CT scan images showing a large mass at the cecum. It is near the second portion of the duodenum but does seem to have a plain between them. There are multiple enlarged lymph nodes in the mesentery concerning for lymph node cancerous invasion. -will discuss with day time team. Patient will likely need a surgery for either resection or diversion. Resection could be difficulty given the size and relation to duodenum and ureter. Decision of ostomy vs anastomosis is also important given her age and multiple comorbidities. Diversion could be an effective surgery to minimize stress for her and control the obstruction in order to get to full staging and treatment quicker. -High likelihood to be an advanced cancer. I briefly discussed the possibility of systemic cancer therapy after completing diagnosis and staging  FEN - NPO, NGT VTE - lovenox ID - periop abx Admit - hospitalist admission  Discussed findings and plan with Daishia and her son  I reviewed last 24 h vitals and pain scores, last 48 h intake and output, last 24 h labs and trends, and last 24 h imaging results.  De Blanch [facility] Surgery 01/18/2024, 6:44 AM Please see Amion for pager number during day hours 7:00am-4:30pm or 7:00am -11:30am on weekends

## 2024-01-18 NOTE — Plan of Care (Signed)
  Problem: Education: Goal: Knowledge of General Education information will improve Description: Including pain rating scale, medication(s)/side effects and non-pharmacologic comfort measures Outcome: Progressing   Problem: Health Behavior/Discharge Planning: Goal: Ability to manage health-related needs will improve Outcome: Progressing   Problem: Clinical Measurements: Goal: Ability to maintain clinical measurements within normal limits will improve Outcome: Progressing Goal: Will remain free from infection Outcome: Progressing Goal: Diagnostic test results will improve Outcome: Progressing Goal: Respiratory complications will improve Outcome: Progressing Goal: Cardiovascular complication will be avoided Outcome: Progressing   Problem: Activity: Goal: Risk for activity intolerance will decrease Outcome: Progressing   Problem: Nutrition: Goal: Adequate nutrition will be maintained Outcome: Progressing   Problem: Coping: Goal: Level of anxiety will decrease Outcome: Progressing   Problem: Elimination: Goal: Will not experience complications related to bowel motility Outcome: Progressing Goal: Will not experience complications related to urinary retention Outcome: Progressing   Problem: Pain Managment: Goal: General experience of comfort will improve and/or be controlled Outcome: Progressing   Problem: Safety: Goal: Ability to remain free from injury will improve Outcome: Progressing   Problem: Skin Integrity: Goal: Risk for impaired skin integrity will decrease Outcome: Progressing   Problem: Education: Goal: Ability to describe self-care measures that may prevent or decrease complications (Diabetes Survival Skills Education) will improve Outcome: Progressing Goal: Individualized Educational Video(s) Outcome: Progressing   Problem: Coping: Goal: Ability to adjust to condition or change in health will improve Outcome: Progressing   Problem: Fluid  Volume: Goal: Ability to maintain a balanced intake and output will improve Outcome: Progressing   Problem: Health Behavior/Discharge Planning: Goal: Ability to identify and utilize available resources and services will improve Outcome: Progressing Goal: Ability to manage health-related needs will improve Outcome: Progressing   Problem: Metabolic: Goal: Ability to maintain appropriate glucose levels will improve Outcome: Progressing   Problem: Nutritional: Goal: Maintenance of adequate nutrition will improve Outcome: Progressing Goal: Progress toward achieving an optimal weight will improve Outcome: Progressing   Problem: Skin Integrity: Goal: Risk for impaired skin integrity will decrease Outcome: Progressing   Problem: Tissue Perfusion: Goal: Adequacy of tissue perfusion will improve Outcome: Progressing   Problem: Health Behavior/Discharge Planning: Goal: Ability to manage health-related needs will improve Outcome: Progressing   Problem: Clinical Measurements: Goal: Ability to maintain clinical measurements within normal limits will improve Outcome: Progressing

## 2024-01-18 NOTE — Anesthesia Preprocedure Evaluation (Signed)
 Anesthesia Evaluation  Patient identified by MRN, date of birth, ID band Patient awake    Reviewed: Allergy & Precautions, H&P , NPO status , Patient's Chart, lab work & pertinent test results  Airway Mallampati: II   Neck ROM: full    Dental   Pulmonary neg pulmonary ROS   breath sounds clear to auscultation       Cardiovascular hypertension,  Rhythm:regular Rate:Normal     Neuro/Psych    GI/Hepatic ,GERD  ,,SBO   Endo/Other  diabetesHypothyroidism    Renal/GU      Musculoskeletal   Abdominal   Peds  Hematology  (+) Blood dyscrasia, anemia Hemoglobin 9.7   Anesthesia Other Findings   Reproductive/Obstetrics                             Anesthesia Physical Anesthesia Plan  ASA: 3  Anesthesia Plan: General   Post-op Pain Management:    Induction: Intravenous, Rapid sequence and Cricoid pressure planned  PONV Risk Score and Plan: 3 and Ondansetron, Dexamethasone and Treatment may vary due to age or medical condition  Airway Management Planned: Oral ETT  Additional Equipment:   Intra-op Plan:   Post-operative Plan: Extubation in OR  Informed Consent: I have reviewed the patients History and Physical, chart, labs and discussed the procedure including the risks, benefits and alternatives for the proposed anesthesia with the patient or authorized representative who has indicated his/her understanding and acceptance.     Dental advisory given  Plan Discussed with: CRNA, Anesthesiologist and Surgeon  Anesthesia Plan Comments:        Anesthesia Quick Evaluation

## 2024-01-18 NOTE — Interval H&P Note (Signed)
 History and Physical Interval Note:  01/18/2024 9:02 AM  [name]  has presented today for surgery, with the diagnosis of small bowel obstruction.  The various methods of treatment have been discussed with the patient and family. After consideration of risks, benefits and other options for treatment, the patient has consented to  Procedure(s): COLECTOMY, PARTIAL (N/A) as a surgical intervention, with possible ileostomy. Patient presents with a large obstructing cecal mass. Abdomen is significantly distended. I recommend open right colectomy, with a possible ileostomy. I also discussed the possibility of a diverting ileostomy if the mass is not able to be safely resected. Patient's son [name] was present via phone for this discussion. I reviewed the risks of infection, anastomotic leak, and ureteral injury. [name] and her son expressed understanding and agree to proceed with surgery. All questions were answered. Will not be able to prep due to obstruction. Surgery scheduled this afternoon. WOC consult placed for preoperative stoma marking.  Fritzi Mandes

## 2024-01-18 NOTE — Transfer of Care (Signed)
 Immediate Anesthesia Transfer of Care Note  Patient: [name]  Procedure(s) Performed: COLECTOMY, PARTIAL (Abdomen) LAPAROTOMY, EXPLORATORY (Abdomen)  Patient Location: PACU  Anesthesia Type:General  Level of Consciousness: awake and alert   Airway & Oxygen Therapy: Patient Spontanous Breathing and Patient connected to face mask oxygen  Post-op Assessment: Report given to RN and Post -op Vital signs reviewed and stable  Post vital signs: Reviewed and stable  Last Vitals:  Vitals Value Taken Time  BP 171/76 01/18/24 1445  Temp 36.8 C 01/18/24 1408  Pulse 62 01/18/24 1448  Resp 17 01/18/24 1448  SpO2 97 % 01/18/24 1448  Vitals shown include unfiled device data.  Last Pain:  Vitals:   01/18/24 1435  TempSrc:   PainSc: Asleep         Complications: No notable events documented.

## 2024-01-18 NOTE — Progress Notes (Signed)
 PROGRESS NOTE    [name]  ONG:[id number] DOB: 11/21/1935 DOA: 01/17/2024 PCP: Jackie Plum, MD    Chief Complaint  Patient presents with   Abdominal Pain   Nausea    Brief Narrative:  [name] is a 88 y.o. 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  General surgery consulted and patient to the OR today 01/18/2024.   Assessment & Plan:   Principal Problem:   Small bowel obstruction (HCC) Active Problems:   Hypothyroidism   Essential hypertension, benign   Essential thrombocytosis (HCC)   Dyslipidemia   Iron deficiency anemia due to chronic blood loss   Colonic mass   Chronic kidney disease, stage 3a (HCC)   Urinary tract infection without hematuria  #1 small bowel obstruction secondary to large ileocecal/ascending colonic mass--suspected malignancy -Patient noted to have presented with a 3-day history of lower abdominal pain associated nausea vomiting abdominal distention progressively poorly worsening.  Patient seen in the ED. -CT abdomen and pelvis done consistent with small bowel obstruction secondary to large ileocecal/ascending colonic mass measuring up to 7.3 cm.  No perforation noted.  Enlarged mesenteric nodes in the vicinity of the colon mass suspicious for metastatic adenopathy.  Indeterminate splenic masses measuring up to 2.7 cm centimeters. -NG tube placed. -Patient seen in consultation by general surgery who assessed patient and recommended open right colectomy with a possible ileostomy which patient is to undergo today 01/18/2024. -Per general surgery.  2.  Iron deficiency anemia -Hemoglobin stable at 9.7. -Follow H&H. -Transfusion threshold hemoglobin < 7.  3.  Hypokalemia -Repleted.  4.  Probable UTI -Urinalysis moderate leukocytes, nitrite  negative, rare bacteria, WBC 21-50. -Urine cultures pending. -Place empirically on IV Rocephin for 3 days.  5.  CKD stage IIIa -Stable.  6.  Well-controlled diabetes mellitus type 2 -Hemoglobin A1c 5.7. -CBG 87 this morning. -Patient currently NPO. -Hold home regimen Glucophage. -Continue every 4 hours CBG as patient currently NPO as well as SSI.  7.  Hypertension -Currently n.p.o. due to small bowel obstruction and unable to take oral intake. -Hold home regimen oral antihypertensive medications. -Lopressor 5 mg IV every 8 hours.  8.  Essential thrombocytosis -Follows with hematology, Dr. [name] -Managed on anagrelide which is currently on hold. -Hematology/oncology notified via epic of admission.  9.  Hypothyroidism -Synthroid on hold as patient currently NPO. -If patient continues to be n.p.o. and not tolerating diet may start IV Synthroid in the next 48 to 72 hours.  9.  Hyperlipidemia -Continue to hold statin and resume on discharge.  10.  Splenic masses -Indeterminate splenic masses measuring up to 2.7 cm noted on CT abdomen and pelvis. -May need a dedicated abdominal MRI for further evaluation once patient is clinically stable.   DVT prophylaxis: Heparin Code Status: Full Family Communication: Updated patient and [name] at bedside. Disposition: TBD  Status is: Inpatient Remains inpatient appropriate because: Severity of illness   Consultants:  General Surgery: Dr. Sheliah Hatch 01/18/2024  Procedures:  CT abdomen and pelvis 01/17/2024 Abdominal films 01/17/2024, 01/18/2024  Antimicrobials:  Anti-infectives (From admission, onward)    Start     Dose/Rate Route Frequency Ordered Stop   01/18/24 0915  cefoTEtan (CEFOTAN) 2 g in sodium chloride 0.9 % 100 mL IVPB        2 g 200 mL/hr over 30 Minutes Intravenous On call to O.R. 01/18/24 4401  01/19/24 0559   01/18/24 0915  [MAR Hold]  cefTRIAXone (ROCEPHIN) 2 g in sodium chloride 0.9 % 100 mL IVPB        (MAR Hold  since Fri 01/18/2024 at 1012.Hold Reason: Transfer to a Procedural area)   2 g 200 mL/hr over 30 Minutes Intravenous Every 24 hours 01/18/24 0819 01/21/24 0914         Subjective: In preop.  NG tube in place.  Denies any chest pain or shortness of breath.  Denies any significant abdominal pain.  Very thankful for the care she is getting.  [name] at bedside.  Objective: Vitals:   01/18/24 0438 01/18/24 0444 01/18/24 0721 01/18/24 1013  BP: (!) 159/92  (!) 152/98 138/87  Pulse: 94  81 69  Resp: 18  16 17   Temp: 99 F (37.2 C)  99.3 F (37.4 C) 98.5 F (36.9 C)  TempSrc: Oral  Oral Oral  SpO2: 99%  96% 94%  Weight:  58.3 kg  57.6 kg  Height:    5\' 1"  (1.549 m)    Intake/Output Summary (Last 24 hours) at 01/18/2024 1112 Last data filed at 01/17/2024 2319 Gross per 24 hour  Intake 0 ml  Output --  Net 0 ml   Filed Weights   01/17/24 1410 01/18/24 0444 01/18/24 1013  Weight: 54.4 kg 58.3 kg 57.6 kg    Examination:  General exam: Appears calm and comfortable.  NG tube in place. Respiratory system: Clear to auscultation anterior lung fields.Marland Kitchen Respiratory effort normal. Cardiovascular system: S1 & S2 heard, RRR. No JVD, murmurs, rubs, gallops or clicks. No pedal edema. Gastrointestinal system: Abdomen is distended, soft, tender to palpation, no bowel sounds noted.  No rebound.  No guarding. Central nervous system: Alert and oriented. No focal neurological deficits. Extremities: Symmetric 5 x 5 power. Skin: No rashes, lesions or ulcers Psychiatry: Judgement and insight appear normal. Mood & affect appropriate.     Data Reviewed: I have personally reviewed following labs and imaging studies  CBC: Recent Labs  Lab 01/17/24 1714 01/18/24 0456  WBC 11.9* 10.9*  NEUTROABS 9.6*  --   HGB 9.9* 9.7*  HCT 31.6* 30.4*  MCV 81.4 79.8*  PLT 465* 478*    Basic Metabolic Panel: Recent Labs  Lab 01/17/24 1830 01/18/24 0456  NA 140 138  K 3.2* 3.7  CL 105 106  CO2 25  25  GLUCOSE 120* 114*  BUN 19 19  CREATININE 1.07* 1.24*  CALCIUM 8.9 8.8*  MG  --  2.0    GFR: Estimated Creatinine Clearance: 26.6 mL/min (A) (by C-G formula based on SCr of 1.24 mg/dL (H)).  Liver Function Tests: Recent Labs  Lab 01/17/24 1830  AST 20  ALT 9  ALKPHOS 53  BILITOT 0.5  PROT 6.5  ALBUMIN 2.8*    CBG: Recent Labs  Lab 01/17/24 2345 01/18/24 0436 01/18/24 0748 01/18/24 0956  GLUCAP 113* 98 87 95     No results found for this or any previous visit (from the past 240 hours).       Radiology Studies: DG Abd Portable 1V Result Date: 01/18/2024 CLINICAL DATA:  Nasogastric tube placement. EXAM: PORTABLE ABDOMEN - 1 VIEW COMPARISON:  Same day. FINDINGS: Distal tip of nasogastric tube has been advanced slightly beyond gastroesophageal junction, although distal side hole remains in expected position of the esophagus. IMPRESSION: Distal tip of nasogastric tube has been advanced slightly beyond gastroesophageal junction. Continued advancement is recommended. Electronically Signed   By: Roque Lias  Jr M.D.   On: 01/18/2024 09:56   DG Abd 1 View Result Date: 01/18/2024 CLINICAL DATA:  Nasogastric tube placement. EXAM: ABDOMEN - 1 VIEW COMPARISON:  January 17, 2024. FINDINGS: Distal tip of nasogastric tube is seen at expected position of gastroesophageal junction. Advancement is recommended. IMPRESSION: Distal tip of nasogastric tube seen in expected position of gastroesophageal junction. Advancement is recommended. Electronically Signed   By: Lupita Raider M.D.   On: 01/18/2024 09:56   DG Abd Portable 1 View Result Date: 01/18/2024 CLINICAL DATA:  NG tube placement. EXAM: PORTABLE ABDOMEN - 1 VIEW COMPARISON:  Abdominal CT earlier today FINDINGS: Tip of the enteric tube is below the diaphragm in the stomach, the side port is in the region of the gastroesophageal junction. Advancement of 3-4 cm would place the side-port below the diaphragm. Small-bowel distention on  CT is fluid-filled and not well demonstrated by radiograph. There is excreted IV contrast in the renal collecting systems. IMPRESSION: Tip of the enteric tube is below the diaphragm in the stomach, the side port is in the region of the gastroesophageal junction. Advancement of 3-4 cm would place the side-port below the diaphragm. Electronically Signed   By: Narda Rutherford M.D.   On: 01/18/2024 00:01   CT ABDOMEN PELVIS W CONTRAST Result Date: 01/17/2024 CLINICAL DATA:  Abdomen pain EXAM: CT ABDOMEN AND PELVIS WITH CONTRAST TECHNIQUE: Multidetector CT imaging of the abdomen and pelvis was performed using the standard protocol following bolus administration of intravenous contrast. RADIATION DOSE REDUCTION: This exam was performed according to the departmental dose-optimization program which includes automated exposure control, adjustment of the mA and/or kV according to patient size and/or use of iterative reconstruction technique. CONTRAST:  75mL OMNIPAQUE IOHEXOL 350 MG/ML SOLN COMPARISON:  Chest CT 07/12/2014 FINDINGS: Lower chest: Lung bases demonstrate no acute airspace disease. Minimal bronchiectasis and atelectasis or scar at the bases. Cardiomegaly. Small hiatal hernia Hepatobiliary: Cholecystectomy. Intra and extrahepatic biliary dilatation, common bile duct dilated up to 13 mm. Pancreas: Unremarkable. No pancreatic ductal dilatation or surrounding inflammatory changes. Spleen: Subcentimeter hypodense splenic lesions too small to further characterize though some are stable compared with chest CT from 2015, however interval development of indeterminate mass within the anterior spleen measuring 2.7 cm on series 3, image 10. Vague indeterminate hypodense mass in the inferior spleen measuring 15 mm on series 3, image 19. Adrenals/Urinary Tract: Left adrenal gland is normal. 7 mm hypodensity in the right adrenal gland on series 3, image 16 too small to further characterize, no specific imaging follow-up  recommended. Kidneys show no hydronephrosis. Renal cysts for which no imaging follow-up is recommended. Cortical atrophy lower pole left kidney. Generalized hypoenhancement of the lower pole left kidney, delayed series 8, image 18. The urinary bladder is unremarkable. Stomach/Bowel: Stomach nonenlarged. Multiple loops of dilated mid to distal small bowel, measuring up to 3.1 cm. Obstruction is secondary to a large heterogenous ileo cecal/ascending colon mass, this measures approximately 7 by 5.1 by 7.3 cm on series 3, image 31 and coronal series 6, image 59. Colon distal to the mass is completely decompressed. No extraluminal gas to suggest perforation. Vascular/Lymphatic: Aortic atherosclerosis. No aneurysm. Enlarged mesenteric nodes in the vicinity of colon mass, for example 15 mm lymph node on series 3, image 34 and adjacent nodes measuring up to 9 mm on series 3, image 34. Reproductive: Uterus and bilateral adnexa are unremarkable. Other: No free air. Small volume free fluid in the pelvis. Numerous fat containing ventral hernias with some  fluid in the hernia sacs. Musculoskeletal: Heterogenous osseous mineralization of the spine and pelvis. No fracture IMPRESSION: 1. Findings consistent with small-bowel obstruction secondary to large ileocecal/ascending colon mass measuring up to 7.3 cm. No extraluminal gas to suggest perforation. Findings concerning for colon malignancy. 2. Enlarged mesenteric nodes in the vicinity of colon mass, suspicious for metastatic adenopathy. 3. Indeterminate splenic masses measuring up to 2.7 cm. When the patient is clinically stable and able to follow directions and hold their breath (preferably as an outpatient) further evaluation with dedicated abdominal MRI should be considered. 4. Heterogenous osseous mineralization of the spine and pelvis, could be due to osteopenia but marrow disease not excluded 5. Small volume free fluid in the pelvis. 6. Numerous fat containing ventral  hernias with some fluid in the hernia sacs. 7. Cholecystectomy with intra and extrahepatic biliary dilatation. Correlation with LFTs is suggested. 8. Cortical atrophy lower pole left kidney with generalized hypoenhancement of the lower pole left kidney, question pyelonephritis, appearance not typical for infarct 9. Aortic atherosclerosis. Electronically Signed   By: Jasmine Pang M.D.   On: 01/17/2024 21:07        Scheduled Meds:  [MAR Hold] heparin  5,000 Units Subcutaneous Q8H   [MAR Hold] insulin aspart  0-9 Units Subcutaneous Q4H   [MAR Hold] metoprolol tartrate  5 mg Intravenous Q8H   [MAR Hold] pantoprazole (PROTONIX) IV  40 mg Intravenous Q24H   Continuous Infusions:  cefoTEtan (CEFOTAN) IV     [MAR Hold] cefTRIAXone (ROCEPHIN)  IV 2 g (01/18/24 0901)   lactated ringers 100 mL/hr at 01/18/24 0902   lactated ringers       LOS: 1 day    Time spent: 35 minutes    Ramiro Harvest, MD [facility]   To contact the attending provider between 7A-7P or the covering provider during after hours 7P-7A, please log into the web site [email or web address] and access using universal [place] password for that web site. If you do not have the password, please call the hospital operator.  01/18/2024, 11:12 AM

## 2024-01-18 NOTE — Consult Note (Signed)
 [name] [birth year]  [id number].    Requesting MD: Renella Cunas Chief Complaint/Reason for Consult: obstructing colon mass  HPI:  88 yo female with 3 days of abdominal discomfort and vomiting presented to the ED. She has had multiple episodes similar to this in the past and it has been progressively worse. She has never had a colonoscopy in the past. She has lost a few pounds this year.  ROS: Review of Systems  Constitutional: Negative.   HENT: Negative.    Eyes: Negative.   Respiratory: Negative.    Cardiovascular: Negative.   Gastrointestinal:  Positive for abdominal pain, nausea and vomiting.  Genitourinary: Negative.   Musculoskeletal: Negative.   Skin: Negative.   Neurological: Negative.   Endo/Heme/Allergies: Negative.   Psychiatric/Behavioral: Negative.      Family History  Problem Relation Age of Onset   Hypertension Mother    Hypertension Father     Past Medical History:  Diagnosis Date   CHF (congestive heart failure) (HCC)    Hypertension    Pre-diabetes    Thrombocytosis    Thyroid disease     Past Surgical History:  Procedure Laterality Date   CHOLECYSTECTOMY      Social History:  reports that she has never smoked. She has never used smokeless tobacco. She reports that she does not drink alcohol and does not use drugs.  Allergies:  Allergies  Allergen Reactions   Shellfish Allergy Hives    Medications Prior to Admission  Medication Sig Dispense Refill   acetaminophen (TYLENOL) 325 MG tablet Take 650 mg by mouth every 6 (six) hours as needed for moderate pain or headache.     amLODipine (NORVASC) 10 MG tablet Take 0.5 tablets (5 mg total) by mouth daily. 1 tablet 0   anagrelide (AGRYLIN) 0.5 MG capsule Take 2 capsules (1 mg) by mouth 2 times daily. 360 capsule 3   folic acid (FOLVITE) 1 MG tablet TK 1 T PO QD  5   furosemide (LASIX) 20 MG tablet Take 1 tablet (20 mg total) by mouth as needed. 90 tablet 3   gabapentin (NEURONTIN) 300 MG  capsule Take 300 mg by mouth daily.     levothyroxine (SYNTHROID) 100 MCG tablet Take 1 tablet (100 mcg total) by mouth daily before breakfast. 30 tablet 3   losartan (COZAAR) 50 MG tablet Take 1 tablet (50 mg total) by mouth daily. 90 tablet 3   meloxicam (MOBIC) 7.5 MG tablet Take 7.5-15 mg by mouth daily.     metFORMIN (GLUCOPHAGE) 500 MG tablet Take by mouth daily.     metoprolol succinate (TOPROL-XL) 25 MG 24 hr tablet Take 1 tablet (25 mg total) by mouth daily. (Patient not taking: Reported on 05/24/2022) 90 tablet 3   nitroGLYCERIN (NITROSTAT) 0.4 MG SL tablet Place 1 tablet (0.4 mg total) under the tongue every 5 (five) minutes as needed for chest pain. 90 tablet 3   omeprazole (PRILOSEC) 20 MG capsule TAKE ONE CAPSULE BY MOUTH EVERY DAY 90 capsule 3   polyethylene glycol (MIRALAX / GLYCOLAX) packet Take 17 g by mouth daily. 30 each 0   potassium chloride SA (KLOR-CON) 20 MEQ tablet TAKE 1 TABLET(20 MEQ) BY MOUTH DAILY WITH LASIX AND FUROSEMIDE AS NEEDED 90 tablet 3   primidone (MYSOLINE) 50 MG tablet Take 50 mg by mouth at bedtime. (Patient not taking: Reported on 05/24/2022)  3   rosuvastatin (CRESTOR) 10 MG tablet Take 1 tablet (10 mg total) by mouth daily. 90 tablet  3   senna (SENOKOT) 8.6 MG TABS tablet Take 2 tablets (17.2 mg total) by mouth daily as needed for moderate constipation. 30 each 0   vitamin B-12 (CYANOCOBALAMIN) 1000 MCG tablet Take 1,000 mcg by mouth daily.      Physical Exam: Blood pressure (!) 159/92, pulse 94, temperature 99 F (37.2 C), temperature source Oral, resp. rate 18, height 5\' 1"  (1.549 m), weight 58.3 kg, SpO2 99%. Gen: NAD Resp: nonlabored CV: RRR Abd: distended, soft, nontender Neuro: AOx4  Results for orders placed or performed during the hospital encounter of 01/17/24 (from the past 48 hours)  CBC with Differential     Status: Abnormal   Collection Time: 01/17/24  5:14 PM  Result Value Ref Range   WBC 11.9 (H) 4.0 - 10.5 K/uL   RBC 3.88 3.87 -  5.11 MIL/uL   Hemoglobin 9.9 (L) 12.0 - 15.0 g/dL   HCT 16.1 (L) 09.6 - 04.5 %   MCV 81.4 80.0 - 100.0 fL   MCH 25.5 (L) 26.0 - 34.0 pg   MCHC 31.3 30.0 - 36.0 g/dL   RDW 40.9 (H) 81.1 - 91.4 %   Platelets 465 (H) 150 - 400 K/uL   nRBC 0.0 0.0 - 0.2 %   Neutrophils Relative % 81 %   Neutro Abs 9.6 (H) 1.7 - 7.7 K/uL   Lymphocytes Relative 11 %   Lymphs Abs 1.3 0.7 - 4.0 K/uL   Monocytes Relative 6 %   Monocytes Absolute 0.7 0.1 - 1.0 K/uL   Eosinophils Relative 1 %   Eosinophils Absolute 0.1 0.0 - 0.5 K/uL   Basophils Relative 0 %   Basophils Absolute 0.0 0.0 - 0.1 K/uL   Immature Granulocytes 1 %   Abs Immature Granulocytes 0.07 0.00 - 0.07 K/uL    Comment: Performed at [facility], [address] [place], [place] [address]  Comprehensive metabolic panel     Status: Abnormal   Collection Time: 01/17/24  6:30 PM  Result Value Ref Range   Sodium 140 135 - 145 mmol/L   Potassium 3.2 (L) 3.5 - 5.1 mmol/L   Chloride 105 98 - 111 mmol/L   CO2 25 22 - 32 mmol/L   Glucose, Bld 120 (H) 70 - 99 mg/dL    Comment: Glucose reference range applies only to samples taken after fasting for at least 8 hours.   BUN 19 8 - 23 mg/dL   Creatinine, Ser 6.21 (H) 0.44 - 1.00 mg/dL   Calcium 8.9 8.9 - 30.8 mg/dL   Total Protein 6.5 6.5 - 8.1 g/dL   Albumin 2.8 (L) 3.5 - 5.0 g/dL   AST 20 15 - 41 U/L   ALT 9 0 - 44 U/L   Alkaline Phosphatase 53 38 - 126 U/L   Total Bilirubin 0.5 0.0 - 1.2 mg/dL   GFR, Estimated 51 (L) >60 mL/min    Comment: (NOTE) Calculated using the CKD-EPI Creatinine Equation (2021)    Anion gap 10 5 - 15    Comment: Performed at [facility], [address], [place], [place] [address]  Lipase, blood     Status: None   Collection Time: 01/17/24  6:30 PM  Result Value Ref Range   Lipase 17 11 - 51 U/L    Comment: Performed at [facility], [address] [place], [place] [address]  Urinalysis, Routine w reflex microscopic -Urine, Clean Catch      Status: Abnormal   Collection Time:  01/17/24  8:10 PM  Result Value Ref Range   Color, Urine YELLOW YELLOW   APPearance HAZY (A) CLEAR   Specific Gravity, Urine 1.028 1.005 - 1.030   pH 5.0 5.0 - 8.0   Glucose, UA NEGATIVE NEGATIVE mg/dL   Hgb urine dipstick NEGATIVE NEGATIVE   Bilirubin Urine NEGATIVE NEGATIVE   Ketones, ur NEGATIVE NEGATIVE mg/dL   Protein, ur 30 (A) NEGATIVE mg/dL   Nitrite NEGATIVE NEGATIVE   Leukocytes,Ua MODERATE (A) NEGATIVE   RBC / HPF 0-5 0 - 5 RBC/hpf   WBC, UA 21-50 0 - 5 WBC/hpf   Bacteria, UA RARE (A) NONE SEEN   Squamous Epithelial / HPF 0-5 0 - 5 /HPF   Mucus PRESENT     Comment: Performed at [facility], [address], [place], [place] [address]  Glucose, capillary     Status: Abnormal   Collection Time: 01/17/24 11:45 PM  Result Value Ref Range   Glucose-Capillary 113 (H) 70 - 99 mg/dL    Comment: Glucose reference range applies only to samples taken after fasting for at least 8 hours.  Glucose, capillary     Status: None   Collection Time: 01/18/24  4:36 AM  Result Value Ref Range   Glucose-Capillary 98 70 - 99 mg/dL    Comment: Glucose reference range applies only to samples taken after fasting for at least 8 hours.  CBC     Status: Abnormal   Collection Time: 01/18/24  4:56 AM  Result Value Ref Range   WBC 10.9 (H) 4.0 - 10.5 K/uL   RBC 3.81 (L) 3.87 - 5.11 MIL/uL   Hemoglobin 9.7 (L) 12.0 - 15.0 g/dL   HCT 60.4 (L) 54.0 - 98.1 %   MCV 79.8 (L) 80.0 - 100.0 fL   MCH 25.5 (L) 26.0 - 34.0 pg   MCHC 31.9 30.0 - 36.0 g/dL   RDW 19.1 (H) 47.8 - 29.5 %   Platelets 478 (H) 150 - 400 K/uL   nRBC 0.0 0.0 - 0.2 %    Comment: Performed at [facility], [address] [place], [place] [address]  Basic metabolic panel     Status: Abnormal   Collection Time: 01/18/24  4:56 AM  Result Value Ref Range   Sodium 138 135 - 145 mmol/L   Potassium 3.7 3.5 - 5.1 mmol/L   Chloride 106 98 - 111 mmol/L   CO2 25 22 - 32 mmol/L   Glucose,  Bld 114 (H) 70 - 99 mg/dL    Comment: Glucose reference range applies only to samples taken after fasting for at least 8 hours.   BUN 19 8 - 23 mg/dL   Creatinine, Ser 8.65 (H) 0.44 - 1.00 mg/dL   Calcium 8.8 (L) 8.9 - 10.3 mg/dL   GFR, Estimated 42 (L) >60 mL/min    Comment: (NOTE) Calculated using the CKD-EPI Creatinine Equation (2021)    Anion gap 7 5 - 15    Comment: Performed at [facility], [address] [place], [place] [address]  Magnesium     Status: None   Collection Time: 01/18/24  4:56 AM  Result Value Ref Range   Magnesium 2.0 1.7 - 2.4 mg/dL    Comment: Performed at [facility], [address] [place], [place] [address]  Hemoglobin A1c     Status: Abnormal   Collection Time: 01/18/24  4:56 AM  Result Value Ref Range   Hgb A1c MFr Bld 5.7 (  H) 4.8 - 5.6 %    Comment: (NOTE) Pre diabetes:          5.7%-6.4%  Diabetes:              >6.4%  Glycemic control for   <7.0% adults with diabetes    Mean Plasma Glucose 116.89 mg/dL    Comment: Performed at [facility], [address] [place], [place] [address]   DG Abd Portable 1 View Result Date: 01/18/2024 CLINICAL DATA:  NG tube placement. EXAM: PORTABLE ABDOMEN - 1 VIEW COMPARISON:  Abdominal CT earlier today FINDINGS: Tip of the enteric tube is below the diaphragm in the stomach, the side port is in the region of the gastroesophageal junction. Advancement of 3-4 cm would place the side-port below the diaphragm. Small-bowel distention on CT is fluid-filled and not well demonstrated by radiograph. There is excreted IV contrast in the renal collecting systems. IMPRESSION: Tip of the enteric tube is below the diaphragm in the stomach, the side port is in the region of the gastroesophageal junction. Advancement of 3-4 cm would place the side-port below the diaphragm. Electronically Signed   By: Narda Rutherford M.D.   On: 01/18/2024 00:01   CT ABDOMEN PELVIS W CONTRAST Result Date: 01/17/2024 CLINICAL  DATA:  Abdomen pain EXAM: CT ABDOMEN AND PELVIS WITH CONTRAST TECHNIQUE: Multidetector CT imaging of the abdomen and pelvis was performed using the standard protocol following bolus administration of intravenous contrast. RADIATION DOSE REDUCTION: This exam was performed according to the departmental dose-optimization program which includes automated exposure control, adjustment of the mA and/or kV according to patient size and/or use of iterative reconstruction technique. CONTRAST:  75mL OMNIPAQUE IOHEXOL 350 MG/ML SOLN COMPARISON:  Chest CT 07/12/2014 FINDINGS: Lower chest: Lung bases demonstrate no acute airspace disease. Minimal bronchiectasis and atelectasis or scar at the bases. Cardiomegaly. Small hiatal hernia Hepatobiliary: Cholecystectomy. Intra and extrahepatic biliary dilatation, common bile duct dilated up to 13 mm. Pancreas: Unremarkable. No pancreatic ductal dilatation or surrounding inflammatory changes. Spleen: Subcentimeter hypodense splenic lesions too small to further characterize though some are stable compared with chest CT from 2015, however interval development of indeterminate mass within the anterior spleen measuring 2.7 cm on series 3, image 10. Vague indeterminate hypodense mass in the inferior spleen measuring 15 mm on series 3, image 19. Adrenals/Urinary Tract: Left adrenal gland is normal. 7 mm hypodensity in the right adrenal gland on series 3, image 16 too small to further characterize, no specific imaging follow-up recommended. Kidneys show no hydronephrosis. Renal cysts for which no imaging follow-up is recommended. Cortical atrophy lower pole left kidney. Generalized hypoenhancement of the lower pole left kidney, delayed series 8, image 18. The urinary bladder is unremarkable. Stomach/Bowel: Stomach nonenlarged. Multiple loops of dilated mid to distal small bowel, measuring up to 3.1 cm. Obstruction is secondary to a large heterogenous ileo cecal/ascending colon mass, this  measures approximately 7 by 5.1 by 7.3 cm on series 3, image 31 and coronal series 6, image 59. Colon distal to the mass is completely decompressed. No extraluminal gas to suggest perforation. Vascular/Lymphatic: Aortic atherosclerosis. No aneurysm. Enlarged mesenteric nodes in the vicinity of colon mass, for example 15 mm lymph node on series 3, image 34 and adjacent nodes measuring up to 9 mm on series 3, image 34. Reproductive: Uterus and bilateral adnexa are unremarkable. Other: No free air. Small volume free fluid in the pelvis. Numerous fat containing ventral hernias with some fluid in the hernia sacs. Musculoskeletal: Heterogenous  osseous mineralization of the spine and pelvis. No fracture IMPRESSION: 1. Findings consistent with small-bowel obstruction secondary to large ileocecal/ascending colon mass measuring up to 7.3 cm. No extraluminal gas to suggest perforation. Findings concerning for colon malignancy. 2. Enlarged mesenteric nodes in the vicinity of colon mass, suspicious for metastatic adenopathy. 3. Indeterminate splenic masses measuring up to 2.7 cm. When the patient is clinically stable and able to follow directions and hold their breath (preferably as an outpatient) further evaluation with dedicated abdominal MRI should be considered. 4. Heterogenous osseous mineralization of the spine and pelvis, could be due to osteopenia but marrow disease not excluded 5. Small volume free fluid in the pelvis. 6. Numerous fat containing ventral hernias with some fluid in the hernia sacs. 7. Cholecystectomy with intra and extrahepatic biliary dilatation. Correlation with LFTs is suggested. 8. Cortical atrophy lower pole left kidney with generalized hypoenhancement of the lower pole left kidney, question pyelonephritis, appearance not typical for infarct 9. Aortic atherosclerosis. Electronically Signed   By: Jasmine Pang M.D.   On: 01/17/2024 21:07    Assessment/Plan 88 yo female with obstructing cecal mass.  She has moderate kidney disease as well. She lives with her daughter but completes her ADLs by herself. I reviewed her CT scan images showing a large mass at the cecum. It is near the second portion of the duodenum but does seem to have a plain between them. There are multiple enlarged lymph nodes in the mesentery concerning for lymph node cancerous invasion. -will discuss with day time team. Patient will likely need a surgery for either resection or diversion. Resection could be difficulty given the size and relation to duodenum and ureter. Decision of ostomy vs anastomosis is also important given her age and multiple comorbidities. Diversion could be an effective surgery to minimize stress for her and control the obstruction in order to get to full staging and treatment quicker. -High likelihood to be an advanced cancer. I briefly discussed the possibility of systemic cancer therapy after completing diagnosis and staging  FEN - NPO, NGT VTE - lovenox ID - periop abx Admit - hospitalist admission  Discussed findings and plan with Daishia and her son  I reviewed last 24 h vitals and pain scores, last 48 h intake and output, last 24 h labs and trends, and last 24 h imaging results.  De Blanch [facility] Surgery 01/18/2024, 6:44 AM Please see Amion for pager number during day hours 7:00am-4:30pm or 7:00am -11:30am on weekends

## 2024-01-18 NOTE — Consult Note (Signed)
 WOC Nurse requested for preoperative stoma site marking.  Discussed surgical procedure and stoma creation with patient and family.  Explained role of the WOC nurse team.  Provided the patient with educational booklet and provided samples of pouching options.  Answered patient and family questions.   Examined patient lying, sitting, and standing in order to place the marking in the patient's visual field, away from any creases or abdominal contour issues and within the rectus muscle.   Attempted to mark below the patient's belt line.  The pt has a significant abdomen distention and a crease on lower quadrant, saggy skin, being difficult the ostomy care applying the ileostomy on the lower quadrant.  Marked for colostomy in the LUQ  9 cm to the left of the umbilicus and 7 cm above/below the umbilicus.  Marked for ileostomy in the RUQ  8 cm to the right of the umbilicus and  7 cm above/below the umbilicus.  Patient's abdomen cleansed with CHG wipes at site markings, allowed to air dry prior to marking.Covered mark with thin film transparent dressing to preserve mark until date of surgery.   WOC Nurse team will follow up with patient after surgery for continue ostomy care and teaching.   Thank-you,  Denyse Amass BSN, RN, [employer], WOC  (Pager: [phone])

## 2024-01-19 ENCOUNTER — Inpatient Hospital Stay (HOSPITAL_COMMUNITY)

## 2024-01-19 DIAGNOSIS — I10 Essential (primary) hypertension: Secondary | ICD-10-CM | POA: Diagnosis not present

## 2024-01-19 DIAGNOSIS — N179 Acute kidney failure, unspecified: Secondary | ICD-10-CM | POA: Insufficient documentation

## 2024-01-19 DIAGNOSIS — N1831 Chronic kidney disease, stage 3a: Secondary | ICD-10-CM | POA: Diagnosis not present

## 2024-01-19 DIAGNOSIS — D473 Essential (hemorrhagic) thrombocythemia: Secondary | ICD-10-CM | POA: Diagnosis not present

## 2024-01-19 DIAGNOSIS — R079 Chest pain, unspecified: Secondary | ICD-10-CM

## 2024-01-19 DIAGNOSIS — K56609 Unspecified intestinal obstruction, unspecified as to partial versus complete obstruction: Secondary | ICD-10-CM | POA: Diagnosis not present

## 2024-01-19 LAB — TROPONIN I (HIGH SENSITIVITY)
Troponin I (High Sensitivity): 11 ng/L (ref <18)
Troponin I (High Sensitivity): 11 ng/L (ref <18)

## 2024-01-19 LAB — URINE CULTURE: Culture: 30000 — AB

## 2024-01-19 LAB — MAGNESIUM: Magnesium: 1.6 mg/dL — ABNORMAL LOW (ref 1.7–2.4)

## 2024-01-19 LAB — CBC
HCT: 28.5 % — ABNORMAL LOW (ref 36.0–46.0)
Hemoglobin: 9 g/dL — ABNORMAL LOW (ref 12.0–15.0)
MCH: 25.6 pg — ABNORMAL LOW (ref 26.0–34.0)
MCHC: 31.6 g/dL (ref 30.0–36.0)
MCV: 81.2 fL (ref 80.0–100.0)
Platelets: 461 10*3/uL — ABNORMAL HIGH (ref 150–400)
RBC: 3.51 MIL/uL — ABNORMAL LOW (ref 3.87–5.11)
RDW: 18.6 % — ABNORMAL HIGH (ref 11.5–15.5)
WBC: 12.5 10*3/uL — ABNORMAL HIGH (ref 4.0–10.5)
nRBC: 0 % (ref 0.0–0.2)

## 2024-01-19 LAB — RENAL FUNCTION PANEL
Albumin: 2.4 g/dL — ABNORMAL LOW (ref 3.5–5.0)
Anion gap: 8 (ref 5–15)
BUN: 19 mg/dL (ref 8–23)
CO2: 25 mmol/L (ref 22–32)
Calcium: 8.1 mg/dL — ABNORMAL LOW (ref 8.9–10.3)
Chloride: 106 mmol/L (ref 98–111)
Creatinine, Ser: 1.69 mg/dL — ABNORMAL HIGH (ref 0.44–1.00)
GFR, Estimated: 29 mL/min — ABNORMAL LOW (ref >60)
Glucose, Bld: 127 mg/dL — ABNORMAL HIGH (ref 70–99)
Phosphorus: 5.4 mg/dL — ABNORMAL HIGH (ref 2.5–4.6)
Potassium: 3.7 mmol/L (ref 3.5–5.1)
Sodium: 139 mmol/L (ref 135–145)

## 2024-01-19 LAB — GLUCOSE, CAPILLARY
Glucose-Capillary: 106 mg/dL — ABNORMAL HIGH (ref 70–99)
Glucose-Capillary: 112 mg/dL — ABNORMAL HIGH (ref 70–99)
Glucose-Capillary: 113 mg/dL — ABNORMAL HIGH (ref 70–99)
Glucose-Capillary: 106 mg/dL — ABNORMAL HIGH (ref 70–99)
Glucose-Capillary: 110 mg/dL — ABNORMAL HIGH (ref 70–99)
Glucose-Capillary: 110 mg/dL — ABNORMAL HIGH (ref 70–99)

## 2024-01-19 MED ORDER — MAGNESIUM SULFATE 2 GM/50ML IV SOLN
2.0000 g | Freq: Once | INTRAVENOUS | Status: AC
  Administered 2024-01-19: 2 g via INTRAVENOUS
  Filled 2024-01-19: qty 50

## 2024-01-19 MED ORDER — DICLOFENAC SODIUM 1 % EX GEL
2.0000 g | Freq: Three times a day (TID) | CUTANEOUS | Status: AC
  Administered 2024-01-19 – 2024-01-21 (×5): 2 g via TOPICAL
  Filled 2024-01-19: qty 100

## 2024-01-19 MED ORDER — SODIUM CHLORIDE 0.9 % IV BOLUS
500.0000 mL | Freq: Once | INTRAVENOUS | Status: AC
  Administered 2024-01-19: 500 mL via INTRAVENOUS

## 2024-01-19 MED ORDER — SIMETHICONE 80 MG PO CHEW
80.0000 mg | CHEWABLE_TABLET | Freq: Four times a day (QID) | ORAL | Status: DC
  Administered 2024-01-19 – 2024-01-30 (×33): 80 mg via ORAL
  Filled 2024-01-19 (×38): qty 1

## 2024-01-19 MED ORDER — HYDROMORPHONE HCL 1 MG/ML IJ SOLN
0.5000 mg | INTRAMUSCULAR | Status: DC | PRN
  Administered 2024-01-19 – 2024-01-29 (×13): 0.5 mg via INTRAVENOUS
  Filled 2024-01-19 (×15): qty 0.5

## 2024-01-19 MED ORDER — PANTOPRAZOLE SODIUM 40 MG IV SOLR
40.0000 mg | Freq: Two times a day (BID) | INTRAVENOUS | Status: DC
  Administered 2024-01-19 – 2024-01-27 (×17): 40 mg via INTRAVENOUS
  Filled 2024-01-19 (×17): qty 10

## 2024-01-19 NOTE — Plan of Care (Signed)
 Problem: Education: Goal: Knowledge of General Education information will improve Description: Including pain rating scale, medication(s)/side effects and non-pharmacologic comfort measures 01/19/2024 0658 by Juanetta Gosling, RN Outcome: Progressing 01/19/2024 0658 by Juanetta Gosling, RN Outcome: Progressing   Problem: Health Behavior/Discharge Planning: Goal: Ability to manage health-related needs will improve 01/19/2024 0658 by Juanetta Gosling, RN Outcome: Progressing 01/19/2024 0658 by Juanetta Gosling, RN Outcome: Progressing   Problem: Clinical Measurements: Goal: Ability to maintain clinical measurements within normal limits will improve 01/19/2024 0658 by Juanetta Gosling, RN Outcome: Progressing 01/19/2024 0658 by Juanetta Gosling, RN Outcome: Progressing Goal: Will remain free from infection 01/19/2024 0658 by Juanetta Gosling, RN Outcome: Progressing 01/19/2024 0658 by Juanetta Gosling, RN Outcome: Progressing Goal: Diagnostic test results will improve 01/19/2024 0658 by Juanetta Gosling, RN Outcome: Progressing 01/19/2024 0658 by Juanetta Gosling, RN Outcome: Progressing Goal: Respiratory complications will improve 01/19/2024 0658 by Juanetta Gosling, RN Outcome: Progressing 01/19/2024 0658 by Juanetta Gosling, RN Outcome: Progressing Goal: Cardiovascular complication will be avoided 01/19/2024 0658 by Juanetta Gosling, RN Outcome: Progressing 01/19/2024 0658 by Juanetta Gosling, RN Outcome: Progressing   Problem: Activity: Goal: Risk for activity intolerance will decrease 01/19/2024 0658 by Juanetta Gosling, RN Outcome: Progressing 01/19/2024 0658 by Juanetta Gosling, RN Outcome: Progressing   Problem: Nutrition: Goal: Adequate nutrition will be maintained 01/19/2024 0658 by Juanetta Gosling, RN Outcome: Progressing 01/19/2024 0658 by Juanetta Gosling, RN Outcome: Progressing   Problem:  Coping: Goal: Level of anxiety will decrease 01/19/2024 0658 by Juanetta Gosling, RN Outcome: Progressing 01/19/2024 0658 by Juanetta Gosling, RN Outcome: Progressing   Problem: Elimination: Goal: Will not experience complications related to bowel motility 01/19/2024 0658 by Juanetta Gosling, RN Outcome: Progressing 01/19/2024 0658 by Juanetta Gosling, RN Outcome: Progressing Goal: Will not experience complications related to urinary retention 01/19/2024 0658 by Juanetta Gosling, RN Outcome: Progressing 01/19/2024 0658 by Juanetta Gosling, RN Outcome: Progressing   Problem: Pain Managment: Goal: General experience of comfort will improve and/or be controlled 01/19/2024 0658 by Juanetta Gosling, RN Outcome: Progressing 01/19/2024 0658 by Juanetta Gosling, RN Outcome: Progressing   Problem: Safety: Goal: Ability to remain free from injury will improve 01/19/2024 0658 by Juanetta Gosling, RN Outcome: Progressing 01/19/2024 0658 by Juanetta Gosling, RN Outcome: Progressing   Problem: Skin Integrity: Goal: Risk for impaired skin integrity will decrease 01/19/2024 0658 by Juanetta Gosling, RN Outcome: Progressing 01/19/2024 0658 by Juanetta Gosling, RN Outcome: Progressing   Problem: Education: Goal: Ability to describe self-care measures that may prevent or decrease complications (Diabetes Survival Skills Education) will improve 01/19/2024 0658 by Juanetta Gosling, RN Outcome: Progressing 01/19/2024 0658 by Juanetta Gosling, RN Outcome: Progressing Goal: Individualized Educational Video(s) 01/19/2024 0658 by Juanetta Gosling, RN Outcome: Progressing 01/19/2024 0658 by Juanetta Gosling, RN Outcome: Progressing   Problem: Coping: Goal: Ability to adjust to condition or change in health will improve 01/19/2024 0658 by Juanetta Gosling, RN Outcome: Progressing 01/19/2024 0658 by Juanetta Gosling, RN Outcome:  Progressing   Problem: Fluid Volume: Goal: Ability to maintain a balanced intake and output will improve 01/19/2024 0658 by Juanetta Gosling, RN Outcome: Progressing 01/19/2024 0658 by Juanetta Gosling, RN Outcome: Progressing   Problem: Health Behavior/Discharge Planning: Goal: Ability to identify and utilize available resources and services will improve Outcome: Progressing Goal: Ability to manage health-related needs will improve Outcome: Progressing  Problem: Metabolic: Goal: Ability to maintain appropriate glucose levels will improve Outcome: Progressing   Problem: Nutritional: Goal: Maintenance of adequate nutrition will improve Outcome: Progressing Goal: Progress toward achieving an optimal weight will improve Outcome: Progressing   Problem: Skin Integrity: Goal: Risk for impaired skin integrity will decrease Outcome: Progressing   Problem: Tissue Perfusion: Goal: Adequacy of tissue perfusion will improve Outcome: Progressing

## 2024-01-19 NOTE — Progress Notes (Signed)
 Assessment & Plan: POD#1 - status post right colectomy  NPO, NG, IVF  Await return of bowel function  Mobilize - OOB, ambulate  IS use  Await pathology results        Darnell Level, MD [facility] practice Office: [phone]        Chief Complaint: Cecal mass  Subjective: Patient in bed, responsive.  Daughter at bedside.  Objective: Vital signs in last 24 hours: Temp:  [97.5 F (36.4 C)-98.5 F (36.9 C)] 98.1 F (36.7 C) (03/15 0728) Pulse Rate:  [60-93] 72 (03/15 0728) Resp:  [13-20] 16 (03/15 0728) BP: (114-183)/(67-91) 114/67 (03/15 0728) SpO2:  [90 %-100 %] 90 % (03/15 0728) Weight:  [57.6 kg] 57.6 kg (03/14 1013) Last BM Date : 01/17/24  Intake/Output from previous day: 03/14 0701 - 03/15 0700 In: 1450 [I.V.:1000; IV Piggyback:450] Out: 650 [Urine:650] Intake/Output this shift: No intake/output data recorded.  Physical Exam: Abdomen - soft, protuberant, minimal tenderness; midline incision dry and intact with staples NG tube bilious output  Lab Results:  Recent Labs    01/18/24 0456 01/19/24 0708  WBC 10.9* 12.5*  HGB 9.7* 9.0*  HCT 30.4* 28.5*  PLT 478* 461*   BMET Recent Labs    01/18/24 0456 01/19/24 0708  NA 138 139  K 3.7 3.7  CL 106 106  CO2 25 25  GLUCOSE 114* 127*  BUN 19 19  CREATININE 1.24* 1.69*  CALCIUM 8.8* 8.1*   PT/INR No results for input(s): "LABPROT", "INR" in the last 72 hours. Comprehensive Metabolic Panel:    Component Value Date/Time   NA 139 01/19/2024 0708   NA 138 01/18/2024 0456   K 3.7 01/19/2024 0708   K 3.7 01/18/2024 0456   CL 106 01/19/2024 0708   CL 106 01/18/2024 0456   CO2 25 01/19/2024 0708   CO2 25 01/18/2024 0456   BUN 19 01/19/2024 0708   BUN 19 01/18/2024 0456   CREATININE 1.69 (H) 01/19/2024 0708   CREATININE 1.24 (H) 01/18/2024 0456   CREATININE 1.26 (H) 11/13/2023 1248   CREATININE 1.39 (H) 09/04/2023 0840   GLUCOSE 127 (H) 01/19/2024 0708   GLUCOSE 114  (H) 01/18/2024 0456   CALCIUM 8.1 (L) 01/19/2024 0708   CALCIUM 8.8 (L) 01/18/2024 0456   AST 20 01/17/2024 1830   AST 16 11/13/2023 1248   AST 17 09/04/2023 0840   ALT 9 01/17/2024 1830   ALT 8 11/13/2023 1248   ALT 11 09/04/2023 0840   ALKPHOS 53 01/17/2024 1830   ALKPHOS 62 11/13/2023 1248   BILITOT 0.5 01/17/2024 1830   BILITOT 0.5 11/13/2023 1248   BILITOT 0.5 09/04/2023 0840   PROT 6.5 01/17/2024 1830   PROT 7.2 11/13/2023 1248   ALBUMIN 2.4 (L) 01/19/2024 0708   ALBUMIN 2.8 (L) 01/17/2024 1830    Studies/Results: DG Abd Portable 1V Result Date: 01/18/2024 CLINICAL DATA:  Nasogastric tube placement. EXAM: PORTABLE ABDOMEN - 1 VIEW COMPARISON:  Same day. FINDINGS: Distal tip of nasogastric tube has been advanced slightly beyond gastroesophageal junction, although distal side hole remains in expected position of the esophagus. IMPRESSION: Distal tip of nasogastric tube has been advanced slightly beyond gastroesophageal junction. Continued advancement is recommended. Electronically Signed   By: Lupita Raider M.D.   On: 01/18/2024 09:56   DG Abd 1 View Result Date: 01/18/2024 CLINICAL DATA:  Nasogastric tube placement. EXAM: ABDOMEN - 1 VIEW COMPARISON:  January 17, 2024. FINDINGS: Distal tip of nasogastric tube is  seen at expected position of gastroesophageal junction. Advancement is recommended. IMPRESSION: Distal tip of nasogastric tube seen in expected position of gastroesophageal junction. Advancement is recommended. Electronically Signed   By: Lupita Raider M.D.   On: 01/18/2024 09:56   DG Abd Portable 1 View Result Date: 01/18/2024 CLINICAL DATA:  NG tube placement. EXAM: PORTABLE ABDOMEN - 1 VIEW COMPARISON:  Abdominal CT earlier today FINDINGS: Tip of the enteric tube is below the diaphragm in the stomach, the side port is in the region of the gastroesophageal junction. Advancement of 3-4 cm would place the side-port below the diaphragm. Small-bowel distention on CT is  fluid-filled and not well demonstrated by radiograph. There is excreted IV contrast in the renal collecting systems. IMPRESSION: Tip of the enteric tube is below the diaphragm in the stomach, the side port is in the region of the gastroesophageal junction. Advancement of 3-4 cm would place the side-port below the diaphragm. Electronically Signed   By: Narda Rutherford M.D.   On: 01/18/2024 00:01   CT ABDOMEN PELVIS W CONTRAST Result Date: 01/17/2024 CLINICAL DATA:  Abdomen pain EXAM: CT ABDOMEN AND PELVIS WITH CONTRAST TECHNIQUE: Multidetector CT imaging of the abdomen and pelvis was performed using the standard protocol following bolus administration of intravenous contrast. RADIATION DOSE REDUCTION: This exam was performed according to the departmental dose-optimization program which includes automated exposure control, adjustment of the mA and/or kV according to patient size and/or use of iterative reconstruction technique. CONTRAST:  75mL OMNIPAQUE IOHEXOL 350 MG/ML SOLN COMPARISON:  Chest CT 07/12/2014 FINDINGS: Lower chest: Lung bases demonstrate no acute airspace disease. Minimal bronchiectasis and atelectasis or scar at the bases. Cardiomegaly. Small hiatal hernia Hepatobiliary: Cholecystectomy. Intra and extrahepatic biliary dilatation, common bile duct dilated up to 13 mm. Pancreas: Unremarkable. No pancreatic ductal dilatation or surrounding inflammatory changes. Spleen: Subcentimeter hypodense splenic lesions too small to further characterize though some are stable compared with chest CT from 2015, however interval development of indeterminate mass within the anterior spleen measuring 2.7 cm on series 3, image 10. Vague indeterminate hypodense mass in the inferior spleen measuring 15 mm on series 3, image 19. Adrenals/Urinary Tract: Left adrenal gland is normal. 7 mm hypodensity in the right adrenal gland on series 3, image 16 too small to further characterize, no specific imaging follow-up  recommended. Kidneys show no hydronephrosis. Renal cysts for which no imaging follow-up is recommended. Cortical atrophy lower pole left kidney. Generalized hypoenhancement of the lower pole left kidney, delayed series 8, image 18. The urinary bladder is unremarkable. Stomach/Bowel: Stomach nonenlarged. Multiple loops of dilated mid to distal small bowel, measuring up to 3.1 cm. Obstruction is secondary to a large heterogenous ileo cecal/ascending colon mass, this measures approximately 7 by 5.1 by 7.3 cm on series 3, image 31 and coronal series 6, image 59. Colon distal to the mass is completely decompressed. No extraluminal gas to suggest perforation. Vascular/Lymphatic: Aortic atherosclerosis. No aneurysm. Enlarged mesenteric nodes in the vicinity of colon mass, for example 15 mm lymph node on series 3, image 34 and adjacent nodes measuring up to 9 mm on series 3, image 34. Reproductive: Uterus and bilateral adnexa are unremarkable. Other: No free air. Small volume free fluid in the pelvis. Numerous fat containing ventral hernias with some fluid in the hernia sacs. Musculoskeletal: Heterogenous osseous mineralization of the spine and pelvis. No fracture IMPRESSION: 1. Findings consistent with small-bowel obstruction secondary to large ileocecal/ascending colon mass measuring up to 7.3 cm. No extraluminal gas to suggest  perforation. Findings concerning for colon malignancy. 2. Enlarged mesenteric nodes in the vicinity of colon mass, suspicious for metastatic adenopathy. 3. Indeterminate splenic masses measuring up to 2.7 cm. When the patient is clinically stable and able to follow directions and hold their breath (preferably as an outpatient) further evaluation with dedicated abdominal MRI should be considered. 4. Heterogenous osseous mineralization of the spine and pelvis, could be due to osteopenia but marrow disease not excluded 5. Small volume free fluid in the pelvis. 6. Numerous fat containing ventral  hernias with some fluid in the hernia sacs. 7. Cholecystectomy with intra and extrahepatic biliary dilatation. Correlation with LFTs is suggested. 8. Cortical atrophy lower pole left kidney with generalized hypoenhancement of the lower pole left kidney, question pyelonephritis, appearance not typical for infarct 9. Aortic atherosclerosis. Electronically Signed   By: Jasmine Pang M.D.   On: 01/17/2024 21:07      Darnell Level 01/19/2024  Patient ID: [name], female   DOB: [birth year], 88 y.o.   MRN: [id number]

## 2024-01-19 NOTE — Progress Notes (Addendum)
 PROGRESS NOTE    [name]  VFI:[id number] DOB: 28-Sep-1936 DOA: 01/17/2024 PCP: Jackie Plum, MD    Chief Complaint  Patient presents with   Abdominal Pain   Nausea    Brief Narrative:  [name] is a 88 y.o. 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  General surgery consulted and patient to the OR today 01/18/2024.   Assessment & Plan:   Principal Problem:   Small bowel obstruction (HCC) Active Problems:   Hypothyroidism   Essential hypertension, benign   Essential thrombocytosis (HCC)   Dyslipidemia   Chest pain   Iron deficiency anemia due to chronic blood loss   Colonic mass   Chronic kidney disease, stage 3a (HCC)   Urinary tract infection without hematuria   Hypomagnesemia   AKI (acute kidney injury) (HCC)  #1 small bowel obstruction secondary to large ileocecal/ascending colonic mass--suspected malignancy -Patient noted to have presented with a 3-day history of lower abdominal pain associated nausea vomiting abdominal distention progressively poorly worsening.  Patient seen in the ED. -CT abdomen and pelvis done consistent with small bowel obstruction secondary to large ileocecal/ascending colonic mass measuring up to 7.3 cm.  No perforation noted.  Enlarged mesenteric nodes in the vicinity of the colon mass suspicious for metastatic adenopathy.  Indeterminate splenic masses measuring up to 2.7 cm centimeters. -NG tube placed. -Patient seen in consultation by general surgery who assessed patient and patient underwent open right hemicolectomy with ileocolic anastomosis on 01/18/2024.   -Currently n.p.o. with bowel rest.   -Mobilize.   -Awaiting bowel function.  -Per general surgery.  2.  Atypical chest pain -Patient noted with complaints of atypical chest pain  localized on the left as well as left upper abdominal pain and nonradiating.  Patient describes the pain as a gas bubble. -Patient with somewhat reproducible chest pain. -Check a EKG, chest x-ray, cycle cardiac enzymes. -Increase IV PPI to twice daily. -Place on Voltaren gel, simethicone.  3.  Iron deficiency anemia -Hemoglobin stable at 9.0. -Follow H&H. -Transfusion threshold hemoglobin < 7.  4.  Hypokalemia/hypomagnesemia -Potassium at 3.7, magnesium at 1.6. -Magnesium sulfate 2 g IV x 1. -Repeat labs in the AM.  5.  Probable UTI -Urinalysis moderate leukocytes, nitrite negative, rare bacteria, WBC 21-50. -Urine cultures pending. -Continue empiric IV Rocephin x 3 days.   6.  AKI on CKD stage IIIa -Likely secondary to prerenal azotemia as patient presented with small bowel obstruction, nausea vomiting and has been n.p.o. since admission.   -Check a urine sodium, urine creatinine, bladder scan.   -IV fluid bolus x 1 and continue IV fluids and monitor urine output.  -If no improvement with renal function the next 24 hours we will check a renal ultrasound.   7.  Well-controlled diabetes mellitus type 2 -Hemoglobin A1c 5.7. -CBG 113 this morning. -Patient currently NPO. -Continue to hold home regimen Glucophage. -Change CBGs to every 6 hours.  8.  Hypertension -Currently n.p.o. due to small bowel obstruction and unable to take oral intake. -Continue to hold home regimen oral antihypertensive medications. -Continue Lopressor 5 mg IV every 8 hours.  9.  Essential thrombocytosis -Follows with hematology, Dr. [name] -Managed on anagrelide which is currently on hold. -Hematology/oncology notified via epic of admission.  10.  Hypothyroidism -Synthroid on hold as patient currently NPO. -If patient continues to be n.p.o. and not tolerating  diet may start IV Synthroid in the next 48 hours.  11.  Hyperlipidemia -Continue to hold statin and resume on discharge.  12.  Splenic  masses -Indeterminate splenic masses measuring up to 2.7 cm noted on CT abdomen and pelvis. -May need a dedicated abdominal MRI for further evaluation once patient is clinically stable.   DVT prophylaxis: Heparin Code Status: Full Family Communication: Updated patient and daughter at bedside. Disposition: TBD  Status is: Inpatient Remains inpatient appropriate because: Severity of illness   Consultants:  General Surgery: Dr. Sheliah Hatch 01/18/2024  Procedures:  CT abdomen and pelvis 01/17/2024 Abdominal films 01/17/2024, 01/18/2024 Open right hemicolectomy with ileocolic anastomosis per general surgery: Dr. Allen/Dr. Violeta Gelinas  Antimicrobials:  Anti-infectives (From admission, onward)    Start     Dose/Rate Route Frequency Ordered Stop   01/18/24 0915  cefoTEtan (CEFOTAN) 2 g in sodium chloride 0.9 % 100 mL IVPB        2 g 200 mL/hr over 30 Minutes Intravenous On call to O.R. 01/18/24 0819 01/18/24 2042   01/18/24 0915  cefTRIAXone (ROCEPHIN) 2 g in sodium chloride 0.9 % 100 mL IVPB        2 g 200 mL/hr over 30 Minutes Intravenous Every 24 hours 01/18/24 0819 01/21/24 0914         Subjective: Laying in bed.  NG tube in place.  Patient denied any chest pain early on in the morning however later on in the afternoon patient with some complaints of localized left substernal chest pain/left upper abdominal pain that she describes as a gas bubble and nonradiating.  No shortness of breath.  Still with complaints of diffuse abdominal pain.  No flatus.  No bowel movement.  Daughter at bedside.  Objective: Vitals:   01/19/24 0407 01/19/24 0728 01/19/24 1400 01/19/24 1538  BP: 126/75 114/67 (!) 141/84 123/67  Pulse: 78 72 88 82  Resp: 19 16 16 16   Temp: 98.3 F (36.8 C) 98.1 F (36.7 C)  98.7 F (37.1 C)  TempSrc: Oral     SpO2: 95% 90% 100% 91%  Weight:      Height:        Intake/Output Summary (Last 24 hours) at 01/19/2024 1623 Last data filed at 01/19/2024 0043 Gross per  24 hour  Intake 200 ml  Output 400 ml  Net -200 ml   Filed Weights   01/17/24 1410 01/18/24 0444 01/18/24 1013  Weight: 54.4 kg 58.3 kg 57.6 kg    Examination:  General exam: Appears calm and comfortable.  NG tube in place. Respiratory system: Clear to auscultation anterior lung fields.Marland Kitchen Respiratory effort normal. Cardiovascular system: RRR no murmurs rubs or gallops.  No JVD.  No lower extremity edema.  Chest wall with some tenderness to palpation. Gastrointestinal system: Abdomen is less distended, soft, diffusely tender to palpation.  Hypoactive bowel sounds.  Honeycomb dressing intact.  No rebound.  No guarding.  Central nervous system: Alert and oriented. No focal neurological deficits. Extremities: Symmetric 5 x 5 power. Skin: No rashes, lesions or ulcers Psychiatry: Judgement and insight appear normal. Mood & affect appropriate.     Data Reviewed: I have personally reviewed following labs and imaging studies  CBC: Recent Labs  Lab 01/17/24 1714 01/18/24 0456 01/19/24 0708  WBC 11.9* 10.9* 12.5*  NEUTROABS 9.6*  --   --   HGB 9.9* 9.7* 9.0*  HCT 31.6* 30.4* 28.5*  MCV 81.4 79.8* 81.2  PLT 465* 478* 461*    Basic Metabolic Panel: Recent  Labs  Lab 01/17/24 1830 01/18/24 0456 01/19/24 0708  NA 140 138 139  K 3.2* 3.7 3.7  CL 105 106 106  CO2 25 25 25   GLUCOSE 120* 114* 127*  BUN 19 19 19   CREATININE 1.07* 1.24* 1.69*  CALCIUM 8.9 8.8* 8.1*  MG  --  2.0 1.6*  PHOS  --   --  5.4*    GFR: Estimated Creatinine Clearance: 19.5 mL/min (A) (by C-G formula based on SCr of 1.69 mg/dL (H)).  Liver Function Tests: Recent Labs  Lab 01/17/24 1830 01/19/24 0708  AST 20  --   ALT 9  --   ALKPHOS 53  --   BILITOT 0.5  --   PROT 6.5  --   ALBUMIN 2.8* 2.4*    CBG: Recent Labs  Lab 01/19/24 0005 01/19/24 0410 01/19/24 0728 01/19/24 1249 01/19/24 1539  GLUCAP 112* 106* 113* 106* 110*     Recent Results (from the past 240 hours)  Urine Culture      Status: Abnormal   Collection Time: 01/17/24  8:10 PM   Specimen: Urine, Clean Catch  Result Value Ref Range Status   Specimen Description URINE, CLEAN CATCH  Final   Special Requests   Final    NONE Performed at [facility], [address] [place], [place] [address]    Culture 30,000 COLONIES/mL KLEBSIELLA PNEUMONIAE (A)  Final   Report Status 01/19/2024 FINAL  Final   Organism ID, Bacteria KLEBSIELLA PNEUMONIAE (A)  Final      Susceptibility   Klebsiella pneumoniae - MIC*    AMPICILLIN RESISTANT Resistant     CEFAZOLIN <=4 SENSITIVE Sensitive     CEFEPIME <=0.12 SENSITIVE Sensitive     CEFTRIAXONE <=0.25 SENSITIVE Sensitive     CIPROFLOXACIN <=0.25 SENSITIVE Sensitive     GENTAMICIN <=1 SENSITIVE Sensitive     IMIPENEM <=0.25 SENSITIVE Sensitive     NITROFURANTOIN 128 RESISTANT Resistant     TRIMETH/SULFA <=20 SENSITIVE Sensitive     AMPICILLIN/SULBACTAM 4 SENSITIVE Sensitive     PIP/TAZO <=4 SENSITIVE Sensitive ug/mL    * 30,000 COLONIES/mL KLEBSIELLA PNEUMONIAE         Radiology Studies: DG Abd Portable 1V Result Date: 01/18/2024 CLINICAL DATA:  Nasogastric tube placement. EXAM: PORTABLE ABDOMEN - 1 VIEW COMPARISON:  Same day. FINDINGS: Distal tip of nasogastric tube has been advanced slightly beyond gastroesophageal junction, although distal side hole remains in expected position of the esophagus. IMPRESSION: Distal tip of nasogastric tube has been advanced slightly beyond gastroesophageal junction. Continued advancement is recommended. Electronically Signed   By: Lupita Raider M.D.   On: 01/18/2024 09:56   DG Abd 1 View Result Date: 01/18/2024 CLINICAL DATA:  Nasogastric tube placement. EXAM: ABDOMEN - 1 VIEW COMPARISON:  January 17, 2024. FINDINGS: Distal tip of nasogastric tube is seen at expected position of gastroesophageal junction. Advancement is recommended. IMPRESSION: Distal tip of nasogastric tube seen in expected position of gastroesophageal junction.  Advancement is recommended. Electronically Signed   By: Lupita Raider M.D.   On: 01/18/2024 09:56   DG Abd Portable 1 View Result Date: 01/18/2024 CLINICAL DATA:  NG tube placement. EXAM: PORTABLE ABDOMEN - 1 VIEW COMPARISON:  Abdominal CT earlier today FINDINGS: Tip of the enteric tube is below the diaphragm in the stomach, the side port is in the region of the gastroesophageal junction. Advancement of 3-4 cm would place the side-port below the diaphragm. Small-bowel distention on CT is fluid-filled and not  well demonstrated by radiograph. There is excreted IV contrast in the renal collecting systems. IMPRESSION: Tip of the enteric tube is below the diaphragm in the stomach, the side port is in the region of the gastroesophageal junction. Advancement of 3-4 cm would place the side-port below the diaphragm. Electronically Signed   By: Narda Rutherford M.D.   On: 01/18/2024 00:01   CT ABDOMEN PELVIS W CONTRAST Result Date: 01/17/2024 CLINICAL DATA:  Abdomen pain EXAM: CT ABDOMEN AND PELVIS WITH CONTRAST TECHNIQUE: Multidetector CT imaging of the abdomen and pelvis was performed using the standard protocol following bolus administration of intravenous contrast. RADIATION DOSE REDUCTION: This exam was performed according to the departmental dose-optimization program which includes automated exposure control, adjustment of the mA and/or kV according to patient size and/or use of iterative reconstruction technique. CONTRAST:  75mL OMNIPAQUE IOHEXOL 350 MG/ML SOLN COMPARISON:  Chest CT 07/12/2014 FINDINGS: Lower chest: Lung bases demonstrate no acute airspace disease. Minimal bronchiectasis and atelectasis or scar at the bases. Cardiomegaly. Small hiatal hernia Hepatobiliary: Cholecystectomy. Intra and extrahepatic biliary dilatation, common bile duct dilated up to 13 mm. Pancreas: Unremarkable. No pancreatic ductal dilatation or surrounding inflammatory changes. Spleen: Subcentimeter hypodense splenic lesions too  small to further characterize though some are stable compared with chest CT from 2015, however interval development of indeterminate mass within the anterior spleen measuring 2.7 cm on series 3, image 10. Vague indeterminate hypodense mass in the inferior spleen measuring 15 mm on series 3, image 19. Adrenals/Urinary Tract: Left adrenal gland is normal. 7 mm hypodensity in the right adrenal gland on series 3, image 16 too small to further characterize, no specific imaging follow-up recommended. Kidneys show no hydronephrosis. Renal cysts for which no imaging follow-up is recommended. Cortical atrophy lower pole left kidney. Generalized hypoenhancement of the lower pole left kidney, delayed series 8, image 18. The urinary bladder is unremarkable. Stomach/Bowel: Stomach nonenlarged. Multiple loops of dilated mid to distal small bowel, measuring up to 3.1 cm. Obstruction is secondary to a large heterogenous ileo cecal/ascending colon mass, this measures approximately 7 by 5.1 by 7.3 cm on series 3, image 31 and coronal series 6, image 59. Colon distal to the mass is completely decompressed. No extraluminal gas to suggest perforation. Vascular/Lymphatic: Aortic atherosclerosis. No aneurysm. Enlarged mesenteric nodes in the vicinity of colon mass, for example 15 mm lymph node on series 3, image 34 and adjacent nodes measuring up to 9 mm on series 3, image 34. Reproductive: Uterus and bilateral adnexa are unremarkable. Other: No free air. Small volume free fluid in the pelvis. Numerous fat containing ventral hernias with some fluid in the hernia sacs. Musculoskeletal: Heterogenous osseous mineralization of the spine and pelvis. No fracture IMPRESSION: 1. Findings consistent with small-bowel obstruction secondary to large ileocecal/ascending colon mass measuring up to 7.3 cm. No extraluminal gas to suggest perforation. Findings concerning for colon malignancy. 2. Enlarged mesenteric nodes in the vicinity of colon mass,  suspicious for metastatic adenopathy. 3. Indeterminate splenic masses measuring up to 2.7 cm. When the patient is clinically stable and able to follow directions and hold their breath (preferably as an outpatient) further evaluation with dedicated abdominal MRI should be considered. 4. Heterogenous osseous mineralization of the spine and pelvis, could be due to osteopenia but marrow disease not excluded 5. Small volume free fluid in the pelvis. 6. Numerous fat containing ventral hernias with some fluid in the hernia sacs. 7. Cholecystectomy with intra and extrahepatic biliary dilatation. Correlation with LFTs is suggested. 8. Cortical  atrophy lower pole left kidney with generalized hypoenhancement of the lower pole left kidney, question pyelonephritis, appearance not typical for infarct 9. Aortic atherosclerosis. Electronically Signed   By: Jasmine Pang M.D.   On: 01/17/2024 21:07        Scheduled Meds:  diclofenac Sodium  2 g Topical TID   heparin  5,000 Units Subcutaneous Q8H   insulin aspart  0-9 Units Subcutaneous Q4H   lidocaine  1 patch Transdermal Q24H   metoprolol tartrate  5 mg Intravenous Q8H   pantoprazole (PROTONIX) IV  40 mg Intravenous Q12H   simethicone  80 mg Oral QID   Continuous Infusions:  cefTRIAXone (ROCEPHIN)  IV 200 mL/hr at 01/19/24 1021   lactated ringers 100 mL/hr at 01/19/24 1500     LOS: 2 days    Time spent: 35 minutes    Ramiro Harvest, MD [facility]   To contact the attending provider between 7A-7P or the covering provider during after hours 7P-7A, please log into the web site [email or web address] and access using universal [place] password for that web site. If you do not have the password, please call the hospital operator.  01/19/2024, 4:23 PM

## 2024-01-19 NOTE — Plan of Care (Signed)

## 2024-01-20 ENCOUNTER — Inpatient Hospital Stay (HOSPITAL_COMMUNITY)

## 2024-01-20 DIAGNOSIS — D473 Essential (hemorrhagic) thrombocythemia: Secondary | ICD-10-CM | POA: Diagnosis not present

## 2024-01-20 DIAGNOSIS — K56609 Unspecified intestinal obstruction, unspecified as to partial versus complete obstruction: Secondary | ICD-10-CM | POA: Diagnosis not present

## 2024-01-20 DIAGNOSIS — N1831 Chronic kidney disease, stage 3a: Secondary | ICD-10-CM | POA: Diagnosis not present

## 2024-01-20 DIAGNOSIS — I10 Essential (primary) hypertension: Secondary | ICD-10-CM | POA: Diagnosis not present

## 2024-01-20 LAB — BASIC METABOLIC PANEL
Anion gap: 12 (ref 5–15)
BUN: 23 mg/dL (ref 8–23)
CO2: 20 mmol/L — ABNORMAL LOW (ref 22–32)
Calcium: 8.5 mg/dL — ABNORMAL LOW (ref 8.9–10.3)
Chloride: 107 mmol/L (ref 98–111)
Creatinine, Ser: 1.67 mg/dL — ABNORMAL HIGH (ref 0.44–1.00)
GFR, Estimated: 30 mL/min — ABNORMAL LOW (ref >60)
Glucose, Bld: 106 mg/dL — ABNORMAL HIGH (ref 70–99)
Potassium: 3.7 mmol/L (ref 3.5–5.1)
Sodium: 139 mmol/L (ref 135–145)

## 2024-01-20 LAB — CBC
HCT: 26.3 % — ABNORMAL LOW (ref 36.0–46.0)
Hemoglobin: 8.1 g/dL — ABNORMAL LOW (ref 12.0–15.0)
MCH: 24.7 pg — ABNORMAL LOW (ref 26.0–34.0)
MCHC: 30.8 g/dL (ref 30.0–36.0)
MCV: 80.2 fL (ref 80.0–100.0)
Platelets: 392 10*3/uL (ref 150–400)
RBC: 3.28 MIL/uL — ABNORMAL LOW (ref 3.87–5.11)
RDW: 18.7 % — ABNORMAL HIGH (ref 11.5–15.5)
WBC: 10.7 10*3/uL — ABNORMAL HIGH (ref 4.0–10.5)
nRBC: 0.2 % (ref 0.0–0.2)

## 2024-01-20 LAB — GLUCOSE, CAPILLARY
Glucose-Capillary: 101 mg/dL — ABNORMAL HIGH (ref 70–99)
Glucose-Capillary: 112 mg/dL — ABNORMAL HIGH (ref 70–99)
Glucose-Capillary: 89 mg/dL (ref 70–99)
Glucose-Capillary: 89 mg/dL (ref 70–99)
Glucose-Capillary: 95 mg/dL (ref 70–99)

## 2024-01-20 LAB — MAGNESIUM: Magnesium: 2.3 mg/dL (ref 1.7–2.4)

## 2024-01-20 LAB — HEMOGLOBIN AND HEMATOCRIT, BLOOD
HCT: 25.5 % — ABNORMAL LOW (ref 36.0–46.0)
Hemoglobin: 7.9 g/dL — ABNORMAL LOW (ref 12.0–15.0)

## 2024-01-20 MED ORDER — INSULIN ASPART 100 UNIT/ML IJ SOLN
0.0000 [IU] | Freq: Four times a day (QID) | INTRAMUSCULAR | Status: DC
  Administered 2024-01-22: 1 [IU] via SUBCUTANEOUS
  Administered 2024-01-23 (×3): 2 [IU] via SUBCUTANEOUS
  Administered 2024-01-23: 1 [IU] via SUBCUTANEOUS
  Administered 2024-01-24 (×2): 2 [IU] via SUBCUTANEOUS
  Administered 2024-01-25: 1 [IU] via SUBCUTANEOUS

## 2024-01-20 MED ORDER — LACTATED RINGERS IV SOLN: INTRAVENOUS | Status: AC

## 2024-01-20 NOTE — Plan of Care (Signed)

## 2024-01-20 NOTE — Progress Notes (Signed)
 Assessment & Plan: POD#2 - status post right colectomy             NPO, NG, IVF             Await return of bowel function             Mobilize - OOB, ambulate - up to chair today, ambulate in halls             IS use             Await pathology results - expected Monday or Tuesday  Son at bedside.  Discussed with Dr. Janee Morn this AM.  CT scan reviewed - abdominal findings as expected for POD#2 from laparotomy.  Patient reassured.        Darnell Level, MD [facility] practice Office: [phone]        Chief Complaint: Cecal mass  Subjective: Patient in bed, more responsive today.  Some pain.  No signs of bowel function.  NG bilious.  Objective: Vital signs in last 24 hours: Temp:  [98.3 F (36.8 C)-99.1 F (37.3 C)] 98.5 F (36.9 C) (03/16 0724) Pulse Rate:  [82-96] 91 (03/16 0724) Resp:  [16-20] 16 (03/16 0724) BP: (123-155)/(67-84) 143/83 (03/16 0724) SpO2:  [88 %-100 %] 90 % (03/16 0724) Last BM Date : 01/17/24  Intake/Output from previous day: 03/15 0701 - 03/16 0700 In: 1768.8 [I.V.:1668.8; IV Piggyback:100] Out: -  Intake/Output this shift: No intake/output data recorded.  Physical Exam: HEENT - sclerae clear, mucous membranes moist Abdomen - mild distension; mild diffuse tenderness; wound dry and intact; NG bilious  Lab Results:  Recent Labs    01/19/24 0708 01/20/24 0415  WBC 12.5* 10.7*  HGB 9.0* 8.1*  HCT 28.5* 26.3*  PLT 461* 392   BMET Recent Labs    01/19/24 0708 01/20/24 0415  NA 139 139  K 3.7 3.7  CL 106 107  CO2 25 20*  GLUCOSE 127* 106*  BUN 19 23  CREATININE 1.69* 1.67*  CALCIUM 8.1* 8.5*   PT/INR No results for input(s): "LABPROT", "INR" in the last 72 hours. Comprehensive Metabolic Panel:    Component Value Date/Time   NA 139 01/20/2024 0415   NA 139 01/19/2024 0708   K 3.7 01/20/2024 0415   K 3.7 01/19/2024 0708   CL 107 01/20/2024 0415   CL 106 01/19/2024 0708   CO2 20 (L) 01/20/2024  0415   CO2 25 01/19/2024 0708   BUN 23 01/20/2024 0415   BUN 19 01/19/2024 0708   CREATININE 1.67 (H) 01/20/2024 0415   CREATININE 1.69 (H) 01/19/2024 0708   CREATININE 1.26 (H) 11/13/2023 1248   CREATININE 1.39 (H) 09/04/2023 0840   GLUCOSE 106 (H) 01/20/2024 0415   GLUCOSE 127 (H) 01/19/2024 0708   CALCIUM 8.5 (L) 01/20/2024 0415   CALCIUM 8.1 (L) 01/19/2024 0708   AST 20 01/17/2024 1830   AST 16 11/13/2023 1248   AST 17 09/04/2023 0840   ALT 9 01/17/2024 1830   ALT 8 11/13/2023 1248   ALT 11 09/04/2023 0840   ALKPHOS 53 01/17/2024 1830   ALKPHOS 62 11/13/2023 1248   BILITOT 0.5 01/17/2024 1830   BILITOT 0.5 11/13/2023 1248   BILITOT 0.5 09/04/2023 0840   PROT 6.5 01/17/2024 1830   PROT 7.2 11/13/2023 1248   ALBUMIN 2.4 (L) 01/19/2024 0708   ALBUMIN 2.8 (L) 01/17/2024 1830    Studies/Results: CT CHEST WO CONTRAST Result Date: 01/20/2024 CLINICAL DATA:  Abnormal chest x-ray EXAM: CT CHEST WITHOUT CONTRAST TECHNIQUE: Multidetector CT imaging of the chest was performed following the standard protocol without IV contrast. RADIATION DOSE REDUCTION: This exam was performed according to the departmental dose-optimization program which includes automated exposure control, adjustment of the mA and/or kV according to patient size and/or use of iterative reconstruction technique. COMPARISON:  None Available. FINDINGS: Cardiovascular: Marked cardiac enlargement. No pericardial effusion. The aorta is elongated with ascending segment measuring up to 3.9 cm and transverse segment measuring up to 3.3 cm. Radiographic abnormality is primarily related to marked enlargement the main pulmonary artery which is 5.2 cm in diameter. Mediastinum/Nodes: Negative for mass or adenopathy. Unremarkable enteric tube traversing the esophagus. Lungs/Pleura: Dependent atelectasis with trace pleural fluid. No interstitial edema. Few pulmonary cysts are seen in the apical lungs. Upper Abdomen: Small volume ascites and  pneumoperitoneum seen in the upper abdomen. An enteric tube at least reaches the stomach. The patient underwent recent colectomy. Musculoskeletal: Osteopenia.  No acute finding IMPRESSION: Radiographic abnormality correlates with marked main pulmonary artery enlargement compatible with pulmonary hypertension. The aorta is also elongated and tortuous. No mass or adenopathy the chest. Dependent atelectasis with small volume pleural fluid. Electronically Signed   By: Tiburcio Pea M.D.   On: 01/20/2024 04:09   DG CHEST PORT 1 VIEW Result Date: 01/19/2024 CLINICAL DATA:  Chest pain EXAM: PORTABLE CHEST 1 VIEW COMPARISON:  04/18/2018, 01/17/2024 FINDINGS: Single frontal view of the chest demonstrates enteric catheter passing below diaphragm, tip and side port projecting over the region of the gastric body. Cardiac silhouette is enlarged. There is increased density in the AP window, which could reflect dilated pulmonary artery, aortic aneurysm, or mediastinal adenopathy. CT chest with IV contrast recommended. No airspace disease, effusion, or pneumothorax. No acute bony abnormalities. Surgical clips midline upper abdomen consistent with recent laparotomy. Diffuse gaseous distention of the bowel may reflect residual obstruction or postoperative ileus. IMPRESSION: 1. Increased density in the AP window, which may reflect dilated pulmonary artery, descending thoracic aortic aneurysm, or lymphadenopathy. Further evaluation with chest CT is recommended. According to the referring physician, there is a history of underlying acute renal insufficiency. An unenhanced chest CT could be performed to identify the etiology the chest x-ray finding, with any further evaluation requiring IV contrast dictated by the findings on unenhanced CT and clinical picture. 2. Stable enlarged cardiac silhouette. 3. No acute airspace disease. 4. Enteric catheter tip projecting over gastric body. Persistent gas distention of the bowel may reflect  residual obstruction or postoperative ileus. Critical Value/emergent results were called by telephone at the time of interpretation on 01/19/2024 at 5:20 pm to provider [facility] , who verbally acknowledged these results. Electronically Signed   By: Sharlet Salina M.D.   On: 01/19/2024 17:29      Darnell Level 01/20/2024  Patient ID: [name], female   DOB: 01-18-1936, 88 y.o.   MRN: [id number]

## 2024-01-20 NOTE — Progress Notes (Signed)
 PROGRESS NOTE    [name]  WUJ:[id number] DOB: 28-Jan-1936 DOA: 01/17/2024 PCP: Jackie Plum, MD    Chief Complaint  Patient presents with   Abdominal Pain   Nausea    Brief Narrative:  [name] is a 88 y.o. 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  General surgery consulted and patient to the OR today 01/18/2024.   Assessment & Plan:   Principal Problem:   Small bowel obstruction (HCC) Active Problems:   Hypothyroidism   Essential hypertension, benign   Essential thrombocytosis (HCC)   Dyslipidemia   Chest pain   Iron deficiency anemia due to chronic blood loss   Colonic mass   Chronic kidney disease, stage 3a (HCC)   Urinary tract infection without hematuria   Hypomagnesemia   AKI (acute kidney injury) (HCC)  #1 small bowel obstruction secondary to large ileocecal/ascending colonic mass--suspected malignancy -Patient noted to have presented with a 3-day history of lower abdominal pain associated nausea vomiting abdominal distention progressively poorly worsening.  Patient seen in the ED. -CT abdomen and pelvis done consistent with small bowel obstruction secondary to large ileocecal/ascending colonic mass measuring up to 7.3 cm.  No perforation noted.  Enlarged mesenteric nodes in the vicinity of the colon mass suspicious for metastatic adenopathy.  Indeterminate splenic masses measuring up to 2.7 cm centimeters. -NG tube placed. -Patient seen in consultation by general surgery who assessed patient and patient underwent open right hemicolectomy with ileocolic anastomosis on 01/18/2024.   -Currently n.p.o. with bowel rest.   -Awaiting bowel function. -Mobilize.  -CT scan reviewed by general surgery and abdominal findings noted on CT scan as expected for postop day #2  for laparotomy per general surgeon. -Per general surgery.  2.  Atypical chest pain -Patient noted with complaints of atypical chest pain localized on the left as well as left upper abdominal pain and nonradiating on 01/19/2024 -Patient describes the pain as a gas bubble. -Patient with somewhat reproducible chest pain. -EKG with no ischemic changes noted.   -Cardiac enzymes negative.   -Chest x-ray with increased density in the AP window which may reflect a dilated pulmonary artery, descending thoracic aortic aneurysm or lymphadenopathy.  CT chest recommended.  Stable enlarged cardiac silhouette.  No acute airspace disease.   -CT chest obtained without contrast with marked main pulmonary artery enlargement compatible with pulmonary hypertension relating to radiographic abnormality.  Aorta elongated and tortuous.  No mass or adenopathy in the chest.  Dependent atelectasis with small volume pleural fluid noted.   -Clinical improvement.   -Continue IV PPI twice daily, Voltaren gel, simethicone.   -Supportive care.  3.  Iron deficiency anemia -Hemoglobin currently at 8.1 from 9.0.   -Likely postop acute blood loss anemia.   -Repeat H&H this afternoon.   -Transfusion threshold hemoglobin < 8. -Follow H&H.  4.  Hypokalemia/hypomagnesemia -Potassium at 3.7, magnesium at 2.3. -Repeat labs in the AM.  5.  Probable UTI -Urinalysis moderate leukocytes, nitrite negative, rare bacteria, WBC 21-50. -Urine cultures with 30,000 colonies of Klebsiella pneumonia. -Continue IV Rocephin and treat for total of 3 days..  6.  AKI on CKD stage IIIa -Likely secondary to prerenal azotemia as patient presented with small bowel obstruction, nausea vomiting and has been n.p.o. since admission.   -Urine sodium, urine creatinine pending.   -Urine output not recorded.   -Patient however states  is having urine output.   -Continue IV fluids and follow.  7.  Well-controlled diabetes mellitus type 2 -Hemoglobin A1c  5.7. -CBG 95 this morning. -Patient currently NPO. -Continue to hold home regimen Glucophage. -Change CBGs to every 6 hours.  8.  Hypertension -Currently n.p.o. due to small bowel obstruction and unable to take oral intake. -Continue to hold home oral antihypertensive medications.  -Continue IV Lopressor 5 mg every 8 hours.   9.  Essential thrombocytosis -Follows with hematology, Dr. [name] -Managed on anagrelide which is currently on hold. -Hematology/oncology notified via epic of admission.  10.  Hypothyroidism -Synthroid on hold as patient currently NPO. -If patient continues to be n.p.o. and not tolerating diet may start IV Synthroid in the next 24 hours.  11.  Hyperlipidemia -Statin on hold and resume on discharge.   12.  Splenic masses -Indeterminate splenic masses measuring up to 2.7 cm noted on CT abdomen and pelvis. -May need a dedicated abdominal MRI for further evaluation once patient is clinically stable.   DVT prophylaxis: Heparin Code Status: Full Family Communication: Updated patient and son at bedside. Disposition: TBD  Status is: Inpatient Remains inpatient appropriate because: Severity of illness   Consultants:  General Surgery: Dr. Sheliah Hatch 01/18/2024  Procedures:  CT abdomen and pelvis 01/17/2024 Abdominal films 01/17/2024, 01/18/2024 Open right hemicolectomy with ileocolic anastomosis per general surgery: Dr. Allen/Dr. Violeta Gelinas CT chest 01/19/2024 Chest x-ray 01/19/2024  Antimicrobials:  Anti-infectives (From admission, onward)    Start     Dose/Rate Route Frequency Ordered Stop   01/18/24 0915  cefoTEtan (CEFOTAN) 2 g in sodium chloride 0.9 % 100 mL IVPB        2 g 200 mL/hr over 30 Minutes Intravenous On call to O.R. 01/18/24 0819 01/18/24 2042   01/18/24 0915  cefTRIAXone (ROCEPHIN) 2 g in sodium chloride 0.9 % 100 mL IVPB        2 g 200 mL/hr over 30 Minutes Intravenous Every 24 hours 01/18/24 0819 01/20/24 1011          Subjective: Lying in bed.  NG tube in place.  Son at bedside.  States improvement with chest pain and upper abdominal pain.  No flatus, no bowel movement.  Son at bedside.  Seen by general surgeon early on.   Objective: Vitals:   01/19/24 2013 01/20/24 0532 01/20/24 0724 01/20/24 1122  BP: (!) 155/84 (!) 150/82 (!) 143/83 (!) 141/78  Pulse: 96 94 91 88  Resp: 20 20 16 16   Temp: 98.3 F (36.8 C) 99.1 F (37.3 C) 98.5 F (36.9 C) (!) 100.7 F (38.2 C)  TempSrc:      SpO2: 90% (!) 88% 90% 94%  Weight:      Height:        Intake/Output Summary (Last 24 hours) at 01/20/2024 1231 Last data filed at 01/20/2024 0407 Gross per 24 hour  Intake 1768.8 ml  Output --  Net 1768.8 ml   Filed Weights   01/17/24 1410 01/18/24 0444 01/18/24 1013  Weight: 54.4 kg 58.3 kg 57.6 kg    Examination:  General exam: Appears calm and comfortable.  NG tube in place. Respiratory system: CTAB.  Anterior lung fields.  No wheezes, no crackles, no rhonchi.  Fair movement.  Speaking in full sentences.  Cardiovascular system: Regular rate and rhythm no murmurs rubs or gallop.  No JVD.  No lower extremity edema.  Chest wall with decreased tenderness to palpation. Gastrointestinal system: Abdomen is soft, less distended, diffusely tender to  palpation.  Hypoactive bowel sounds.  Honeycomb dressing intact.  Central nervous system: Alert and oriented. No focal neurological deficits. Extremities: Symmetric 5 x 5 power. Skin: No rashes, lesions or ulcers Psychiatry: Judgement and insight appear normal. Mood & affect appropriate.     Data Reviewed: I have personally reviewed following labs and imaging studies  CBC: Recent Labs  Lab 01/17/24 1714 01/18/24 0456 01/19/24 0708 01/20/24 0415  WBC 11.9* 10.9* 12.5* 10.7*  NEUTROABS 9.6*  --   --   --   HGB 9.9* 9.7* 9.0* 8.1*  HCT 31.6* 30.4* 28.5* 26.3*  MCV 81.4 79.8* 81.2 80.2  PLT 465* 478* 461* 392    Basic Metabolic Panel: Recent Labs  Lab  01/17/24 1830 01/18/24 0456 01/19/24 0708 01/20/24 0415  NA 140 138 139 139  K 3.2* 3.7 3.7 3.7  CL 105 106 106 107  CO2 25 25 25  20*  GLUCOSE 120* 114* 127* 106*  BUN 19 19 19 23   CREATININE 1.07* 1.24* 1.69* 1.67*  CALCIUM 8.9 8.8* 8.1* 8.5*  MG  --  2.0 1.6* 2.3  PHOS  --   --  5.4*  --     GFR: Estimated Creatinine Clearance: 19.7 mL/min (A) (by C-G formula based on SCr of 1.67 mg/dL (H)).  Liver Function Tests: Recent Labs  Lab 01/17/24 1830 01/19/24 0708  AST 20  --   ALT 9  --   ALKPHOS 53  --   BILITOT 0.5  --   PROT 6.5  --   ALBUMIN 2.8* 2.4*    CBG: Recent Labs  Lab 01/19/24 2014 01/20/24 0011 01/20/24 0534 01/20/24 0816 01/20/24 1121  GLUCAP 110* 112* 95 101* 89     Recent Results (from the past 240 hours)  Urine Culture     Status: Abnormal   Collection Time: 01/17/24  8:10 PM   Specimen: Urine, Clean Catch  Result Value Ref Range Status   Specimen Description URINE, CLEAN CATCH  Final   Special Requests   Final    NONE Performed at [facility], [address] [place], [place] [address]    Culture 30,000 COLONIES/mL KLEBSIELLA PNEUMONIAE (A)  Final   Report Status 01/19/2024 FINAL  Final   Organism ID, Bacteria KLEBSIELLA PNEUMONIAE (A)  Final      Susceptibility   Klebsiella pneumoniae - MIC*    AMPICILLIN RESISTANT Resistant     CEFAZOLIN <=4 SENSITIVE Sensitive     CEFEPIME <=0.12 SENSITIVE Sensitive     CEFTRIAXONE <=0.25 SENSITIVE Sensitive     CIPROFLOXACIN <=0.25 SENSITIVE Sensitive     GENTAMICIN <=1 SENSITIVE Sensitive     IMIPENEM <=0.25 SENSITIVE Sensitive     NITROFURANTOIN 128 RESISTANT Resistant     TRIMETH/SULFA <=20 SENSITIVE Sensitive     AMPICILLIN/SULBACTAM 4 SENSITIVE Sensitive     PIP/TAZO <=4 SENSITIVE Sensitive ug/mL    * 30,000 COLONIES/mL KLEBSIELLA PNEUMONIAE         Radiology Studies: CT CHEST WO CONTRAST Result Date: 01/20/2024 CLINICAL DATA:  Abnormal chest x-ray EXAM: CT CHEST WITHOUT  CONTRAST TECHNIQUE: Multidetector CT imaging of the chest was performed following the standard protocol without IV contrast. RADIATION DOSE REDUCTION: This exam was performed according to the departmental dose-optimization program which includes automated exposure control, adjustment of the mA and/or kV according to patient size and/or use of iterative reconstruction technique. COMPARISON:  None Available. FINDINGS: Cardiovascular: Marked cardiac enlargement. No pericardial effusion. The aorta is elongated with ascending segment measuring up  to 3.9 cm and transverse segment measuring up to 3.3 cm. Radiographic abnormality is primarily related to marked enlargement the main pulmonary artery which is 5.2 cm in diameter. Mediastinum/Nodes: Negative for mass or adenopathy. Unremarkable enteric tube traversing the esophagus. Lungs/Pleura: Dependent atelectasis with trace pleural fluid. No interstitial edema. Few pulmonary cysts are seen in the apical lungs. Upper Abdomen: Small volume ascites and pneumoperitoneum seen in the upper abdomen. An enteric tube at least reaches the stomach. The patient underwent recent colectomy. Musculoskeletal: Osteopenia.  No acute finding IMPRESSION: Radiographic abnormality correlates with marked main pulmonary artery enlargement compatible with pulmonary hypertension. The aorta is also elongated and tortuous. No mass or adenopathy the chest. Dependent atelectasis with small volume pleural fluid. Electronically Signed   By: Tiburcio Pea M.D.   On: 01/20/2024 04:09   DG CHEST PORT 1 VIEW Result Date: 01/19/2024 CLINICAL DATA:  Chest pain EXAM: PORTABLE CHEST 1 VIEW COMPARISON:  04/18/2018, 01/17/2024 FINDINGS: Single frontal view of the chest demonstrates enteric catheter passing below diaphragm, tip and side port projecting over the region of the gastric body. Cardiac silhouette is enlarged. There is increased density in the AP window, which could reflect dilated pulmonary artery,  aortic aneurysm, or mediastinal adenopathy. CT chest with IV contrast recommended. No airspace disease, effusion, or pneumothorax. No acute bony abnormalities. Surgical clips midline upper abdomen consistent with recent laparotomy. Diffuse gaseous distention of the bowel may reflect residual obstruction or postoperative ileus. IMPRESSION: 1. Increased density in the AP window, which may reflect dilated pulmonary artery, descending thoracic aortic aneurysm, or lymphadenopathy. Further evaluation with chest CT is recommended. According to the referring physician, there is a history of underlying acute renal insufficiency. An unenhanced chest CT could be performed to identify the etiology the chest x-ray finding, with any further evaluation requiring IV contrast dictated by the findings on unenhanced CT and clinical picture. 2. Stable enlarged cardiac silhouette. 3. No acute airspace disease. 4. Enteric catheter tip projecting over gastric body. Persistent gas distention of the bowel may reflect residual obstruction or postoperative ileus. Critical Value/emergent results were called by telephone at the time of interpretation on 01/19/2024 at 5:20 pm to provider [facility] , who verbally acknowledged these results. Electronically Signed   By: Sharlet Salina M.D.   On: 01/19/2024 17:29        Scheduled Meds:  diclofenac Sodium  2 g Topical TID   heparin  5,000 Units Subcutaneous Q8H   insulin aspart  0-9 Units Subcutaneous Q4H   lidocaine  1 patch Transdermal Q24H   metoprolol tartrate  5 mg Intravenous Q8H   pantoprazole (PROTONIX) IV  40 mg Intravenous Q12H   simethicone  80 mg Oral QID   Continuous Infusions:  lactated ringers       LOS: 3 days    Time spent: 40 minutes    Ramiro Harvest, MD [facility]   To contact the attending provider between 7A-7P or the covering provider during after hours 7P-7A, please log into the web site [email or web address] and access using universal [facility] password for that web site. If you do not have the password, please call the hospital operator.  01/20/2024, 12:31 PM

## 2024-01-20 NOTE — Evaluation (Addendum)
 Physical Therapy Evaluation  Patient Details Name: [name] MRN: [id number] DOB: [birth year] Today's Date: 01/20/2024  History of Present Illness  Pt is an 88 yo female presenting to [facility] on 01/17/24 with worsening abdominal pain. CT scan showed a SBO, and she is now s/p open right hemicolectomy with ileocolic anastomosis 01/18/24. PMH signiifcant for DM II, CKD stage IIIa, HTN, hypothyroidism, iron deficiency anemia, essential thrombocytosis, glaucoma.   Clinical Impression  Pt admitted with above diagnosis. Pt currently with functional limitations due to the deficits listed below (see PT Problem List). At the time of PT eval pt was able to perform transfers and ambulation with gross min assist to CGA and RW for support. Pt motivated to participate throughout session. Anticipate pt will be able to return home with post-acute therapies to follow up. Pt will benefit from acute skilled PT to increase their independence and safety with mobility to allow discharge.           If plan is discharge home, recommend the following: A little help with walking and/or transfers;A little help with bathing/dressing/bathroom;Assistance with cooking/housework;Assist for transportation;Help with stairs or ramp for entrance   Can travel by private vehicle        Equipment Recommendations Rolling walker (2 wheels);BSC/3in1  Recommendations for Other Services       Functional Status Assessment Patient has had a recent decline in their functional status and demonstrates the ability to make significant improvements in function in a reasonable and predictable amount of time.     Precautions / Restrictions Precautions Precautions: Fall Recall of Precautions/Restrictions: Intact Precaution/Restrictions Comments: NG tube Restrictions Weight Bearing Restrictions Per Provider Order: No      Mobility  Bed Mobility Overal bed mobility: Needs Assistance Bed Mobility: Rolling, Sidelying to Sit                 Transfers Overall transfer level: Needs assistance Equipment used: Rolling walker (2 wheels) Transfers: Sit to/from Stand Sit to Stand: Min assist, +2 safety/equipment           General transfer comment: Min assist initially. VC's for hand placement on seated surface for safety. Improved from elevated BSC .    Ambulation/Gait Ambulation/Gait assistance: Contact guard assist, Min assist, +2 safety/equipment Gait Distance (Feet): 50 Feet Assistive device: Rolling walker (2 wheels) Gait Pattern/deviations: Step-through pattern, Decreased stride length, Trunk flexed, Narrow base of support Gait velocity: Decreased Gait velocity interpretation: <1.8 ft/sec, indicate of risk for recurrent falls   General Gait Details: Slow and guarded but motivated to ambulate with encouragement. 2nd person helpful for lines and chair follow, however pt did not require a seated rest break during gait training. Occasional assist for walker management, however pt without overt LOB.  Stairs            Wheelchair Mobility     Tilt Bed    Modified Rankin (Stroke Patients Only)       Balance Overall balance assessment: Needs assistance Sitting-balance support: Feet supported, No upper extremity supported Sitting balance-Leahy Scale: Fair     Standing balance support: Bilateral upper extremity supported, During functional activity, Reliant on assistive device for balance Standing balance-Leahy Scale: Poor                               Pertinent Vitals/Pain Pain Assessment Pain Assessment: Faces Faces Pain Scale: Hurts a little bit Pain Location: abdomen Pain Descriptors / Indicators: Operative site guarding, Sore  Pain Intervention(s): Limited activity within patient's tolerance, Monitored during session, Repositioned    Home Living Family/patient expects to be discharged to:: Private residence Living Arrangements: Children Available Help at Discharge: Family Type  of Home: Apartment         Home Layout: One level Home Equipment: Agricultural consultant (2 wheels)      Prior Function Prior Level of Function : Independent/Modified Independent                     Extremity/Trunk Assessment   Upper Extremity Assessment Upper Extremity Assessment: Overall WFL for tasks assessed (LUE mildly shaky with ambulation with RW but otherwise using functionally so not formally assessed)    Lower Extremity Assessment Lower Extremity Assessment: Defer to PT evaluation    Cervical / Trunk Assessment Cervical / Trunk Assessment: Other exceptions Cervical / Trunk Exceptions: Forward head posture with rounded shoulders  Communication   Communication Communication: No apparent difficulties (Difficult to understand at times. Son present and understand her well.) Factors Affecting Communication: Reduced clarity of speech    Cognition Arousal: Alert Behavior During Therapy: WFL for tasks assessed/performed   PT - Cognitive impairments: No apparent impairments                         Following commands: Intact       Cueing Cueing Techniques: Verbal cues, Gestural cues, Tactile cues     General Comments General comments (skin integrity, edema, etc.): SpO2 86% on RA at start of session with NT present taking vitals. NT placed pt on 2L/min supplemental O2 for duration of session.    Exercises     Assessment/Plan    PT Assessment Patient needs continued PT services  PT Problem List Decreased strength;Decreased activity tolerance;Decreased balance;Decreased mobility;Decreased knowledge of use of DME;Decreased safety awareness;Decreased knowledge of precautions;Cardiopulmonary status limiting activity;Pain       PT Treatment Interventions DME instruction;Gait training;Stair training;Functional mobility training;Therapeutic activities;Therapeutic exercise;Balance training;Patient/family education    PT Goals (Current goals can be found in the  Care Plan section)  Acute Rehab PT Goals Patient Stated Goal: Home at d/c PT Goal Formulation: With patient/family Time For Goal Achievement: 02/03/24 Potential to Achieve Goals: Good    Frequency Min 2X/week     Co-evaluation PT/OT/SLP Co-Evaluation/Treatment: Yes Reason for Co-Treatment: Complexity of the patient's impairments (multi-system involvement);For patient/therapist safety;To address functional/ADL transfers PT goals addressed during session: Mobility/safety with mobility;Balance;Proper use of DME OT goals addressed during session: ADL's and self-care       AM-PAC PT "6 Clicks" Mobility  Outcome Measure Help needed turning from your back to your side while in a flat bed without using bedrails?: A Little Help needed moving from lying on your back to sitting on the side of a flat bed without using bedrails?: A Little Help needed moving to and from a bed to a chair (including a wheelchair)?: A Little Help needed standing up from a chair using your arms (e.g., wheelchair or bedside chair)?: A Little Help needed to walk in hospital room?: A Little Help needed climbing 3-5 steps with a railing? : A Little 6 Click Score: 18    End of Session Equipment Utilized During Treatment: Gait belt;Oxygen Activity Tolerance: Patient tolerated treatment well Patient left: in chair;with call bell/phone within reach;with chair alarm set;with family/visitor present Nurse Communication: Mobility status PT Visit Diagnosis: Unsteadiness on feet (R26.81);Pain Pain - part of body:  (abdomen)    Time: 3244-0102 PT  Time Calculation (min) (ACUTE ONLY): 35 min   Charges:   PT Evaluation $PT Eval Moderate Complexity: 1 Mod   PT General Charges $$ ACUTE PT VISIT: 1 Visit         Conni Slipper, PT, DPT Acute Rehabilitation Services Secure Chat Preferred Office: [phone]   Marylynn Pearson 01/20/2024, 3:00 PM

## 2024-01-20 NOTE — Evaluation (Addendum)
 Occupational Therapy Evaluation Patient Details Name: [name] MRN: [id number] DOB: 04-03-1936 Today's Date: 01/20/2024   History of Present Illness   Pt is an 88 yo female presenting to [facility] on 01/17/24 with worsening abdominal pain. CT scan showed a SBO, and she is now s/p open right hemicolectomy with ileocolic anastomosis 01/18/24. PMH signiifcant for DM II, CKD stage IIIa, HTN, hypothyroidism, iron deficiency anemia, essential thrombocytosis, glaucoma.     Clinical Impressions PTA, pt lived with daughter and was mod I for ADL. Upon eval, pt with decreased strength, balance, and activity tolerance. Pt performing LB Adl with up to max A and LB ADL with set-up. Pt grossly needing min A -CGA for transfers and functional mobility OOB. Will continue to follow acutely and recommending HHOT at discharge.      If plan is discharge home, recommend the following:   A little help with walking and/or transfers;A lot of help with bathing/dressing/bathroom;Assistance with feeding;Assist for transportation;Help with stairs or ramp for entrance     Functional Status Assessment   Patient has had a recent decline in their functional status and demonstrates the ability to make significant improvements in function in a reasonable and predictable amount of time.     Equipment Recommendations   BSC/3in1     Recommendations for Other Services         Precautions/Restrictions   Precautions Precautions: Fall Recall of Precautions/Restrictions: Intact Precaution/Restrictions Comments: NG tube Restrictions Weight Bearing Restrictions Per Provider Order: No     Mobility Bed Mobility Overal bed mobility: Needs Assistance Bed Mobility: Rolling, Sidelying to Sit Rolling: Min assist Sidelying to sit: Min assist            Transfers Overall transfer level: Needs assistance Equipment used: Rolling walker (2 wheels) Transfers: Sit to/from Stand Sit to Stand: Min assist, +2  safety/equipment           General transfer comment: Min assist initially. VC's for hand placement on seated surface for safety. Improved from elevated BSC .      Balance Overall balance assessment: Needs assistance Sitting-balance support: Feet supported, No upper extremity supported Sitting balance-Leahy Scale: Fair     Standing balance support: Bilateral upper extremity supported, During functional activity, Reliant on assistive device for balance Standing balance-Leahy Scale: Poor                             ADL either performed or assessed with clinical judgement   ADL Overall ADL's : Needs assistance/impaired Eating/Feeding: Set up;Sitting   Grooming: Set up;Sitting   Upper Body Bathing: Set up;Sitting   Lower Body Bathing: Moderate assistance;Sit to/from stand   Upper Body Dressing : Set up;Sitting   Lower Body Dressing: Moderate assistance;Sit to/from stand   Toilet Transfer: Contact guard assist;Ambulation;Comfort height toilet           Functional mobility during ADLs: Contact guard assist;Rolling walker (2 wheels)       Vision   Vision Assessment?: No apparent visual deficits     Perception Perception: Not tested       Praxis Praxis: Not tested       Pertinent Vitals/Pain Pain Assessment Pain Assessment: Faces Faces Pain Scale: Hurts a little bit Pain Location: abdomen Pain Descriptors / Indicators: Operative site guarding, Sore Pain Intervention(s): Limited activity within patient's tolerance, Monitored during session     Extremity/Trunk Assessment Upper Extremity Assessment Upper Extremity Assessment: Overall WFL for tasks assessed (LUE mildly  shaky with ambulation with RW but otherwise using functionally so not formally assessed)   Lower Extremity Assessment Lower Extremity Assessment: Defer to PT evaluation   Cervical / Trunk Assessment Cervical / Trunk Assessment: Other exceptions Cervical / Trunk Exceptions: Forward  head posture with rounded shoulders   Communication Communication Communication: No apparent difficulties (Difficult to understand at times. Son present and understand her well.) Factors Affecting Communication: Reduced clarity of speech   Cognition Arousal: Alert Behavior During Therapy: WFL for tasks assessed/performed Cognition: No apparent impairments                               Following commands: Intact       Cueing  General Comments   Cueing Techniques: Verbal cues;Gestural cues;Tactile cues  SpO2 86% on RA at start of session with NT present taking vitals. NT placed pt on 2L/min supplemental O2 for duration of session.   Exercises     Shoulder Instructions      Home Living Family/patient expects to be discharged to:: Private residence Living Arrangements: Children Available Help at Discharge: Family Type of Home: Apartment       Home Layout: One level     Bathroom Shower/Tub: Chief Strategy Officer: Standard     Home Equipment: Agricultural consultant (2 wheels)          Prior Functioning/Environment Prior Level of Function : Independent/Modified Independent                    OT Problem List: Decreased strength;Impaired balance (sitting and/or standing);Decreased activity tolerance   OT Treatment/Interventions: Therapeutic exercise;Self-care/ADL training;DME and/or AE instruction;Balance training;Patient/family education;Therapeutic activities      OT Goals(Current goals can be found in the care plan section)   Acute Rehab OT Goals Patient Stated Goal: get better OT Goal Formulation: With patient Time For Goal Achievement: 02/03/24 Potential to Achieve Goals: Good   OT Frequency:  Min 1X/week    Co-evaluation PT/OT/SLP Co-Evaluation/Treatment: Yes Reason for Co-Treatment: Complexity of the patient's impairments (multi-system involvement);For patient/therapist safety;To address functional/ADL transfers PT goals  addressed during session: Mobility/safety with mobility;Balance;Proper use of DME OT goals addressed during session: ADL's and self-care      AM-PAC OT "6 Clicks" Daily Activity     Outcome Measure Help from another person eating meals?: None Help from another person taking care of personal grooming?: A Little Help from another person toileting, which includes using toliet, bedpan, or urinal?: A Little Help from another person bathing (including washing, rinsing, drying)?: A Lot Help from another person to put on and taking off regular upper body clothing?: A Little Help from another person to put on and taking off regular lower body clothing?: A Lot 6 Click Score: 17   End of Session Equipment Utilized During Treatment: Gait belt;Rolling walker (2 wheels) Nurse Communication: Mobility status  Activity Tolerance: Patient tolerated treatment well Patient left: in chair;with call bell/phone within reach;with chair alarm set;with family/visitor present  OT Visit Diagnosis: Unsteadiness on feet (R26.81);Muscle weakness (generalized) (M62.81)                Time: 1914-7829 OT Time Calculation (min): 39 min Charges:  OT General Charges $OT Visit: 1 Visit OT Evaluation $OT Eval Moderate Complexity: 1 Mod  Tyler Deis, OTR/L [facility] Acute Rehabilitation Office: [phone]   Myrla Halsted 01/20/2024, 2:40 PM

## 2024-01-21 ENCOUNTER — Inpatient Hospital Stay (HOSPITAL_COMMUNITY)

## 2024-01-21 ENCOUNTER — Other Ambulatory Visit: Payer: Self-pay

## 2024-01-21 ENCOUNTER — Encounter (HOSPITAL_COMMUNITY): Payer: Self-pay | Admitting: Surgery

## 2024-01-21 DIAGNOSIS — K9189 Other postprocedural complications and disorders of digestive system: Secondary | ICD-10-CM

## 2024-01-21 DIAGNOSIS — D473 Essential (hemorrhagic) thrombocythemia: Secondary | ICD-10-CM | POA: Diagnosis not present

## 2024-01-21 DIAGNOSIS — I10 Essential (primary) hypertension: Secondary | ICD-10-CM | POA: Diagnosis not present

## 2024-01-21 DIAGNOSIS — N1831 Chronic kidney disease, stage 3a: Secondary | ICD-10-CM | POA: Diagnosis not present

## 2024-01-21 DIAGNOSIS — K56609 Unspecified intestinal obstruction, unspecified as to partial versus complete obstruction: Secondary | ICD-10-CM | POA: Diagnosis not present

## 2024-01-21 DIAGNOSIS — K567 Ileus, unspecified: Secondary | ICD-10-CM

## 2024-01-21 LAB — GLUCOSE, CAPILLARY
Glucose-Capillary: 190 mg/dL — ABNORMAL HIGH (ref 70–99)
Glucose-Capillary: 69 mg/dL — ABNORMAL LOW (ref 70–99)
Glucose-Capillary: 72 mg/dL (ref 70–99)
Glucose-Capillary: 78 mg/dL (ref 70–99)
Glucose-Capillary: 82 mg/dL (ref 70–99)

## 2024-01-21 LAB — CBC WITH DIFFERENTIAL/PLATELET
Abs Immature Granulocytes: 0.08 10*3/uL — ABNORMAL HIGH (ref 0.00–0.07)
Basophils Absolute: 0 10*3/uL (ref 0.0–0.1)
Basophils Relative: 1 %
Eosinophils Absolute: 0.3 10*3/uL (ref 0.0–0.5)
Eosinophils Relative: 3 %
HCT: 23.3 % — ABNORMAL LOW (ref 36.0–46.0)
Hemoglobin: 7.2 g/dL — ABNORMAL LOW (ref 12.0–15.0)
Immature Granulocytes: 1 %
Lymphocytes Relative: 14 %
Lymphs Abs: 1.3 10*3/uL (ref 0.7–4.0)
MCH: 25.4 pg — ABNORMAL LOW (ref 26.0–34.0)
MCHC: 30.9 g/dL (ref 30.0–36.0)
MCV: 82.3 fL (ref 80.0–100.0)
Monocytes Absolute: 0.6 10*3/uL (ref 0.1–1.0)
Monocytes Relative: 7 %
Neutro Abs: 6.6 10*3/uL (ref 1.7–7.7)
Neutrophils Relative %: 74 %
Platelets: 546 10*3/uL — ABNORMAL HIGH (ref 150–400)
RBC: 2.83 MIL/uL — ABNORMAL LOW (ref 3.87–5.11)
RDW: 18.5 % — ABNORMAL HIGH (ref 11.5–15.5)
WBC: 8.9 10*3/uL (ref 4.0–10.5)
nRBC: 0.3 % — ABNORMAL HIGH (ref 0.0–0.2)

## 2024-01-21 LAB — BASIC METABOLIC PANEL WITH GFR
Anion gap: 12 (ref 5–15)
BUN: 15 mg/dL (ref 8–23)
CO2: 24 mmol/L (ref 22–32)
Calcium: 8.3 mg/dL — ABNORMAL LOW (ref 8.9–10.3)
Chloride: 104 mmol/L (ref 98–111)
Creatinine, Ser: 1.28 mg/dL — ABNORMAL HIGH (ref 0.44–1.00)
GFR, Estimated: 41 mL/min — ABNORMAL LOW
Glucose, Bld: 165 mg/dL — ABNORMAL HIGH (ref 70–99)
Potassium: 2.8 mmol/L — ABNORMAL LOW (ref 3.5–5.1)
Sodium: 140 mmol/L (ref 135–145)

## 2024-01-21 LAB — PREPARE RBC (CROSSMATCH)

## 2024-01-21 LAB — MAGNESIUM: Magnesium: 2.1 mg/dL (ref 1.7–2.4)

## 2024-01-21 MED ORDER — DEXTROSE 50 % IV SOLN
12.5000 g | INTRAVENOUS | Status: AC
  Administered 2024-01-21: 12.5 g via INTRAVENOUS
  Filled 2024-01-21: qty 50

## 2024-01-21 MED ORDER — SODIUM CHLORIDE 0.9% FLUSH
10.0000 mL | Freq: Two times a day (BID) | INTRAVENOUS | Status: DC
Start: 2024-01-21 — End: 2024-01-30
  Administered 2024-01-21: 10 mL
  Administered 2024-01-21: 30 mL
  Administered 2024-01-22: 20 mL
  Administered 2024-01-22 – 2024-01-30 (×16): 10 mL

## 2024-01-21 MED ORDER — SODIUM CHLORIDE 0.9% IV SOLUTION: Freq: Once | INTRAVENOUS | Status: AC

## 2024-01-21 MED ORDER — CHLORHEXIDINE GLUCONATE CLOTH 2 % EX PADS
6.0000 | MEDICATED_PAD | Freq: Every day | CUTANEOUS | Status: DC
  Administered 2024-01-21 – 2024-01-29 (×9): 6 via TOPICAL

## 2024-01-21 MED ORDER — SODIUM CHLORIDE 0.9% FLUSH: 10.0000 mL | INTRAVENOUS | Status: DC | PRN

## 2024-01-21 MED ORDER — POTASSIUM CHLORIDE 10 MEQ/100ML IV SOLN
10.0000 meq | INTRAVENOUS | Status: AC
  Administered 2024-01-21 (×4): 10 meq via INTRAVENOUS
  Filled 2024-01-21: qty 100

## 2024-01-21 MED ORDER — POTASSIUM CHLORIDE 10 MEQ/100ML IV SOLN
10.0000 meq | Freq: Once | INTRAVENOUS | Status: AC
  Administered 2024-01-21: 10 meq via INTRAVENOUS

## 2024-01-21 MED ORDER — ACETAMINOPHEN 650 MG RE SUPP
325.0000 mg | RECTAL | Status: DC | PRN
  Administered 2024-01-21: 325 mg via RECTAL

## 2024-01-21 MED ORDER — FUROSEMIDE 10 MG/ML IJ SOLN
20.0000 mg | Freq: Once | INTRAMUSCULAR | Status: AC
  Administered 2024-01-21: 20 mg via INTRAVENOUS
  Filled 2024-01-21: qty 4

## 2024-01-21 MED ORDER — ACETAMINOPHEN 650 MG RE SUPP
325.0000 mg | Freq: Once | RECTAL | Status: AC
  Administered 2024-01-21: 325 mg via RECTAL
  Filled 2024-01-21: qty 1

## 2024-01-21 MED ORDER — DEXTROSE-SODIUM CHLORIDE 5-0.9 % IV SOLN: INTRAVENOUS | Status: DC

## 2024-01-21 MED ORDER — DICLOFENAC SODIUM 1 % EX GEL: 2.0000 g | Freq: Four times a day (QID) | CUTANEOUS | Status: AC | PRN

## 2024-01-21 NOTE — TOC Progression Note (Signed)
 Transition of Care [facility]) - Progression Note    Patient Details  Name: [name] MRN: [id number] Date of Birth: Mar 21, 1936  Transition of [facility] CM/SW Contact  Gordy Clement, RN Phone Number: 01/21/2024, 3:47 PM  Clinical Narrative:     Patient being recommended a RW and a BSC. [employer] will provide and deliver bedside  Home Health PT and OT have also been recommended. Anticipate SN may be needed as well but will follow. [facility] has accepted this referral and final orders for [facility] to be placed.   TOC will continue to follow patient for any additional discharge needs            Expected Discharge Plan and Services                                               Social Determinants of Health (SDOH) Interventions SDOH Screenings   Food Insecurity: No Food Insecurity (01/18/2024)  Housing: Low Risk  (01/18/2024)  Transportation Needs: No Transportation Needs (01/18/2024)  Utilities: Not At Risk (01/18/2024)  Social Connections: Unknown (01/18/2024)  Tobacco Use: Low Risk  (01/18/2024)    Readmission Risk Interventions     No data to display

## 2024-01-21 NOTE — Progress Notes (Signed)
 PROGRESS NOTE    [name]  ZOX:[id number] DOB: [birth year] DOA: 01/17/2024 PCP: Jackie Plum, MD    Chief Complaint  Patient presents with   Abdominal Pain   Nausea    Brief Narrative:  [name] is a 88 y.o. 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  General surgery consulted and patient to the OR today 01/18/2024.   Assessment & Plan:   Principal Problem:   Small bowel obstruction (HCC) Active Problems:   Postoperative ileus (HCC)   Hypothyroidism   Essential hypertension, benign   Essential thrombocytosis (HCC)   Dyslipidemia   Chest pain   Iron deficiency anemia due to chronic blood loss   Colonic mass   Chronic kidney disease, stage 3a (HCC)   Urinary tract infection without hematuria   Hypomagnesemia   AKI (acute kidney injury) (HCC)  #1 small bowel obstruction secondary to large ileocecal/ascending colonic mass--suspected malignancy -Patient noted to have presented with a 3-day history of lower abdominal pain associated nausea vomiting abdominal distention progressively poorly worsening.  Patient seen in the ED. -CT abdomen and pelvis done consistent with small bowel obstruction secondary to large ileocecal/ascending colonic mass measuring up to 7.3 cm.  No perforation noted.  Enlarged mesenteric nodes in the vicinity of the colon mass suspicious for metastatic adenopathy.  Indeterminate splenic masses measuring up to 2.7 cm centimeters. -NG tube placed. -Patient seen in consultation by general surgery who assessed patient and patient underwent open right hemicolectomy with ileocolic anastomosis on 01/18/2024.   -Currently n.p.o. with bowel rest.   -Awaiting bowel function. -Mobilize.  -CT scan reviewed by general surgery and abdominal findings noted on CT scan  as expected for postop day #2 for laparotomy per general surgeon. -Per general surgery.  2.  Postop ileus -Patient with postop ileus. -No flatus or bowel movement. -Keep electrolytes repleted with potassium at approximately 4, magnesium at 2. -Mobilize. -Per general surgery.  3.  Atypical chest pain -Patient noted with complaints of atypical chest pain localized on the left as well as left upper abdominal pain and nonradiating on 01/19/2024 -Patient describes the pain as a gas bubble. -Patient with somewhat reproducible chest pain. -EKG with no ischemic changes noted.   -Cardiac enzymes negative.   -Chest x-ray with increased density in the AP window which may reflect a dilated pulmonary artery, descending thoracic aortic aneurysm or lymphadenopathy.  CT chest recommended.  Stable enlarged cardiac silhouette.  No acute airspace disease.   -CT chest obtained without contrast with marked main pulmonary artery enlargement compatible with pulmonary hypertension relating to radiographic abnormality.  Aorta elongated and tortuous.  No mass or adenopathy in the chest.  Dependent atelectasis with small volume pleural fluid noted.   -Clinical improvement.   -Continue IV PPI twice daily, Voltaren gel, simethicone.   -Supportive care.  4.  Iron deficiency anemia/acute postop anemia -Hemoglobin currently at 7.2 from 9.0.   -Likely postop acute blood loss anemia and also likely partly dilutional as patient placed on IV fluids..   -Transfused 2 units PRBCs. -Follow H&H. -Transfusion threshold hemoglobin < 8.  5.  Hypokalemia/hypomagnesemia -Potassium at 2.8, magnesium at 2.1.   -KCl 10 mEq IV every hour x 5 rounds.  -Repeat labs in the AM.  6.  Probable UTI -Urinalysis moderate leukocytes, nitrite negative, rare bacteria, WBC 21-50. -Urine cultures with 30,000 colonies of Klebsiella  pneumonia. -Status post 3 days IV Rocephin.   7.  AKI on CKD stage IIIa -Likely secondary to prerenal azotemia  as patient presented with small bowel obstruction, nausea vomiting and has been n.p.o. since admission.   -Urine sodium, urine creatinine pending.   -Urine output not recorded.   -Patient however states is having urine output.   -Renal function improved with hydration.  8.  Well-controlled diabetes mellitus type 2 -Hemoglobin A1c 5.7. -CBG 69 this morning. -Patient currently NPO. -Continue to hold home regimen Glucophage. -May need to be placed on D5NS at [facility] if blood glucose levels continue to remain low while patient is NPO.  9.  Hypertension -Currently n.p.o. due to small bowel obstruction and unable to take oral intake. -Home oral antihypertensive medications on hold.   -Continue IV Lopressor 5 mg every 8 hours.   10.  Essential thrombocytosis -Follows with hematology, Dr. [name] -Managed on anagrelide which is currently on hold. -Hematology/oncology notified via epic of admission.  11.  Hypothyroidism -Synthroid on hold as patient currently NPO. -If patient continues to be n.p.o. and not tolerating diet may start IV Synthroid in the next 24 hours.  12.  Hyperlipidemia -Continue to hold statin, likely resume on discharge.    13.  Splenic masses -Indeterminate splenic masses measuring up to 2.7 cm noted on CT abdomen and pelvis. -May need a dedicated abdominal MRI for further evaluation once patient is clinically stable.   DVT prophylaxis: Heparin Code Status: Full Family Communication: Updated patient and son at bedside. Disposition: TBD  Status is: Inpatient Remains inpatient appropriate because: Severity of illness   Consultants:  General Surgery: Dr. Sheliah Hatch 01/18/2024  Procedures:  CT abdomen and pelvis 01/17/2024 Abdominal films 01/17/2024, 01/18/2024 Open right hemicolectomy with ileocolic anastomosis per general surgery: Dr. Allen/Dr. Violeta Gelinas CT chest 01/19/2024 Chest x-ray 01/19/2024 PICC line pending Transfuse 2 units PRBCs pending,  01/21/2024  Antimicrobials:  Anti-infectives (From admission, onward)    Start     Dose/Rate Route Frequency Ordered Stop   01/18/24 0915  cefoTEtan (CEFOTAN) 2 g in sodium chloride 0.9 % 100 mL IVPB        2 g 200 mL/hr over 30 Minutes Intravenous On call to O.R. 01/18/24 0819 01/18/24 2042   01/18/24 0915  cefTRIAXone (ROCEPHIN) 2 g in sodium chloride 0.9 % 100 mL IVPB        2 g 200 mL/hr over 30 Minutes Intravenous Every 24 hours 01/18/24 0819 01/20/24 1011         Subjective: Laying in bed.  NG tube in place with bilious drainage.  Patient denies any significant chest pain.  No shortness of breath.  Still with abdominal pain however has improved per patient.  No flatus, no bowel movement.  Patient denies any overt bleeding.  Son at bedside.   Objective: Vitals:   01/20/24 2020 01/21/24 0500 01/21/24 0509 01/21/24 0723  BP: (!) 153/80  (!) 165/83 (!) 171/76  Pulse: 79  81 67  Resp: 18  19 18   Temp: 99 F (37.2 C)  98.9 F (37.2 C) 98.9 F (37.2 C)  TempSrc:      SpO2: 98%  97% 97%  Weight:  62.8 kg    Height:        Intake/Output Summary (Last 24 hours) at 01/21/2024 1009 Last data filed at 01/21/2024 0600 Gross per 24 hour  Intake 1547.1 ml  Output --  Net 1547.1 ml   Filed Weights   01/18/24 0444 01/18/24 1013 01/21/24  0500  Weight: 58.3 kg 57.6 kg 62.8 kg    Examination:  General exam: Appears calm and comfortable.  NG tube in place with bilious material in canister. Respiratory system: Lungs clear to auscultation bilaterally.  No wheezes, no crackles, no rhonchi.  Fair air movement.  Speaking in full sentences.  Cardiovascular system: RRR no murmurs rubs or gallops.  No JVD.  No lower extremity edema.  Chest wall with decreased tenderness to palpation.  Gastrointestinal system: Abdomen is soft, distended, hypoactive bowel sounds, decreased tenderness to palpation diffusely.  Honeycomb dressing intact.  Central nervous system: Alert and oriented. No focal  neurological deficits. Extremities: Symmetric 5 x 5 power. Skin: No rashes, lesions or ulcers Psychiatry: Judgement and insight appear normal. Mood & affect appropriate.     Data Reviewed: I have personally reviewed following labs and imaging studies  CBC: Recent Labs  Lab 01/17/24 1714 01/18/24 0456 01/19/24 0708 01/20/24 0415 01/20/24 1509 01/21/24 0646  WBC 11.9* 10.9* 12.5* 10.7*  --  8.9  NEUTROABS 9.6*  --   --   --   --  6.6  HGB 9.9* 9.7* 9.0* 8.1* 7.9* 7.2*  HCT 31.6* 30.4* 28.5* 26.3* 25.5* 23.3*  MCV 81.4 79.8* 81.2 80.2  --  82.3  PLT 465* 478* 461* 392  --  546*    Basic Metabolic Panel: Recent Labs  Lab 01/17/24 1830 01/18/24 0456 01/19/24 0708 01/20/24 0415 01/21/24 0646  NA 140 138 139 139 140  K 3.2* 3.7 3.7 3.7 2.8*  CL 105 106 106 107 104  CO2 25 25 25  20* 24  GLUCOSE 120* 114* 127* 106* 165*  BUN 19 19 19 23 15   CREATININE 1.07* 1.24* 1.69* 1.67* 1.28*  CALCIUM 8.9 8.8* 8.1* 8.5* 8.3*  MG  --  2.0 1.6* 2.3 2.1  PHOS  --   --  5.4*  --   --     GFR: Estimated Creatinine Clearance: 26.8 mL/min (A) (by C-G formula based on SCr of 1.28 mg/dL (H)).  Liver Function Tests: Recent Labs  Lab 01/17/24 1830 01/19/24 0708  AST 20  --   ALT 9  --   ALKPHOS 53  --   BILITOT 0.5  --   PROT 6.5  --   ALBUMIN 2.8* 2.4*    CBG: Recent Labs  Lab 01/20/24 1121 01/20/24 1543 01/21/24 0002 01/21/24 0552 01/21/24 0618  GLUCAP 89 89 82 69* 190*     Recent Results (from the past 240 hours)  Urine Culture     Status: Abnormal   Collection Time: 01/17/24  8:10 PM   Specimen: Urine, Clean Catch  Result Value Ref Range Status   Specimen Description URINE, CLEAN CATCH  Final   Special Requests   Final    NONE Performed at [facility], [address], [place], [place] [address]    Culture 30,000 COLONIES/mL KLEBSIELLA PNEUMONIAE (A)  Final   Report Status 01/19/2024 FINAL  Final   Organism ID, Bacteria KLEBSIELLA PNEUMONIAE (A)  Final       Susceptibility   Klebsiella pneumoniae - MIC*    AMPICILLIN RESISTANT Resistant     CEFAZOLIN <=4 SENSITIVE Sensitive     CEFEPIME <=0.12 SENSITIVE Sensitive     CEFTRIAXONE <=0.25 SENSITIVE Sensitive     CIPROFLOXACIN <=0.25 SENSITIVE Sensitive     GENTAMICIN <=1 SENSITIVE Sensitive     IMIPENEM <=0.25 SENSITIVE Sensitive     NITROFURANTOIN 128 RESISTANT Resistant     TRIMETH/SULFA <=20  SENSITIVE Sensitive     AMPICILLIN/SULBACTAM 4 SENSITIVE Sensitive     PIP/TAZO <=4 SENSITIVE Sensitive ug/mL    * 30,000 COLONIES/mL KLEBSIELLA PNEUMONIAE         Radiology Studies: [place] EKG SITE RITE Result Date: 01/21/2024 If Site Rite image not attached, placement could not be confirmed due to current cardiac rhythm.  CT CHEST WO CONTRAST Result Date: 01/20/2024 CLINICAL DATA:  Abnormal chest x-ray EXAM: CT CHEST WITHOUT CONTRAST TECHNIQUE: Multidetector CT imaging of the chest was performed following the standard protocol without IV contrast. RADIATION DOSE REDUCTION: This exam was performed according to the departmental dose-optimization program which includes automated exposure control, adjustment of the mA and/or kV according to patient size and/or use of iterative reconstruction technique. COMPARISON:  None Available. FINDINGS: Cardiovascular: Marked cardiac enlargement. No pericardial effusion. The aorta is elongated with ascending segment measuring up to 3.9 cm and transverse segment measuring up to 3.3 cm. Radiographic abnormality is primarily related to marked enlargement the main pulmonary artery which is 5.2 cm in diameter. Mediastinum/Nodes: Negative for mass or adenopathy. Unremarkable enteric tube traversing the esophagus. Lungs/Pleura: Dependent atelectasis with trace pleural fluid. No interstitial edema. Few pulmonary cysts are seen in the apical lungs. Upper Abdomen: Small volume ascites and pneumoperitoneum seen in the upper abdomen. An enteric tube at least reaches the stomach. The  patient underwent recent colectomy. Musculoskeletal: Osteopenia.  No acute finding IMPRESSION: Radiographic abnormality correlates with marked main pulmonary artery enlargement compatible with pulmonary hypertension. The aorta is also elongated and tortuous. No mass or adenopathy the chest. Dependent atelectasis with small volume pleural fluid. Electronically Signed   By: Tiburcio Pea M.D.   On: 01/20/2024 04:09   DG CHEST PORT 1 VIEW Result Date: 01/19/2024 CLINICAL DATA:  Chest pain EXAM: PORTABLE CHEST 1 VIEW COMPARISON:  04/18/2018, 01/17/2024 FINDINGS: Single frontal view of the chest demonstrates enteric catheter passing below diaphragm, tip and side port projecting over the region of the gastric body. Cardiac silhouette is enlarged. There is increased density in the AP window, which could reflect dilated pulmonary artery, aortic aneurysm, or mediastinal adenopathy. CT chest with IV contrast recommended. No airspace disease, effusion, or pneumothorax. No acute bony abnormalities. Surgical clips midline upper abdomen consistent with recent laparotomy. Diffuse gaseous distention of the bowel may reflect residual obstruction or postoperative ileus. IMPRESSION: 1. Increased density in the AP window, which may reflect dilated pulmonary artery, descending thoracic aortic aneurysm, or lymphadenopathy. Further evaluation with chest CT is recommended. According to the referring physician, there is a history of underlying acute renal insufficiency. An unenhanced chest CT could be performed to identify the etiology the chest x-ray finding, with any further evaluation requiring IV contrast dictated by the findings on unenhanced CT and clinical picture. 2. Stable enlarged cardiac silhouette. 3. No acute airspace disease. 4. Enteric catheter tip projecting over gastric body. Persistent gas distention of the bowel may reflect residual obstruction or postoperative ileus. Critical Value/emergent results were called by  telephone at the time of interpretation on 01/19/2024 at 5:20 pm to provider [facility] , who verbally acknowledged these results. Electronically Signed   By: Sharlet Salina M.D.   On: 01/19/2024 17:29        Scheduled Meds:  diclofenac Sodium  2 g Topical TID   furosemide  20 mg Intravenous Once   heparin  5,000 Units Subcutaneous Q8H   insulin aspart  0-9 Units Subcutaneous Q6H   lidocaine  1 patch Transdermal Q24H   metoprolol tartrate  5 mg Intravenous Q8H   pantoprazole (PROTONIX) IV  40 mg Intravenous Q12H   simethicone  80 mg Oral QID   Continuous Infusions:  lactated ringers 100 mL/hr at 01/20/24 2357   potassium chloride 10 mEq (01/21/24 0958)     LOS: 4 days    Time spent: 40 minutes    Ramiro Harvest, MD [facility]   To contact the attending provider between 7A-7P or the covering provider during after hours 7P-7A, please log into the web site [email or web address] and access using universal [place] password for that web site. If you do not have the password, please call the hospital operator.  01/21/2024, 10:09 AM

## 2024-01-21 NOTE — Progress Notes (Signed)
  3 Days Post-Op   Chief Complaint/Subjective: Burping, uncomfortable, feeling week, no flatus or BMs  Objective: Vital signs in last 24 hours: Temp:  [98.4 F (36.9 C)-100.7 F (38.2 C)] 98.9 F (37.2 C) (03/17 0723) Pulse Rate:  [67-88] 67 (03/17 0723) Resp:  [16-19] 18 (03/17 0723) BP: (141-171)/(76-83) 171/76 (03/17 0723) SpO2:  [94 %-100 %] 97 % (03/17 0723) Weight:  [62.8 kg] 62.8 kg (03/17 0500) Last BM Date : 01/17/24 Intake/Output from previous day: 03/16 0701 - 03/17 0700 In: 1547.1 [I.V.:1547.1] Out: -   PE: Gen: NAd Resp: nonlabored Card: RRR Abd: soft, incision clean dry and intact, distended  Lab Results:  Recent Labs    01/20/24 0415 01/20/24 1509 01/21/24 0646  WBC 10.7*  --  8.9  HGB 8.1* 7.9* 7.2*  HCT 26.3* 25.5* 23.3*  PLT 392  --  546*   Recent Labs    01/20/24 0415 01/21/24 0646  NA 139 140  K 3.7 2.8*  CL 107 104  CO2 20* 24  GLUCOSE 106* 165*  BUN 23 15  CREATININE 1.67* 1.28*  CALCIUM 8.5* 8.3*   No results for input(s): "LABPROT", "INR" in the last 72 hours.    Component Value Date/Time   NA 140 01/21/2024 0646   K 2.8 (L) 01/21/2024 0646   CL 104 01/21/2024 0646   CO2 24 01/21/2024 0646   GLUCOSE 165 (H) 01/21/2024 0646   BUN 15 01/21/2024 0646   CREATININE 1.28 (H) 01/21/2024 0646   CREATININE 1.26 (H) 11/13/2023 1248   CALCIUM 8.3 (L) 01/21/2024 0646   PROT 6.5 01/17/2024 1830   ALBUMIN 2.4 (L) 01/19/2024 0708   AST 20 01/17/2024 1830   AST 16 11/13/2023 1248   ALT 9 01/17/2024 1830   ALT 8 11/13/2023 1248   ALKPHOS 53 01/17/2024 1830   BILITOT 0.5 01/17/2024 1830   BILITOT 0.5 11/13/2023 1248   GFRNONAA 41 (L) 01/21/2024 0646   GFRNONAA 42 (L) 11/13/2023 1248   GFRAA 48 (L) 12/02/2018 1255    Assessment/Plan  s/p Procedure(s): COLECTOMY, PARTIAL LAPAROTOMY, EXPLORATORY 01/18/2024  -continue NG tube to suction -receiving 2 units pRBC -PICC planned for today -discussed potential need for TPN for prolonged  ileus  FEN - NPO VTE - heparin 5000 Pyote ID - ceftriaxon day 3/3 for UTI Disposition - ileus after bowel surgery   LOS: 4 days   I reviewed last 24 h vitals and pain scores, last 48 h intake and output, last 24 h labs and trends, and last 24 h imaging results.  This care required high  level of medical decision making.   De Blanch [facility] at [facility] 01/21/2024, 9:45 AM Please see Amion for pager number during day hours 7:00am-4:30pm or 7:00am -11:30am on weekends

## 2024-01-21 NOTE — Care Management Important Message (Signed)
 Important Message  Patient Details  Name: [name] MRN: [id number] Date of Birth: [birth year]   Important Message Given:  Yes - Medicare IM     Dorena Bodo 01/21/2024, 2:22 PM

## 2024-01-21 NOTE — Anesthesia Postprocedure Evaluation (Signed)
 Anesthesia Post Note  Patient: [name]  Procedure(s) Performed: COLECTOMY, PARTIAL (Abdomen) LAPAROTOMY, EXPLORATORY (Abdomen)     Patient location during evaluation: PACU Anesthesia Type: General Level of consciousness: awake and alert Pain management: pain level controlled Vital Signs Assessment: post-procedure vital signs reviewed and stable Respiratory status: spontaneous breathing, nonlabored ventilation, respiratory function stable and patient connected to nasal cannula oxygen Cardiovascular status: blood pressure returned to baseline and stable Postop Assessment: no apparent nausea or vomiting Anesthetic complications: no   No notable events documented.  Last Vitals:  Vitals:   01/21/24 0509 01/21/24 0723  BP: (!) 165/83 (!) 171/76  Pulse: 81 67  Resp: 19 18  Temp: 37.2 C 37.2 C  SpO2: 97% 97%    Last Pain:  Vitals:   01/20/24 1933  TempSrc:   PainSc: 0-No pain                 Najee Cowens S

## 2024-01-21 NOTE — Consult Note (Signed)
 Pt was marked on Friday to a possible ileostomy. The surgery was with no complications and was not necessary to construct an ostomy.  I visited her today, and the patient is recovering as expected.  WOC team will not plan to follow further.  Please reconsult if further assistance is needed. Thank-you,  Denyse Amass BSN, RN, [employer], WOC  (Pager: [phone])

## 2024-01-21 NOTE — Progress Notes (Signed)
 Pt transferred to 2 [place] this evening in stable condition but after pt reporting chest pain this afternoon, MD notified and EKG done. Telemetry order placed so pt transferring to telemetry unit. Pt also received 1 unit of blood and received IV lasix after first unit per order, and will receive 2nd unit tonight. Pt also receiving IV potassium, scheduled IV metoprolol, and IV apresoline for hypertension this afternoon. Incision remains dry and intact, pt had output in NG this shift prior to transfer. Also pt's NG was noted to be out to 35cm and it was advanced back to 60 cm and MD notified- ordered xray to check for placement. Report given to receiving RN and pt transferred in bed to 2w room 26.

## 2024-01-21 NOTE — Progress Notes (Signed)
 PICC consent completed with family at the bedside. Consuello Masse

## 2024-01-21 NOTE — Progress Notes (Signed)
 Peripherally Inserted Central Catheter Placement  The IV Nurse has discussed with the patient and/or persons authorized to consent for the patient, the purpose of this procedure and the potential benefits and risks involved with this procedure.  The benefits include less needle sticks, lab draws from the catheter, and the patient may be discharged home with the catheter. Risks include, but not limited to, infection, bleeding, blood clot (thrombus formation), and puncture of an artery; nerve damage and irregular heartbeat and possibility to perform a PICC exchange if needed/ordered by physician.  Alternatives to this procedure were also discussed.  Bard Power PICC patient education guide, fact sheet on infection prevention and patient information card has been provided to patient /or left at bedside.    PICC Placement Documentation  PICC Double Lumen 01/21/24 Right Brachial 35 cm 0 cm (Active)  Indication for Insertion or Continuance of Line Administration of hyperosmolar/irritating solutions (i.e. TPN, Vancomycin, etc.) 01/21/24 1232  Exposed Catheter (cm) 0 cm 01/21/24 1232  Site Assessment Clean, Dry, Intact 01/21/24 1232  Lumen #1 Status Flushed;Blood return noted;Saline locked 01/21/24 1232  Lumen #2 Status Flushed;Blood return noted;Saline locked 01/21/24 1232  Dressing Type Transparent 01/21/24 1232  Dressing Status Antimicrobial disc/dressing in place 01/21/24 1232  Line Care Connections checked and tightened 01/21/24 1232  Line Adjustment (NICU/IV Team Only) No 01/21/24 1232  Dressing Intervention New dressing 01/21/24 1232  Dressing Change Due 01/28/24 01/21/24 1232       Audrie Gallus 01/21/2024, 12:33 PM

## 2024-01-22 ENCOUNTER — Inpatient Hospital Stay (HOSPITAL_COMMUNITY)

## 2024-01-22 DIAGNOSIS — I10 Essential (primary) hypertension: Secondary | ICD-10-CM | POA: Diagnosis not present

## 2024-01-22 DIAGNOSIS — R9431 Abnormal electrocardiogram [ECG] [EKG]: Secondary | ICD-10-CM

## 2024-01-22 DIAGNOSIS — K56609 Unspecified intestinal obstruction, unspecified as to partial versus complete obstruction: Secondary | ICD-10-CM | POA: Diagnosis not present

## 2024-01-22 DIAGNOSIS — N1831 Chronic kidney disease, stage 3a: Secondary | ICD-10-CM | POA: Diagnosis not present

## 2024-01-22 DIAGNOSIS — D473 Essential (hemorrhagic) thrombocythemia: Secondary | ICD-10-CM | POA: Diagnosis not present

## 2024-01-22 LAB — BPAM RBC
Blood Product Expiration Date: 202504122359
Blood Product Expiration Date: 202504142359
ISSUE DATE / TIME: 202503171333
ISSUE DATE / TIME: 202503172300
Unit Type and Rh: 5100
Unit Type and Rh: 5100

## 2024-01-22 LAB — MAGNESIUM: Magnesium: 1.6 mg/dL — ABNORMAL LOW (ref 1.7–2.4)

## 2024-01-22 LAB — CBC WITH DIFFERENTIAL/PLATELET
Abs Immature Granulocytes: 0.21 10*3/uL — ABNORMAL HIGH (ref 0.00–0.07)
Basophils Absolute: 0 10*3/uL (ref 0.0–0.1)
Basophils Relative: 0 %
Eosinophils Absolute: 0.6 10*3/uL — ABNORMAL HIGH (ref 0.0–0.5)
Eosinophils Relative: 5 %
HCT: 35.1 % — ABNORMAL LOW (ref 36.0–46.0)
Hemoglobin: 11.4 g/dL — ABNORMAL LOW (ref 12.0–15.0)
Immature Granulocytes: 2 %
Lymphocytes Relative: 19 %
Lymphs Abs: 2.2 10*3/uL (ref 0.7–4.0)
MCH: 26.3 pg (ref 26.0–34.0)
MCHC: 32.5 g/dL (ref 30.0–36.0)
MCV: 80.9 fL (ref 80.0–100.0)
Monocytes Absolute: 0.9 10*3/uL (ref 0.1–1.0)
Monocytes Relative: 8 %
Neutro Abs: 7.5 10*3/uL (ref 1.7–7.7)
Neutrophils Relative %: 66 %
Platelets: 673 10*3/uL — ABNORMAL HIGH (ref 150–400)
RBC: 4.34 MIL/uL (ref 3.87–5.11)
RDW: 17.2 % — ABNORMAL HIGH (ref 11.5–15.5)
WBC: 11.4 10*3/uL — ABNORMAL HIGH (ref 4.0–10.5)
nRBC: 0.4 % — ABNORMAL HIGH (ref 0.0–0.2)

## 2024-01-22 LAB — RENAL FUNCTION PANEL
Albumin: 2.3 g/dL — ABNORMAL LOW (ref 3.5–5.0)
Anion gap: 12 (ref 5–15)
BUN: 8 mg/dL (ref 8–23)
CO2: 27 mmol/L (ref 22–32)
Calcium: 8.6 mg/dL — ABNORMAL LOW (ref 8.9–10.3)
Chloride: 101 mmol/L (ref 98–111)
Creatinine, Ser: 1.28 mg/dL — ABNORMAL HIGH (ref 0.44–1.00)
GFR, Estimated: 41 mL/min — ABNORMAL LOW (ref >60)
Glucose, Bld: 89 mg/dL (ref 70–99)
Phosphorus: 1.7 mg/dL — ABNORMAL LOW (ref 2.5–4.6)
Potassium: 3 mmol/L — ABNORMAL LOW (ref 3.5–5.1)
Sodium: 140 mmol/L (ref 135–145)

## 2024-01-22 LAB — ECHOCARDIOGRAM COMPLETE
AR max vel: 2.09 cm2
AV Area VTI: 2.07 cm2
AV Area mean vel: 2.03 cm2
AV Mean grad: 7.7 mmHg
AV Peak grad: 15.4 mmHg
Ao pk vel: 1.96 m/s
Area-P 1/2: 3.24 cm2
Height: 61 in
MV VTI: 3.9 cm2
P 1/2 time: 686 ms
S' Lateral: 3.1 cm
Weight: 2215.18 [oz_av]

## 2024-01-22 LAB — TYPE AND SCREEN
ABO/RH(D): O POS
Antibody Screen: NEGATIVE
Unit division: 0
Unit division: 0

## 2024-01-22 LAB — GLUCOSE, CAPILLARY
Glucose-Capillary: 108 mg/dL — ABNORMAL HIGH (ref 70–99)
Glucose-Capillary: 115 mg/dL — ABNORMAL HIGH (ref 70–99)
Glucose-Capillary: 125 mg/dL — ABNORMAL HIGH (ref 70–99)
Glucose-Capillary: 94 mg/dL (ref 70–99)

## 2024-01-22 LAB — SURGICAL PATHOLOGY

## 2024-01-22 MED ORDER — POTASSIUM CHLORIDE 10 MEQ/100ML IV SOLN
10.0000 meq | INTRAVENOUS | Status: AC
Start: 2024-01-22 — End: 2024-01-22
  Administered 2024-01-22 (×4): 10 meq via INTRAVENOUS
  Filled 2024-01-22 (×4): qty 100

## 2024-01-22 MED ORDER — METOPROLOL TARTRATE 5 MG/5ML IV SOLN
7.5000 mg | Freq: Four times a day (QID) | INTRAVENOUS | Status: DC
  Administered 2024-01-22 – 2024-01-29 (×28): 7.5 mg via INTRAVENOUS
  Filled 2024-01-22 (×31): qty 10

## 2024-01-22 MED ORDER — LEVOTHYROXINE SODIUM 100 MCG/5ML IV SOLN: 50.0000 ug | Freq: Every day | INTRAVENOUS | Status: DC

## 2024-01-22 MED ORDER — POTASSIUM PHOSPHATES 15 MMOLE/5ML IV SOLN
30.0000 mmol | Freq: Once | INTRAVENOUS | Status: AC
  Administered 2024-01-22: 30 mmol via INTRAVENOUS
  Filled 2024-01-22: qty 10

## 2024-01-22 MED ORDER — DEXTROSE-SODIUM CHLORIDE 5-0.9 % IV SOLN: INTRAVENOUS | Status: AC

## 2024-01-22 MED ORDER — MAGNESIUM SULFATE IN D5W 1-5 GM/100ML-% IV SOLN
1.0000 g | Freq: Once | INTRAVENOUS | Status: AC
  Administered 2024-01-22: 1 g via INTRAVENOUS
  Filled 2024-01-22: qty 100

## 2024-01-22 MED ORDER — METOPROLOL TARTRATE 5 MG/5ML IV SOLN: 5.0000 mg | Freq: Four times a day (QID) | INTRAVENOUS | Status: DC

## 2024-01-22 MED ORDER — MAGNESIUM SULFATE 2 GM/50ML IV SOLN
2.0000 g | Freq: Once | INTRAVENOUS | Status: AC
  Administered 2024-01-22: 2 g via INTRAVENOUS
  Filled 2024-01-22: qty 50

## 2024-01-22 MED ORDER — TRAVASOL 10 % IV SOLN
INTRAVENOUS | Status: AC
  Filled 2024-01-22: qty 451.2

## 2024-01-22 NOTE — Plan of Care (Signed)

## 2024-01-22 NOTE — Progress Notes (Signed)
 PHARMACY - TOTAL PARENTERAL NUTRITION CONSULT NOTE   Indication: Prolonged ileus  Patient Measurements: Height: 5\' 1"  (154.9 cm) Weight: 62.8 kg (138 lb 7.2 oz) IBW/kg (Calculated) : 47.8 TPN AdjBW (KG): 57.6 Body mass index is 26.16 kg/m.  Assessment:  35 YOF presenting with SBO with ileocecal/colon mass, s/p hemicolectomy 3/14, prolonged ileus post-op with increased abdominal distention   Glucose / Insulin: CBGs 80-90s, 3/14 A1c 5.7 (metformin PTA) Electrolytes: K 3 - replacement ordered, Phos 1.7 - replacement ordered, Mg 1.6 - 3g IV given Renal: SCr 1.28, BUN 8 - IVF@20  Hepatic: 3/13 LFTs wnl, no recent TG Intake / Output; MIVF: NG 200 mL, no UOP recorded GI Imaging: 3/13 CT abd/pelvis - SBO with large ileocecal/ascending colon mass, concern for malignancy GI Surgeries / Procedures: 3/14 colectomy  Central access: PICC (double lumen) placed 3/17 TPN start date: 3/18  Nutritional Goals: Goal TPN rate is 80 mL/hr  RD Assessment: Pending    Current Nutrition:  NPO  Plan:  Start TPN at 40 mL/hr at 1800  Intralipid d/t allergy potential  Electrolytes in TPN: Na 22mEq/L, K 62mEq/L, Ca 23mEq/L, Mg 36mEq/L, and Phos 89mmol/L. Cl:Ac 1:1 Add standard MVI and trace elements to TPN Initiate Sensitive q6h SSI and adjust as needed  D/c D5/NS gtt (84mL/hr) with TPN start TPN labs in AM including Mg/Phos/TG  Daylene Posey, PharmD, [facility] Clinical Pharmacist ED Pharmacist Phone # [phone] 01/22/2024 11:38 AM

## 2024-01-22 NOTE — Progress Notes (Signed)
  4 Days Post-Op   Chief Complaint/Subjective: NG had to be re-advanced last night.   States her pain is controlled. Denies nausea. Reports small amount of flatus and, per daughter, a dark black stool. She also has increased distention today compared to yesterday.    Objective: Vital signs in last 24 hours: Temp:  [97.8 F (36.6 C)-99.1 F (37.3 C)] 98.7 F (37.1 C) (03/18 0516) Pulse Rate:  [69-103] 82 (03/18 0638) Resp:  [16-18] 16 (03/18 0516) BP: (137-189)/(77-103) 176/100 (03/18 0638) SpO2:  [92 %-100 %] 96 % (03/18 7829) Last BM Date : 01/17/24 Intake/Output from previous day: 03/17 0701 - 03/18 0700 In: 1011.4 [I.V.:182.1; Blood:829.3] Out: 200 [Emesis/NG output:200]  PE: Gen: elderly black female, NAD. Resp: nonlabored Card: RRR Abd: soft, incision clean dry and intact, distended  NG in place, hooked up incorrectly to blue port. I fixed this and flushed the tube.   Lab Results:  Recent Labs    01/21/24 0646 01/22/24 0323  WBC 8.9 11.4*  HGB 7.2* 11.4*  HCT 23.3* 35.1*  PLT 546* 673*   Recent Labs    01/21/24 0646 01/22/24 0323  NA 140 140  K 2.8* 3.0*  CL 104 101  CO2 24 27  GLUCOSE 165* 89  BUN 15 8  CREATININE 1.28* 1.28*  CALCIUM 8.3* 8.6*   No results for input(s): "LABPROT", "INR" in the last 72 hours.    Component Value Date/Time   NA 140 01/22/2024 0323   K 3.0 (L) 01/22/2024 0323   CL 101 01/22/2024 0323   CO2 27 01/22/2024 0323   GLUCOSE 89 01/22/2024 0323   BUN 8 01/22/2024 0323   CREATININE 1.28 (H) 01/22/2024 0323   CREATININE 1.26 (H) 11/13/2023 1248   CALCIUM 8.6 (L) 01/22/2024 0323   PROT 6.5 01/17/2024 1830   ALBUMIN 2.3 (L) 01/22/2024 0323   AST 20 01/17/2024 1830   AST 16 11/13/2023 1248   ALT 9 01/17/2024 1830   ALT 8 11/13/2023 1248   ALKPHOS 53 01/17/2024 1830   BILITOT 0.5 01/17/2024 1830   BILITOT 0.5 11/13/2023 1248   GFRNONAA 41 (L) 01/22/2024 0323   GFRNONAA 42 (L) 11/13/2023 1248   GFRAA 48 (L) 12/02/2018  1255    Assessment/Plan  s/p Procedure(s): COLECTOMY, PARTIAL LAPAROTOMY, EXPLORATORY 01/18/2024  - path: colonic adenocarcinoma, negative margins, 0/13 LN; these results were discussed with the patient and her daughter. Oncology has been notified and will see the patient. - afebrile, WBC 11.4 from 8.9, monitor  - continue NG tube to suction and await futher bowel function; early BMs may be black from old blood that was present during surgery. Monitor.  - s/p 2 units pRBC 3/17, hgb is 11.4 this AM from 7.2 yesterday - PICC placed 3/17, start TPN today   FEN - NPO, NG LIWS, ice chips/sips for comfort ok  VTE - heparin 5000 Crisman ID - ceftriaxone completed for UTI  Disposition - ileus after bowel surgery   LOS: 5 days   I reviewed last 24 h vitals and pain scores, last 48 h intake and output, last 24 h labs and trends, and last 24 h imaging results.  This care required high  level of medical decision making.   Francine Graven [facility] at [facility] 01/22/2024, 9:49 AM Please see Amion for pager number during day hours 7:00am-4:30pm or 7:00am -11:30am on weekends

## 2024-01-22 NOTE — Progress Notes (Signed)
 Initial Nutrition Assessment  DOCUMENTATION CODES:   Not applicable  INTERVENTION:   Diet advancement per MD  TPN per pharmacy:  Pharmacy to add renal MVI and 100 mg Thiamine to TPN  Recommend updated weight   Monitor magnesium, potassium, and phosphorus daily for at least 3 days, MD to replete as needed, as pt is at risk for refeeding syndrome .  NUTRITION DIAGNOSIS:   Inadequate oral intake related to cancer and cancer related treatments as evidenced by NPO status.   GOAL:   Patient will meet greater than or equal to 90% of their needs   MONITOR:   Diet advancement, Labs, Weight trends, I & O's  REASON FOR ASSESSMENT:   Consult New TPN/TNA  ASSESSMENT:  88 y.o female with PMH of T2DM, CKD stage 3a, HTN, HLD, hypothyroidism, CHF, anemia. Presented with abdominal pain along with nausea and vomiting. Found to have small bowel obstruction secondary to ileocecal colonic mass-suspected for malignancy.  3/13 NGT placed, CT abdomen and pelvis showed SBO with large ileocecal/ascending colon mass, concern for malignancy  3/14 -hemicolectomy with ileocolic anastomosis  3/18 - TPN initiated, pathology confirms colonic adenocarcinoma   RD working remotely, no answer when calling patient's room. History obtained through chart review.   Patient had abdominal discomfort and vomiting 3 days before admission. These episodes have been recurrent for some time but they have progressively been getting worse. She lives with her daughter but takes care of herself independently. Patient reports she lost a few pounds this year. Weights from this admission concerning for weight loss however question most recent weight for accuracy. Messaged RN to get updated weight.   TPN to start today due to prolonged ileus post-op. Had some flatus this morning as well as a small dark black stool. Has NGT to LIWS. Receiving dextrose. Patient has had poor intake for 8 days now, including 3 days PTA, and will be  at severe refeeding risk. Patients lytes have already been trending low, MD repleting.   Admit weight: 58.3 kg Current weight: 62.8 kg     Intake/Output Summary (Last 24 hours) at 01/22/2024 1433 Last data filed at 01/22/2024 0241 Gross per 24 hour  Intake 1011.43 ml  Output 200 ml  Net 811.43 ml   Drains/Lines: NGT to LIWS: 200 ml x 24 hours   Average Meal Intake: NPO  Nutritionally Relevant Medications: Scheduled Meds:  insulin aspart  0-9 Units Subcutaneous Q6H   [START ON 01/29/2024] levothyroxine  50 mcg Intravenous Daily   lidocaine  1 patch Transdermal Q24H   metoprolol tartrate  7.5 mg Intravenous Q6H   pantoprazole (PROTONIX) IV  40 mg Intravenous Q12H   Continuous Infusions:  dextrose 5 % and 0.9 % NaCl 20 mL/hr at 01/22/24 0846   potassium PHOSPHATE IVPB (in mmol) 30 mmol (01/22/24 1002)   TPN ADULT (ION)     Labs Reviewed: Potassium 3, Phosphorus 1.7, Magnesium 1.6, Creatinine 1.28, Calcium 8.6 CBG ranges from 72-190 mg/dL over the last 24 hours HgbA1c 5.7    NUTRITION - FOCUSED PHYSICAL EXAM:  -deferred to follow up   Diet Order:   Diet Order             Diet NPO time specified Except for: Ice Chips, Other (See Comments)  Diet effective now                   EDUCATION NEEDS:   No education needs have been identified at this time  Skin:  Skin Assessment: Skin  Integrity Issues: Skin Integrity Issues:: Incisions Incisions: Abdomen  Last BM:  01/22/24, small  Height:   Ht Readings from Last 1 Encounters:  01/18/24 5\' 1"  (1.549 m)    Weight:   Wt Readings from Last 1 Encounters:  01/21/24 62.8 kg    Ideal Body Weight:  47.8 kg  BMI:  Body mass index is 26.16 kg/m.  Estimated Nutritional Needs:   Kcal:  1500-1700 kcal  Protein:  65-85 gm  Fluid:  >1.5L/day   Elliot Dally, RD Registered Dietitian  See Amion for more information

## 2024-01-22 NOTE — Progress Notes (Addendum)
 [name]   DOB:[birth year]   ZO#:[id number]      ASSESSMENT & PLAN:  Colon adenocarcinoma, moderately differentiated with metastatic adenopathy, splenic masses -Patient admitted 01/17/2024 with complaints of abdominal pain associated with nausea and vomiting. - CT abdomen pelvis done 01/17/2024 shows findings consistent with SBO secondary to large ileocecal/ascending colon mass 7.3 cm, findings concerning for colon malignancy.  There were enlarged mesenteric nodes suspicious for metastatic adenopathy.  Indeterminate splenic masses up to 2.7 cm. - Status post partial colectomy done 01/18/2024.  Pathology confirms colonic adenocarcinoma. - Medical oncology/Dr. [name] will see patient today with plan to be followed up in outpatient oncology by Dr. Mosetta Putt.  Small bowel obstruction Postop ileus -likely due to malignancy - NG tube intact, intermittent wall suction - Patient is n.p.o. - Seen by general surgery  Thrombocytosis - Patient with longstanding history of thrombocytosis - Platelets elevated 673K today. - Continue to monitor CBC with differential  Anemia Iron deficiency anemia - Likely acute blood loss and malignancy - Hemoglobin stable 11.4 - No transfusional intervention at this time.  No IV iron recommended in view of sepsis/UTI - Continue to monitor CBC with differential  Diabetes Hypertension Hyperlipidemia - Continue to monitor blood sugar levels - Continue to monitor blood pressure - On statin which is on hold at this time - Medicine following  AKI - Creatinine in the mid 1 range, currently 1.28 - Avoid nephrotoxic agents - Continue gentle hydration as patient is n.p.o. - Monitor CMP    Code Status Full  Subjective:  Patient seen awake and alert.  Ill-appearing.  NG tube intact with intermittent wall suction noted.  She is currently n.p.o.  Patient's daughter at bedside.  No acute distress is noted.   Objective:  Vitals:   01/22/24 0638 01/22/24 1130  BP: (!)  176/100 (!) 179/105  Pulse: 82 61  Resp:  18  Temp: 98.4 F (36.9 C) 97.7 F (36.5 C)  SpO2: 96% 97%     Intake/Output Summary (Last 24 hours) at 01/22/2024 1254 Last data filed at 01/22/2024 0241 Gross per 24 hour  Intake 1011.43 ml  Output 200 ml  Net 811.43 ml     REVIEW OF SYSTEMS:   Constitutional: + Fatigue, denies fevers, chills or abnormal night sweats Eyes: Denies blurriness of vision, double vision or watery eyes Ears, nose, mouth, throat, and face: Denies mucositis or sore throat Respiratory: Denies cough, dyspnea or wheezes Cardiovascular: Denies palpitation, chest discomfort or lower extremity swelling Gastrointestinal: + Abdominal pain  skin: Denies abnormal skin rashes Lymphatics: Denies new lymphadenopathy or easy bruising Neurological: Denies numbness, tingling or new weaknesses Behavioral/Psych: Mood is stable, no new changes  All other systems were reviewed with the patient and are negative.  PHYSICAL EXAMINATION: ECOG PERFORMANCE STATUS: 3 - Symptomatic, >50% confined to bed  Vitals:   01/22/24 0638 01/22/24 1130  BP: (!) 176/100 (!) 179/105  Pulse: 82 61  Resp:  18  Temp: 98.4 F (36.9 C) 97.7 F (36.5 C)  SpO2: 96% 97%   Filed Weights   01/18/24 0444 01/18/24 1013 01/21/24 0500  Weight: 128 lb 8.5 oz (58.3 kg) 127 lb (57.6 kg) 138 lb 7.2 oz (62.8 kg)    GENERAL: alert, + somnolent, no distress and comfortable SKIN: skin color, texture, turgor are normal, no rashes or significant lesions EYES: normal, conjunctiva are pink and non-injected, sclera clear OROPHARYNX: no exudate, no erythema and lips, buccal mucosa, and tongue normal  NECK: supple, thyroid normal size, non-tender, without nodularity  LYMPH: no palpable lymphadenopathy in the cervical, axillary or inguinal LUNGS: clear to auscultation and percussion with normal breathing effort HEART: regular rate & rhythm and no murmurs and no lower extremity edema ABDOMEN: + Abdominal  tenderness MUSCULOSKELETAL: no cyanosis of digits and no clubbing  PSYCH: alert & oriented x 3 with fluent speech NEURO: no focal motor/sensory deficits   All questions were answered. The patient knows to call the clinic with any problems, questions or concerns.   The total time spent in the appointment was 40 minutes encounter with patient including review of chart and various tests results, discussions about plan of care and coordination of care plan  Dawson Bills, NP 01/22/2024 12:54 PM    Labs Reviewed:  Lab Results  Component Value Date   WBC 11.4 (H) 01/22/2024   HGB 11.4 (L) 01/22/2024   HCT 35.1 (L) 01/22/2024   MCV 80.9 01/22/2024   PLT 673 (H) 01/22/2024   Recent Labs    09/04/23 0840 11/13/23 1248 01/17/24 1830 01/18/24 0456 01/19/24 0708 01/20/24 0415 01/21/24 0646 01/22/24 0323  NA 140 138 140   < > 139 139 140 140  K 3.8 4.1 3.2*   < > 3.7 3.7 2.8* 3.0*  CL 104 101 105   < > 106 107 104 101  CO2 29 30 25    < > 25 20* 24 27  GLUCOSE 140* 77 120*   < > 127* 106* 165* 89  BUN 23 20 19    < > 19 23 15 8   CREATININE 1.39* 1.26* 1.07*   < > 1.69* 1.67* 1.28* 1.28*  CALCIUM 9.3 9.2 8.9   < > 8.1* 8.5* 8.3* 8.6*  GFRNONAA 37* 42* 51*   < > 29* 30* 41* 41*  PROT 7.3 7.2 6.5  --   --   --   --   --   ALBUMIN 3.7 3.6 2.8*  --  2.4*  --   --  2.3*  AST 17 16 20   --   --   --   --   --   ALT 11 8 9   --   --   --   --   --   ALKPHOS 65 62 53  --   --   --   --   --   BILITOT 0.5 0.5 0.5  --   --   --   --   --    < > = values in this interval not displayed.   Addendum: Attending Note  I personally saw the patient, reviewed the chart and examined the patient. The plan of care was discussed with the patient and the admitting team. I agree with the assessment and plan as documented above. Thank you very much for the consultation. Proximal colon cancer: Status post laparotomy and hemicolectomy on 01/18/2024.  Pathology revealed 13 cm of colon adenocarcinoma with  pericolonic connective tissue invasion, 0/13 lymph nodes negative, negative for lymphovascular invasion T3 N0 stage IIA (high risk feature: Obstruction) Treatment plan: Oral capecitabine can be considered as adjuvant treatment once the patient recovers from surgery. Dr. Mosetta Putt will see the patient and determine if she is eligible to receive capecitabine at a later time. Essential thrombocytosis: On anagrelide therapy previously as an outpatient.  Platelet count stable at 673

## 2024-01-22 NOTE — Progress Notes (Addendum)
 VSS, continues on room air. Sinus rhythm on the telemetry. Denies pain or discomfort. X-ray resulted and NG tube to Low intermittent suction in place.  Completed blood transfusion and pt tolerated well. Will continue to monitor.  Safety maintained. Will continue to monitor.

## 2024-01-22 NOTE — Progress Notes (Signed)
 PROGRESS NOTE    [name]  JXB:[id number] DOB: 1935-11-18 DOA: 01/17/2024 PCP: Jackie Plum, MD    Chief Complaint  Patient presents with   Abdominal Pain   Nausea    Brief Narrative:  [name] is a 88 y.o. 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  General surgery consulted and patient to the OR today 01/18/2024.   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  Patient seen in the ED. -CT abdomen and pelvis done consistent with small bowel obstruction secondary to large ileocecal/ascending colonic mass measuring up to 7.3 cm.  No perforation noted.  Enlarged mesenteric nodes in the vicinity of the colon mass suspicious for metastatic adenopathy.  Indeterminate splenic masses measuring up to 2.7 cm centimeters. -NG tube placed. -Patient seen in consultation by general surgery who assessed patient and patient underwent open right hemicolectomy with ileocolic anastomosis on 01/18/2024.   -Pathology consistent with colonic adenocarcinoma, 13 cm extending into pericolonic connective.  Negative surgical margins noted.  13 lymph nodes  negative for metastatic carcinoma.  Negative for lymphovascular involvement.  7 separate tubular adenomas without high-grade dysplasia noted. -Currently n.p.o. with bowel rest.   -Awaiting bowel function. -Mobilize.  -CT scan reviewed by general surgery and abdominal findings noted on CT scan as expected for postop day #2 for laparotomy per general surgeon. -Will need consultation with oncology for further evaluation and recommendations.  Patient noted to be followed by Dr. [name] in the outpatient setting. -Per general surgery.  2.  Postop ileus -Patient with postop ileus. -Patient stated had some flatus this morning as well as small bowel movement per patient and daughter at bedside.   -Keep electrolytes repleted potassium approximately 4, magnesium approximately 2.   -Mobilize.   -Per general surgery.   3.  Atypical chest pain -Patient noted with complaints of atypical chest pain localized on the left as well as left upper abdominal pain and nonradiating on 01/19/2024 -Patient describes the pain as a gas bubble. -Patient with somewhat reproducible chest pain. -EKG with no ischemic changes noted.   -Cardiac enzymes negative.   -Chest x-ray with increased density in the AP window which may reflect a dilated pulmonary artery, descending thoracic aortic aneurysm or lymphadenopathy.  CT chest recommended.  Stable enlarged cardiac silhouette.  No acute airspace disease.   -CT chest obtained without contrast with marked main pulmonary artery enlargement compatible with pulmonary hypertension relating to radiographic abnormality.  Aorta elongated and tortuous.  No mass or adenopathy in the chest.  Dependent atelectasis with small volume pleural fluid noted.   -Clinical improvement.   -Continue IV PPI twice daily, Voltaren gel, simethicone.  -2D echo ordered and pending. -Supportive care.  4.  Iron deficiency anemia/acute postop anemia -Hemoglobin  currently at 11.4 posttransfusion of 2 units  PRBCs (01/21/2024 ) from 7.2 from 9.0.   -Likely postop acute blood loss anemia and also likely partly dilutional as patient placed on IV fluids. -Follow H&H. -Transfusion threshold hemoglobin < 8.  5.  Hypokalemia/hypomagnesemia/hypophosphatemia -Potassium at 3.0 this morning, phosphorus at 1.7, magnesium at 1.6.   -KCl 10 mEq IV every hour x 4 runs already placed.   -K-Phos 30 mmol IV x 1.   -Patient noted to have already received magnesium 1 g IV x 1 this morning.   -Will give magnesium 2 g IV x 1 with goal to keep magnesium approximately 2 due to postop ileus.  -Repeat labs in the AM.  6.  Probable UTI -Urinalysis moderate leukocytes, nitrite negative, rare bacteria, WBC 21-50. -Urine cultures with 30,000 colonies of Klebsiella pneumonia. -Status post 3 days IV Rocephin.   7.  AKI on CKD stage IIIa -Likely secondary to prerenal azotemia as patient presented with small bowel obstruction, nausea vomiting and has been n.p.o. since admission.   -Urine sodium, urine creatinine pending.   -Urine output not recorded.   -Patient however states is having urine output.   -Renal function improved with hydration.   8.  Well-controlled diabetes mellitus type 2 -Hemoglobin A1c 5.7. -CBG 94 this morning. -Patient currently NPO. -Continue to hold home regimen Glucophage. -Currently on D5 normal saline at 20 cc an hour as patient currently n.p.o. and noted to have some low blood glucose levels on 01/21/2024.   9.  Hypertension -Currently n.p.o. due to small bowel obstruction and unable to take oral intake. -Continue to hold home oral antihypertensive medications.   -Increase IV Lopressor to 7.5 mg every 6 hours.   10.  Essential thrombocytosis -Follows with hematology, Dr. [name] -Managed on anagrelide which is currently on hold. -Hematology/oncology notified via epic of admission.  11.  Hypothyroidism -Synthroid on hold as patient currently NPO. -Place on IV Synthroid.  12.   Hyperlipidemia -Continue to hold statin and could likely resume on discharge.   13.  Splenic masses -Indeterminate splenic masses measuring up to 2.7 cm noted on CT abdomen and pelvis. -May need a dedicated abdominal MRI for further evaluation once patient is clinically stable.   DVT prophylaxis: Heparin Code Status: Full Family Communication: Updated patient and daughter at bedside. Disposition: TBD  Status is: Inpatient Remains inpatient appropriate because: Severity of illness   Consultants:  General Surgery: Dr. Sheliah Hatch 01/18/2024  Procedures:  CT abdomen and pelvis 01/17/2024 Abdominal films 01/17/2024, 01/18/2024 Open right hemicolectomy with ileocolic anastomosis per general surgery: Dr. Allen/Dr. Violeta Gelinas CT chest 01/19/2024 Chest x-ray 01/19/2024 PICC line 01/21/2024 Transfuse 2 units PRBCs, 01/21/2024 2D echo pending  Antimicrobials:  Anti-infectives (From admission, onward)    Start     Dose/Rate Route Frequency Ordered Stop   01/18/24 0915  cefoTEtan (CEFOTAN) 2 g in sodium chloride 0.9 % 100 mL IVPB        2 g 200 mL/hr over 30 Minutes Intravenous On call to O.R. 01/18/24 0819 01/18/24 2042   01/18/24 0915  cefTRIAXone (ROCEPHIN) 2 g in sodium chloride 0.9 % 100 mL IVPB        2 g 200 mL/hr over 30 Minutes Intravenous Every 24 hours 01/18/24 0819 01/20/24 1011         Subjective: Patient laying in bed.  Overall feeling a little bit better.  Abdomen distended this morning.  Patient stated passing flatus.  Patient had small bowel movement per patient and daughter  this morning.  Patient denies any chest pain.  No shortness of breath.  No bleeding.  Daughter at bedside.   Objective: Vitals:   01/21/24 2321 01/22/24 0142 01/22/24 0516 01/22/24 0638  BP: (!) 159/94 (!) 154/100 (!) 189/89 (!) 176/100  Pulse: 77 69 89 82  Resp: 16 16 16    Temp: 98.6 F (37 C) 98.7 F (37.1 C) 98.7 F (37.1 C)   TempSrc: Oral Oral Oral   SpO2: 98% 95% 100% 96%  Weight:       Height:        Intake/Output Summary (Last 24 hours) at 01/22/2024 1033 Last data filed at 01/22/2024 0241 Gross per 24 hour  Intake 1011.43 ml  Output 200 ml  Net 811.43 ml   Filed Weights   01/18/24 0444 01/18/24 1013 01/21/24 0500  Weight: 58.3 kg 57.6 kg 62.8 kg    Examination:  General exam: NAD.  NG tube in place with an empty canister.  Respiratory system: CTAB.  No wheezes, no crackles, no rhonchi.  Fair air movement.  Speaking in full sentences.  Cardiovascular system: RRR no murmurs rubs or gallops.  No JVD.  No lower extremity edema.  Chest wall tenderness to palpation decreased. Gastrointestinal system: Abdomen is soft, distended, hypoactive bowel sounds, decreased diffuse tenderness to palpation.  Honeycomb dressing intact. Central nervous system: Alert and oriented. No focal neurological deficits. Extremities: Symmetric 5 x 5 power. Skin: No rashes, lesions or ulcers Psychiatry: Judgement and insight appear normal. Mood & affect appropriate.     Data Reviewed: I have personally reviewed following labs and imaging studies  CBC: Recent Labs  Lab 01/17/24 1714 01/18/24 0456 01/19/24 0708 01/20/24 0415 01/20/24 1509 01/21/24 0646 01/22/24 0323  WBC 11.9* 10.9* 12.5* 10.7*  --  8.9 11.4*  NEUTROABS 9.6*  --   --   --   --  6.6 7.5  HGB 9.9* 9.7* 9.0* 8.1* 7.9* 7.2* 11.4*  HCT 31.6* 30.4* 28.5* 26.3* 25.5* 23.3* 35.1*  MCV 81.4 79.8* 81.2 80.2  --  82.3 80.9  PLT 465* 478* 461* 392  --  546* 673*    Basic Metabolic Panel: Recent Labs  Lab 01/18/24 0456 01/19/24 0708 01/20/24 0415 01/21/24 0646 01/22/24 0323  NA 138 139 139 140 140  K 3.7 3.7 3.7 2.8* 3.0*  CL 106 106 107 104 101  CO2 25 25 20* 24 27  GLUCOSE 114* 127* 106* 165* 89  BUN 19 19 23 15 8   CREATININE 1.24* 1.69* 1.67* 1.28* 1.28*  CALCIUM 8.8* 8.1* 8.5* 8.3* 8.6*  MG 2.0 1.6* 2.3 2.1 1.6*  PHOS  --  5.4*  --   --  1.7*    GFR: Estimated Creatinine Clearance: 26.8 mL/min (A) (by  C-G formula based on SCr of 1.28 mg/dL (H)).  Liver Function Tests: Recent Labs  Lab 01/17/24 1830 01/19/24 0708 01/22/24 0323  AST 20  --   --   ALT 9  --   --   ALKPHOS 53  --   --   BILITOT 0.5  --   --   PROT 6.5  --   --   ALBUMIN 2.8* 2.4* 2.3*    CBG: Recent Labs  Lab 01/21/24 0618 01/21/24 1209 01/21/24 1648 01/21/24 2358 01/22/24 0606  GLUCAP 190* 72 78 108* 94     Recent Results (from the past 240 hours)  Urine Culture     Status: Abnormal   Collection Time: 01/17/24  8:10 PM   Specimen:  Urine, Clean Catch  Result Value Ref Range Status   Specimen Description URINE, CLEAN CATCH  Final   Special Requests   Final    NONE Performed at [facility], [address], [place], [place] [address]    Culture 30,000 COLONIES/mL KLEBSIELLA PNEUMONIAE (A)  Final   Report Status 01/19/2024 FINAL  Final   Organism ID, Bacteria KLEBSIELLA PNEUMONIAE (A)  Final      Susceptibility   Klebsiella pneumoniae - MIC*    AMPICILLIN RESISTANT Resistant     CEFAZOLIN <=4 SENSITIVE Sensitive     CEFEPIME <=0.12 SENSITIVE Sensitive     CEFTRIAXONE <=0.25 SENSITIVE Sensitive     CIPROFLOXACIN <=0.25 SENSITIVE Sensitive     GENTAMICIN <=1 SENSITIVE Sensitive     IMIPENEM <=0.25 SENSITIVE Sensitive     NITROFURANTOIN 128 RESISTANT Resistant     TRIMETH/SULFA <=20 SENSITIVE Sensitive     AMPICILLIN/SULBACTAM 4 SENSITIVE Sensitive     PIP/TAZO <=4 SENSITIVE Sensitive ug/mL    * 30,000 COLONIES/mL KLEBSIELLA PNEUMONIAE         Radiology Studies: DG Abd Portable 1V Result Date: 01/21/2024 CLINICAL DATA:  Nasogastric tube placement. EXAM: PORTABLE ABDOMEN - 1 VIEW COMPARISON:  Radiograph 01/18/2024 FINDINGS: Tip and side port of the enteric tube below the diaphragm in the stomach. Gaseous small bowel distention in the left abdomen. Midline skin staples in place. IMPRESSION: Tip and side port of the enteric tube below the diaphragm in the stomach. Electronically Signed   By:  Narda Rutherford M.D.   On: 01/21/2024 20:59   [place] EKG SITE RITE Result Date: 01/21/2024 If Site Rite image not attached, placement could not be confirmed due to current cardiac rhythm.       Scheduled Meds:  Chlorhexidine Gluconate Cloth  6 each Topical Daily   heparin  5,000 Units Subcutaneous Q8H   insulin aspart  0-9 Units Subcutaneous Q6H   [START ON 01/29/2024] levothyroxine  50 mcg Intravenous Daily   lidocaine  1 patch Transdermal Q24H   metoprolol tartrate  7.5 mg Intravenous Q6H   pantoprazole (PROTONIX) IV  40 mg Intravenous Q12H   simethicone  80 mg Oral QID   sodium chloride flush  10-40 mL Intracatheter Q12H   Continuous Infusions:  dextrose 5 % and 0.9 % NaCl 20 mL/hr at 01/22/24 0846   lactated ringers 100 mL/hr at 01/20/24 2357   potassium chloride 10 mEq (01/22/24 0950)   potassium PHOSPHATE IVPB (in mmol) 30 mmol (01/22/24 1002)     LOS: 5 days    Time spent: 40 minutes    Ramiro Harvest, MD [facility]   To contact the attending provider between 7A-7P or the covering provider during after hours 7P-7A, please log into the web site [email or web address] and access using universal [place] password for that web site. If you do not have the password, please call the hospital operator.  01/22/2024, 10:33 AM

## 2024-01-22 NOTE — Progress Notes (Signed)
   01/22/24 0516  Vitals  Temp 98.7 F (37.1 C)  Temp Source Oral  BP (!) 189/89  MAP (mmHg) 117  BP Location Left Arm  BP Method Automatic  Patient Position (if appropriate) Lying  Pulse Rate 89  Pulse Rate Source Monitor  ECG Heart Rate 89  Resp 16  MEWS COLOR  MEWS Score Color Green  Oxygen Therapy  SpO2 100 %  O2 Device Room Air  MEWS Score  MEWS Temp 0  MEWS Systolic 0  MEWS Pulse 0  MEWS RR 0  MEWS LOC 0  MEWS Score 0  Provider Notification  Provider Name/Title Karel Jarvis, MD  Date Provider Notified 01/22/24  Time Provider Notified [phone]  Method of Notification  (secure chat)  Notification Reason Other (Comment) (BP elevated and pt asymptomatic. scheduled IV metoprolol given)  Provider response No new orders  Date of Provider Response 01/22/24  Time of Provider Response 0555    Scheduled IV metoprolol given as ordered.  Morning labs K 3.0, Mg 1.6 and repeat Hgb 11.4- Dr. John Giovanni notified and new order for IV Mg and IV potassium.   NG tube in place and noted very scant output.  Will continue to monitor.

## 2024-01-22 NOTE — Progress Notes (Signed)
 Physical Therapy Treatment Patient Details Name: [name] MRN: [id number] DOB: 23-Feb-1936 Today's Date: 01/22/2024   History of Present Illness 88 yo female admitted 01/17/24 with abdominal pain, SBO. 3/14 open right hemicolectomy with ileocolic anastomosis. PMHx: T2DM, CKD, HTN, hypothyroidism, iron deficiency anemia, essential thrombocytosis, glaucoma.    PT Comments  Pt pleasant and eager to get OOB. Pt able to increase gait distance and perform repeated transfers. Therapist provided HEP education. Encouraged OOB and further mobility with staff.     If plan is discharge home, recommend the following: A little help with walking and/or transfers;A little help with bathing/dressing/bathroom;Assistance with cooking/housework;Assist for transportation;Help with stairs or ramp for entrance   Can travel by private vehicle        Equipment Recommendations  Rolling walker (2 wheels);BSC/3in1    Recommendations for Other Services       Precautions / Restrictions Precautions Precautions: Fall;Other (comment) Recall of Precautions/Restrictions: Intact Precaution/Restrictions Comments: NGT     Mobility  Bed Mobility Overal bed mobility: Needs Assistance Bed Mobility: Supine to Sit           General bed mobility comments: assist for lines with HOB 15 degrees    Transfers Overall transfer level: Needs assistance   Transfers: Sit to/from Stand, Bed to chair/wheelchair/BSC Sit to Stand: Contact guard assist Stand pivot transfers: Contact guard assist         General transfer comment: assist for lines. Pt able to pivot bed<> BSC without UB support and standing for pericare with single UE support. Pt performed 6 repeated sit to stands without UB support after gait    Ambulation/Gait Ambulation/Gait assistance: Contact guard assist Gait Distance (Feet): 100 Feet Assistive device: Rolling walker (2 wheels) Gait Pattern/deviations: Step-through pattern, Decreased stride length,  Trunk flexed   Gait velocity interpretation: 1.31 - 2.62 ft/sec, indicative of limited community ambulator   General Gait Details: cues for continued proximity to RW, pt able to self-regulate distance   Stairs             Wheelchair Mobility     Tilt Bed    Modified Rankin (Stroke Patients Only)       Balance Overall balance assessment: Needs assistance Sitting-balance support: Feet supported, No upper extremity supported Sitting balance-Leahy Scale: Fair     Standing balance support: Bilateral upper extremity supported, During functional activity, Reliant on assistive device for balance, Single extremity supported Standing balance-Leahy Scale: Poor Standing balance comment: UB support                            Communication Communication Communication: No apparent difficulties  Cognition Arousal: Alert Behavior During Therapy: WFL for tasks assessed/performed   PT - Cognitive impairments: No apparent impairments                         Following commands: Intact      Cueing Cueing Techniques: Verbal cues  Exercises General Exercises - Lower Extremity Long Arc Quad: AROM, Both, 15 reps, Seated, Strengthening Other Exercises Other Exercises: standing hip extension, AROM, 15 reps    General Comments        Pertinent Vitals/Pain Pain Assessment Pain Assessment: No/denies pain    Home Living                          Prior Function  PT Goals (current goals can now be found in the care plan section) Progress towards PT goals: Progressing toward goals    Frequency    Min 2X/week      PT Plan      Co-evaluation              AM-PAC PT "6 Clicks" Mobility   Outcome Measure  Help needed turning from your back to your side while in a flat bed without using bedrails?: None Help needed moving from lying on your back to sitting on the side of a flat bed without using bedrails?: A Little Help  needed moving to and from a bed to a chair (including a wheelchair)?: A Little Help needed standing up from a chair using your arms (e.g., wheelchair or bedside chair)?: A Little Help needed to walk in hospital room?: A Little Help needed climbing 3-5 steps with a railing? : A Lot 6 Click Score: 18    End of Session   Activity Tolerance: Patient tolerated treatment well Patient left: in chair;with call bell/phone within reach Nurse Communication: Mobility status PT Visit Diagnosis: Other abnormalities of gait and mobility (R26.89)     Time: 1032-1100 PT Time Calculation (min) (ACUTE ONLY): 28 min  Charges:    $Gait Training: 8-22 mins $Therapeutic Exercise: 8-22 mins PT General Charges $$ ACUTE PT VISIT: 1 Visit                     [name], PT Acute Rehabilitation Services Office: [phone]    [name] [name] 01/22/2024, 11:21 AM

## 2024-01-22 NOTE — Progress Notes (Signed)
 Pt is A&O x 4. BP elevated -see vitals charted and scheduled meds given as ordered. Continues on room air. Sinus rhythm on the telemetry monitor.   NG tube in place- clamped at this time and waiting for xray results.  Medications given as ordered - see emar. Safety maintained. Daughter at bedside. Call bell in reach.  Will continue to monitor.

## 2024-01-23 ENCOUNTER — Inpatient Hospital Stay (HOSPITAL_COMMUNITY)

## 2024-01-23 ENCOUNTER — Other Ambulatory Visit: Payer: Self-pay

## 2024-01-23 DIAGNOSIS — K56609 Unspecified intestinal obstruction, unspecified as to partial versus complete obstruction: Secondary | ICD-10-CM | POA: Diagnosis not present

## 2024-01-23 DIAGNOSIS — N179 Acute kidney failure, unspecified: Secondary | ICD-10-CM | POA: Diagnosis not present

## 2024-01-23 DIAGNOSIS — K9189 Other postprocedural complications and disorders of digestive system: Secondary | ICD-10-CM | POA: Diagnosis not present

## 2024-01-23 DIAGNOSIS — N1831 Chronic kidney disease, stage 3a: Secondary | ICD-10-CM | POA: Diagnosis not present

## 2024-01-23 LAB — COMPREHENSIVE METABOLIC PANEL
ALT: 18 U/L (ref 0–44)
AST: 19 U/L (ref 15–41)
Albumin: 2.2 g/dL — ABNORMAL LOW (ref 3.5–5.0)
Alkaline Phosphatase: 50 U/L (ref 38–126)
Anion gap: 10 (ref 5–15)
BUN: 8 mg/dL (ref 8–23)
CO2: 25 mmol/L (ref 22–32)
Calcium: 8.3 mg/dL — ABNORMAL LOW (ref 8.9–10.3)
Chloride: 101 mmol/L (ref 98–111)
Creatinine, Ser: 1.21 mg/dL — ABNORMAL HIGH (ref 0.44–1.00)
GFR, Estimated: 44 mL/min — ABNORMAL LOW (ref >60)
Glucose, Bld: 162 mg/dL — ABNORMAL HIGH (ref 70–99)
Potassium: 3.5 mmol/L (ref 3.5–5.1)
Sodium: 136 mmol/L (ref 135–145)
Total Bilirubin: 0.5 mg/dL (ref 0.0–1.2)
Total Protein: 5.8 g/dL — ABNORMAL LOW (ref 6.5–8.1)

## 2024-01-23 LAB — RENAL FUNCTION PANEL
Albumin: 2.3 g/dL — ABNORMAL LOW (ref 3.5–5.0)
Anion gap: 10 (ref 5–15)
BUN: 12 mg/dL (ref 8–23)
CO2: 26 mmol/L (ref 22–32)
Calcium: 8.3 mg/dL — ABNORMAL LOW (ref 8.9–10.3)
Chloride: 101 mmol/L (ref 98–111)
Creatinine, Ser: 1.28 mg/dL — ABNORMAL HIGH (ref 0.44–1.00)
GFR, Estimated: 41 mL/min — ABNORMAL LOW (ref >60)
Glucose, Bld: 155 mg/dL — ABNORMAL HIGH (ref 70–99)
Phosphorus: 3.7 mg/dL (ref 2.5–4.6)
Potassium: 3.5 mmol/L (ref 3.5–5.1)
Sodium: 137 mmol/L (ref 135–145)

## 2024-01-23 LAB — CBC WITH DIFFERENTIAL/PLATELET
Abs Immature Granulocytes: 0.2 10*3/uL — ABNORMAL HIGH (ref 0.00–0.07)
Basophils Absolute: 0.1 10*3/uL (ref 0.0–0.1)
Basophils Relative: 1 %
Eosinophils Absolute: 0.6 10*3/uL — ABNORMAL HIGH (ref 0.0–0.5)
Eosinophils Relative: 6 %
HCT: 35.7 % — ABNORMAL LOW (ref 36.0–46.0)
Hemoglobin: 11.5 g/dL — ABNORMAL LOW (ref 12.0–15.0)
Immature Granulocytes: 2 %
Lymphocytes Relative: 22 %
Lymphs Abs: 2 10*3/uL (ref 0.7–4.0)
MCH: 26.3 pg (ref 26.0–34.0)
MCHC: 32.2 g/dL (ref 30.0–36.0)
MCV: 81.7 fL (ref 80.0–100.0)
Monocytes Absolute: 0.9 10*3/uL (ref 0.1–1.0)
Monocytes Relative: 11 %
Neutro Abs: 5.2 10*3/uL (ref 1.7–7.7)
Neutrophils Relative %: 58 %
Platelets: 721 10*3/uL — ABNORMAL HIGH (ref 150–400)
RBC: 4.37 MIL/uL (ref 3.87–5.11)
RDW: 17.6 % — ABNORMAL HIGH (ref 11.5–15.5)
WBC: 8.9 10*3/uL (ref 4.0–10.5)
nRBC: 0.6 % — ABNORMAL HIGH (ref 0.0–0.2)

## 2024-01-23 LAB — GLUCOSE, CAPILLARY
Glucose-Capillary: 137 mg/dL — ABNORMAL HIGH (ref 70–99)
Glucose-Capillary: 168 mg/dL — ABNORMAL HIGH (ref 70–99)
Glucose-Capillary: 170 mg/dL — ABNORMAL HIGH (ref 70–99)
Glucose-Capillary: 180 mg/dL — ABNORMAL HIGH (ref 70–99)

## 2024-01-23 LAB — MAGNESIUM: Magnesium: 2.2 mg/dL (ref 1.7–2.4)

## 2024-01-23 LAB — PHOSPHORUS: Phosphorus: 3.8 mg/dL (ref 2.5–4.6)

## 2024-01-23 LAB — TRIGLYCERIDES: Triglycerides: 122 mg/dL (ref <150)

## 2024-01-23 MED ORDER — POTASSIUM CHLORIDE 10 MEQ/100ML IV SOLN
10.0000 meq | INTRAVENOUS | Status: AC
  Administered 2024-01-23 (×2): 10 meq via INTRAVENOUS
  Filled 2024-01-23 (×2): qty 100

## 2024-01-23 MED ORDER — TRACE MINERALS CU-MN-SE-ZN 300-55-60-3000 MCG/ML IV SOLN
INTRAVENOUS | Status: DC
  Filled 2024-01-23: qty 846

## 2024-01-23 MED ORDER — DEXTROSE-SODIUM CHLORIDE 5-0.9 % IV SOLN
INTRAVENOUS | Status: AC
Start: 2024-01-23 — End: 2024-01-24

## 2024-01-23 MED ORDER — TRAVASOL 10 % IV SOLN
INTRAVENOUS | Status: AC
  Filled 2024-01-23: qty 846

## 2024-01-23 NOTE — Plan of Care (Signed)
   Problem: Education: Goal: Knowledge of General Education information will improve Description: Including pain rating scale, medication(s)/side effects and non-pharmacologic comfort measures Outcome: Progressing   Problem: Safety: Goal: Ability to remain free from injury will improve Outcome: Progressing   Problem: Skin Integrity: Goal: Risk for impaired skin integrity will decrease Outcome: Progressing

## 2024-01-23 NOTE — Progress Notes (Signed)
 Received a call from Radiology about patient's NG-tube not in the right place for the 2nd time. On call provider was notified, received orders to remove the NG tube for IR to insert in the morning. Will continue to monitor.

## 2024-01-23 NOTE — Progress Notes (Addendum)
 [facility] night cross cover note:   I was notified by RN that the patient experienced a very brief episode of chest discomfort while moving from her chair to her bed.  It was self-limited, lasting a few seconds, without subsequent recurrence.  No report of any associated symptoms.  Vital signs have been stable with most recent set of vital signs show heart rates in the 90s, blood pressure 128/72, respiratory rate is 18 and oxygen saturation 100% on room air.  She remains afebrile.  EKG performed at this time shows sinus tachycardia with PACs, heart rate 102, no evidence of acute ischemic changes, including no evidence of ST elevation. Patient is chest pain-free at this time.      Newton Pigg, DO Hospitalist

## 2024-01-23 NOTE — Progress Notes (Signed)
 Occupational Therapy Treatment Patient Details Name: [name] MRN: [id number] DOB: 1936/04/15 Today's Date: 01/23/2024   History of present illness 88 yo female admitted 01/17/24 with abdominal pain, SBO. 3/14 open right hemicolectomy with ileocolic anastomosis. PMHx: T2DM, CKD, HTN, hypothyroidism, iron deficiency anemia, essential thrombocytosis, glaucoma.   OT comments  Pt is making continued progress towards acute OT goals. Pt continues to be limited by deficits below. Session focused on improving pt activity tolerance and safety awareness while engaging in functional tasks. Pt completed bed mobility tasks with up to MOD A with verbal cues for sequencing and STS from bed to RW with CGA and verbal cues for B hand placement. Pt tolerated ~8 minutes OOB functional activity requiring 1 standing rest break to sustain activity tolerance and endurance. Pt demonstrated safety awareness by pacing during functional activity and initiating rest break without cues from OTS. OT to continue following pt acutely to address functional needs with discharge recommendations of HHOT to maximize functional independence.       If plan is discharge home, recommend the following:  A little help with walking and/or transfers;A lot of help with bathing/dressing/bathroom;Assistance with feeding;Assist for transportation;Help with stairs or ramp for entrance   Equipment Recommendations  BSC/3in1    Recommendations for Other Services      Precautions / Restrictions Precautions Precautions: Fall;Other (comment) Recall of Precautions/Restrictions: Intact Precaution/Restrictions Comments: NGT Restrictions Weight Bearing Restrictions Per Provider Order: No       Mobility Bed Mobility Overal bed mobility: Needs Assistance Bed Mobility: Supine to Sit, Sit to Supine     Supine to sit: Contact guard, HOB elevated, Used rails Sit to supine: Mod assist, HOB elevated, Used rails   General bed mobility comments:  Pt required verbal cues for sequencing and increased time for supine to sit. Pt with good initiation lifting BLE back into bed but required assistance completing task.    Transfers Overall transfer level: Needs assistance Equipment used: Rolling walker (2 wheels) Transfers: Sit to/from Stand Sit to Stand: Contact guard assist           General transfer comment: Pt required verbal cues for B hand placement     Balance Overall balance assessment: Needs assistance Sitting-balance support: Bilateral upper extremity supported, Feet supported Sitting balance-Leahy Scale: Fair Sitting balance - Comments: static sitting EOB   Standing balance support: Bilateral upper extremity supported, Reliant on assistive device for balance, During functional activity Standing balance-Leahy Scale: Poor Standing balance comment: Pt required support of AD while standing and performing functional mobility                           ADL either performed or assessed with clinical judgement   ADL Overall ADL's : Needs assistance/impaired                                     Functional mobility during ADLs: Contact guard assist;Rolling walker (2 wheels) General ADL Comments: Pt deferred ADLs but tolerated functional ambulation of household distances implementing self-initiated rest breaks to sustain activity tolerance and endurance    Extremity/Trunk Assessment Upper Extremity Assessment Upper Extremity Assessment: Overall WFL for tasks assessed   Lower Extremity Assessment Lower Extremity Assessment: Defer to PT evaluation        Vision   Vision Assessment?: No apparent visual deficits   Perception Perception Perception: Not tested   Praxis  Praxis Praxis: Not tested   Communication Communication Communication: Other (comment) Factors Affecting Communication: Reduced clarity of speech   Cognition Arousal: Alert Behavior During Therapy: WFL for tasks  assessed/performed Cognition: No apparent impairments             OT - Cognition Comments: Pt able to follow verbal commands and demonstrated safety when performing functional mobility by pacing and implementing self-initiated rest breaks. Pt required repeated cues at times and for safety when performing functional mobility. Pt daughter answered majority of questions on pt behalf                 Following commands: Intact        Cueing   Cueing Techniques: Verbal cues  Exercises      Shoulder Instructions       General Comments VSS on RA    Pertinent Vitals/ Pain       Pain Assessment Pain Assessment: Faces Faces Pain Scale: Hurts a little bit Pain Location: abdomen Pain Descriptors / Indicators: Discomfort, Aching Pain Intervention(s): Limited activity within patient's tolerance  Home Living                                          Prior Functioning/Environment              Frequency  Min 1X/week        Progress Toward Goals  OT Goals(current goals can now be found in the care plan section)  Progress towards OT goals: Progressing toward goals  Acute Rehab OT Goals Patient Stated Goal: to walk OT Goal Formulation: With patient Time For Goal Achievement: 02/03/24 Potential to Achieve Goals: Good ADL Goals Pt Will Perform Grooming: with supervision;standing Pt Will Perform Lower Body Dressing: with supervision;sit to/from stand Pt Will Transfer to Toilet: with supervision;ambulating Pt Will Perform Tub/Shower Transfer: Tub transfer;with contact guard assist  Plan      Co-evaluation                 AM-PAC OT "6 Clicks" Daily Activity     Outcome Measure   Help from another person eating meals?: None Help from another person taking care of personal grooming?: A Little Help from another person toileting, which includes using toliet, bedpan, or urinal?: A Little Help from another person bathing (including washing,  rinsing, drying)?: A Lot Help from another person to put on and taking off regular upper body clothing?: A Little Help from another person to put on and taking off regular lower body clothing?: A Lot 6 Click Score: 17    End of Session Equipment Utilized During Treatment: Gait belt;Rolling walker (2 wheels)  OT Visit Diagnosis: Unsteadiness on feet (R26.81);Muscle weakness (generalized) (M62.81)   Activity Tolerance Patient limited by fatigue   Patient Left in bed;with call bell/phone within reach;with family/visitor present   Nurse Communication Mobility status        Time: 1553-1610 OT Time Calculation (min): 17 min  Charges: OT General Charges $OT Visit: 1 Visit OT Treatments $Therapeutic Activity: 8-22 mins  [address], Darliss Cheney 01/23/2024, 4:45 PM

## 2024-01-23 NOTE — Progress Notes (Signed)
 5 Days Post-Op   Chief Complaint/Subjective: Progressive distention and abdominal discomfort yesterday despite NG tube.  Feels a little better today but remains distended. Had another loose BM last night.   Objective: Vital signs in last 24 hours: Temp:  [97.7 F (36.5 C)-98.6 F (37 C)] 97.9 F (36.6 C) (03/19 0455) Pulse Rate:  [61-79] 65 (03/19 0455) Resp:  [18] 18 (03/19 0455) BP: (167-198)/(87-118) 187/100 (03/19 0455) SpO2:  [97 %-100 %] 100 % (03/19 0455) Weight:  [60.7 kg-66.2 kg] 66.2 kg (03/19 0454) Last BM Date : 01/22/24 Intake/Output from previous day: 03/18 0701 - 03/19 0700 In: 2010.3 [I.V.:1239.6; IV Piggyback:770.7] Out: 300 [Urine:250; Emesis/NG output:50]  PE: Gen: elderly black female, NAD. Resp: nonlabored Card: RRR Abd: soft, incision clean dry and intact, increasing abdominal distention, no peritonitis, mild tenderness  NG in place- 50 mL/24h, NG flushed with 60 mL water and would not return fluid.   Lab Results:  Recent Labs    01/22/24 0323 01/23/24 0316  WBC 11.4* 8.9  HGB 11.4* 11.5*  HCT 35.1* 35.7*  PLT 673* 721*   Recent Labs    01/22/24 0323 01/23/24 0316  NA 140 136  137  K 3.0* 3.5  3.5  CL 101 101  101  CO2 27 25  26   GLUCOSE 89 162*  155*  BUN 8 8  12   CREATININE 1.28* 1.21*  1.28*  CALCIUM 8.6* 8.3*  8.3*   No results for input(s): "LABPROT", "INR" in the last 72 hours.    Component Value Date/Time   NA 137 01/23/2024 0316   NA 136 01/23/2024 0316   K 3.5 01/23/2024 0316   K 3.5 01/23/2024 0316   CL 101 01/23/2024 0316   CL 101 01/23/2024 0316   CO2 26 01/23/2024 0316   CO2 25 01/23/2024 0316   GLUCOSE 155 (H) 01/23/2024 0316   GLUCOSE 162 (H) 01/23/2024 0316   BUN 12 01/23/2024 0316   BUN 8 01/23/2024 0316   CREATININE 1.28 (H) 01/23/2024 0316   CREATININE 1.21 (H) 01/23/2024 0316   CREATININE 1.26 (H) 11/13/2023 1248   CALCIUM 8.3 (L) 01/23/2024 0316   CALCIUM 8.3 (L) 01/23/2024 0316   PROT 5.8  (L) 01/23/2024 0316   ALBUMIN 2.3 (L) 01/23/2024 0316   ALBUMIN 2.2 (L) 01/23/2024 0316   AST 19 01/23/2024 0316   AST 16 11/13/2023 1248   ALT 18 01/23/2024 0316   ALT 8 11/13/2023 1248   ALKPHOS 50 01/23/2024 0316   BILITOT 0.5 01/23/2024 0316   BILITOT 0.5 11/13/2023 1248   GFRNONAA 41 (L) 01/23/2024 0316   GFRNONAA 44 (L) 01/23/2024 0316   GFRNONAA 42 (L) 11/13/2023 1248   GFRAA 48 (L) 12/02/2018 1255    Assessment/Plan  s/p Procedure(s): COLECTOMY, PARTIAL LAPAROTOMY, EXPLORATORY 01/18/2024  - path: colonic adenocarcinoma, negative margins, 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 Oncology has seen the patient, outpatient F/U Dr. Mosetta Putt.  - afebrile, WBC 8.9 - worsening abdominal distention - NG is not working. REMOVE NGT and replace with a new 16 or 56F NGT.  early BMs may be black from old blood that was present during surgery. Monitor.  - s/p 2 units pRBC 3/17, hgb is 11.5 this AM, stable - PICC placed 3/17,  continue TPN  FEN - NPO, NG LIWS, ice chips/sips for comfort ok, TPN VTE - heparin [address] [place] ID - ceftriaxone completed for UTI  Disposition - ileus after bowel surgery  LOS: 6 days   I reviewed last 24 h vitals and pain scores, last 48 h intake and output, last 24 h labs and trends, and last 24 h imaging results.  This care required high  level of medical decision making.   Francine Graven [facility] 01/23/2024, 7:36 AM Please see Amion for pager number during day hours 7:00am-4:30pm or 7:00am -11:30am on weekends

## 2024-01-23 NOTE — Progress Notes (Signed)
 [facility] night cross cover note:   I was notified by pt's RN that the patient's NG tube is not positioned appropriately and that radiology recommends formal consultation of interventional radiology for replacement of NG tube.  In light of this information, I placed a nursing communication order requesting that existing NG tube be removed, and conveyed this request to the patient's RN as well.     Newton Pigg, DO Hospitalist

## 2024-01-23 NOTE — Progress Notes (Signed)
 PROGRESS NOTE    [name]  ION:[id number] DOB: [birth year] DOA: 01/17/2024 PCP: Jackie Plum, MD   Brief Narrative:  [name] is a 88 y.o. 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  Assessment and Plan:  SBO 2/2 to large ileocecal/ascending colonic mass--colonic adenocarcinoma: CT abdomen and pelvis done consistent with small bowel obstruction secondary to large ileocecal/ascending colonic mass measuring up to 7.3 cm.  No perforation noted.  Enlarged mesenteric nodes in the vicinity of the colon mass suspicious for metastatic adenopathy.  Indeterminate splenic masses measuring up to 2.7 cm centimeters. NG tube replaced today as the prior one was not working and she had worsening abdominal distention. Patient seen in consultation by general surgery who assessed patient and patient underwent open right hemicolectomy with ileocolic anastomosis on 01/18/2024.  Pathology consistent with colonic adenocarcinoma, 13 cm extending into pericolonic connective.  Negative surgical margins noted.  13 lymph nodes negative for metastatic carcinoma.  Negative for lymphovascular involvement.  7 separate tubular adenomas without high-grade dysplasia noted. Currently n.p.o. with bowel rest as we are Awaiting bowel function. Med Onc consulted will follow-up outpatient with Dr. Mosetta Putt.  Currently getting TPN as PICC line was placed 317.  Postop ileus: Patient with postop ileus.Patient stated had some flatus this morning as well. Keep electrolytes repleted potassium approximately 4, magnesium approximately 2.  Mobilize.  Per general surgery.    Atypical Chest Pain: Patient noted with complaints of atypical chest pain localized on the left as well as left upper abdominal pain and nonradiating on 01/19/2024. Patient  describes the pain as a gas bubble. Patient with somewhat reproducible chest pain. EKG with no ischemic changes noted.  Cardiac enzymes negative.   -Chest x-ray with increased density in the AP window which may reflect a dilated pulmonary artery, descending thoracic aortic aneurysm or lymphadenopathy.  CT chest recommended.  Stable enlarged cardiac silhouette.  No acute airspace disease.   -CT chest obtained without contrast with marked main pulmonary artery enlargement compatible with pulmonary hypertension relating to radiographic abnormality.  Aorta elongated and tortuous.  No mass or adenopathy in the chest.  Dependent atelectasis with small volume pleural fluid noted.   -Continue IV PPI twice daily, Voltaren gel, simethicone.  -TTE done and showed LVEF of 60-65% with no RWMA and LVDP c/w G1DD and RVSF normal -C/w Supportive care as she is clinically improved.   Iron Deficiency Anemia/Acute Postop Anemia: Hgb/Hct is now stable at 11.5/35.7. Likely postop acute blood loss anemia and also likely partly dilutional as patient placed on IV fluids. Follow H&H. -Transfusion threshold hemoglobin < 8. Daughter stated she had a dark tarry stool. Check FOBT   Hypokalemia/ Hypomagnesemia/Hypophosphatemia: K+ 3.5 this AM, Phos 3.7 and Mg 2.2 this AM. CTM and replete as necessary. Repeat labs in the AM.   Klebsiella UTI: Urinalysis moderate leukocytes, nitrite negative, rare bacteria, WBC 21-50. Urine cultures with 30,000 colonies of Klebsiella pneumonia. S/p post 3 days IV Rocephin.    AKI on CKD stage IIIa: Likely secondary to prerenal azotemia as patient presented with small bowel obstruction, nausea vomiting and has been n.p.o. since admission. BUN/Cr Trend: Recent Labs  Lab 01/17/24 1830 01/18/24 0456 01/19/24 0708 01/20/24 0415 01/21/24 0646 01/22/24 0323 01/23/24 0316  BUN 19 19 19 23 15 8 8  12   CREATININE 1.07* 1.24* 1.69* 1.67* 1.28* 1.28* 1.21*  1.28*  -  Avoid Nephrotoxic Medications,  Contrast Dyes, Hypotension and Dehydration to Ensure Adequate Renal Perfusion and will need to Renally Adjust Meds; Improved with IVF Hydration -Continue to Monitor and Trend Renal Function carefully and repeat CMP in the AM    Well-Controlled Diabetes Mellitus type 2: Hemoglobin A1c 5.7. Patient currently NPO.Continue to Hold home regimen Glucophage. IVF with D5 normal saline at 75 mL/hr had stopped but will resume again for 1 day   Essential Hypertension: -Currently n.p.o. due to small bowel obstruction and unable to take oral intake. Continue to hold home oral antihypertensive medications. Increased IV Metoprolol Tartrate to 7.5 mg every 6 hours. C/w IV Hydralazine 10 mg q4hprn for SBP >180. CTM BP per Protcol. Last BP Reading was    Essential Thrombocytosis: Platelet Count trending up and is now 721. Follows with Hematology, Dr. [name]. Managed on Anagrelide which is currently on hold. Heme/Onc following and recommending CTM and Trend and repeat CBC w/ Diff in the AM   Hypothyroidism: Levothyroxine on hold as patient currently NPO. C/w IV Levothyroxine 50 mcg Daily    HLD: Continue to hold statin and could likely resume on discharge given that she remains NPO   Splenic masses: Indeterminate splenic masses measuring up to 2.7 cm noted on CT abdomen and pelvis. May need a dedicated abdominal MRI for further evaluation once patient is clinically stable.  Hypoalbuminemia: Albumin Trend has gone from 2.8 -> 2.4 -> 2.3 -> 2.2. CTM and Trend and repeat CMP in the AM  Overweight: Complicates overall prognosis and care. Estimated body mass index is 27.58 kg/m as calculated from the following:   Height as of this encounter: 5\' 1"  (1.549 m).   Weight as of this encounter: 66.2 kg. Weight Loss and Dietary Counseling given   DVT prophylaxis: heparin injection 5,000 Units Start: 01/18/24 0600    Code Status: Full Code Family Communication: Discussed w/ Daughter at bedside  Disposition Plan:  Level  of care: Telemetry Medical Status is: Inpatient Remains inpatient appropriate because: Needs further clinical improvement   Consultants:  General Surgery Medical Oncology  Procedures:  As delineated as above  Antimicrobials:  Anti-infectives (From admission, onward)    Start     Dose/Rate Route Frequency Ordered Stop   01/18/24 0915  cefoTEtan (CEFOTAN) 2 g in sodium chloride 0.9 % 100 mL IVPB        2 g 200 mL/hr over 30 Minutes Intravenous On call to O.R. 01/18/24 4782 01/18/24 2042   01/18/24 0915  cefTRIAXone (ROCEPHIN) 2 g in sodium chloride 0.9 % 100 mL IVPB        2 g 200 mL/hr over 30 Minutes Intravenous Every 24 hours 01/18/24 0819 01/20/24 1011       Subjective: Seen and examined at bedside was doing okay but states that she had some abdominal discomfort whenever her abdomen was palpated.  Daughter states that she had a little bit of black stools.  No nausea or vomiting.  NG tube is not working shoulders to be replaced.  Objective: Vitals:   01/23/24 0455 01/23/24 0916 01/23/24 1124 01/23/24 1234  BP: (!) 187/100 (!) 184/110 (!) 170/110 (!) 192/108  Pulse: 65 72 75 70  Resp: 18 16  16   Temp: 97.9 F (36.6 C) (!) 97.5 F (36.4 C)  97.9 F (36.6 C)  TempSrc: Oral Oral  Oral  SpO2: 100% 96%  100%  Weight:      Height:        Intake/Output Summary (Last 24 hours)  at 01/23/2024 1855 Last data filed at 01/23/2024 1800 Gross per 24 hour  Intake 1239.63 ml  Output 550 ml  Net 689.63 ml   Filed Weights   01/21/24 0500 01/22/24 1538 01/23/24 0454  Weight: 62.8 kg 60.7 kg 66.2 kg   Examination: Physical Exam:  Constitutional: Chronically ill-appearing elderly African-American female who appears a little uncomfortable Respiratory: Diminished to auscultation bilaterally, no wheezing, rales, rhonchi or crackles. Normal respiratory effort and patient is not tachypenic. No accessory muscle use.  Unlabored breathing Cardiovascular: RRR, no murmurs / rubs / gallops.  S1 and S2 auscultated. No extremity edema.  Abdomen: Soft, tender to palpate and distended secondary body habitus. Bowel sounds positive.  GU: Deferred. Musculoskeletal: No clubbing / cyanosis of digits/nails. No joint deformity upper and lower extremities. Skin: No rashes, lesions, ulcers on limited skin evaluation. No induration; Warm and dry.  Neurologic: CN 2-12 grossly intact with no focal deficits. Romberg sign and cerebellar reflexes not assessed.  Psychiatric: She is awake and alert  Data Reviewed: I have personally reviewed following labs and imaging studies  CBC: Recent Labs  Lab 01/17/24 1714 01/18/24 0456 01/19/24 0708 01/20/24 0415 01/20/24 1509 01/21/24 0646 01/22/24 0323 01/23/24 0316  WBC 11.9*   < > 12.5* 10.7*  --  8.9 11.4* 8.9  NEUTROABS 9.6*  --   --   --   --  6.6 7.5 5.2  HGB 9.9*   < > 9.0* 8.1* 7.9* 7.2* 11.4* 11.5*  HCT 31.6*   < > 28.5* 26.3* 25.5* 23.3* 35.1* 35.7*  MCV 81.4   < > 81.2 80.2  --  82.3 80.9 81.7  PLT 465*   < > 461* 392  --  546* 673* 721*   < > = values in this interval not displayed.   Basic Metabolic Panel: Recent Labs  Lab 01/19/24 0708 01/20/24 0415 01/21/24 0646 01/22/24 0323 01/23/24 0316  NA 139 139 140 140 136  137  K 3.7 3.7 2.8* 3.0* 3.5  3.5  CL 106 107 104 101 101  101  CO2 25 20* 24 27 25  26   GLUCOSE 127* 106* 165* 89 162*  155*  BUN 19 23 15 8 8  12   CREATININE 1.69* 1.67* 1.28* 1.28* 1.21*  1.28*  CALCIUM 8.1* 8.5* 8.3* 8.6* 8.3*  8.3*  MG 1.6* 2.3 2.1 1.6* 2.2  PHOS 5.4*  --   --  1.7* 3.8  3.7   GFR: Estimated Creatinine Clearance: 27.5 mL/min (A) (by C-G formula based on SCr of 1.28 mg/dL (H)). Liver Function Tests: Recent Labs  Lab 01/17/24 1830 01/19/24 0708 01/22/24 0323 01/23/24 0316  AST 20  --   --  19  ALT 9  --   --  18  ALKPHOS 53  --   --  50  BILITOT 0.5  --   --  0.5  PROT 6.5  --   --  5.8*  ALBUMIN 2.8* 2.4* 2.3* 2.2*  2.3*   Recent Labs  Lab 01/17/24 1830  LIPASE  17   No results for input(s): "AMMONIA" in the last 168 hours. Coagulation Profile: No results for input(s): "INR", "PROTIME" in the last 168 hours. Cardiac Enzymes: No results for input(s): "CKTOTAL", "CKMB", "CKMBINDEX", "TROPONINI" in the last 168 hours. BNP (last 3 results) No results for input(s): "PROBNP" in the last 8760 hours. HbA1C: No results for input(s): "HGBA1C" in the last 72 hours. CBG: Recent Labs  Lab 01/22/24 1653 01/22/24 2353 01/23/24 0450 01/23/24 1228  01/23/24 1757  GLUCAP 115* 180* 170* 137* 168*   Lipid Profile: Recent Labs    01/23/24 0316  TRIG 122   Thyroid Function Tests: No results for input(s): "TSH", "T4TOTAL", "FREET4", "T3FREE", "THYROIDAB" in the last 72 hours. Anemia Panel: No results for input(s): "VITAMINB12", "FOLATE", "FERRITIN", "TIBC", "IRON", "RETICCTPCT" in the last 72 hours. Sepsis Labs: No results for input(s): "PROCALCITON", "LATICACIDVEN" in the last 168 hours.  Recent Results (from the past 240 hours)  Urine Culture     Status: Abnormal   Collection Time: 01/17/24  8:10 PM   Specimen: Urine, Clean Catch  Result Value Ref Range Status   Specimen Description URINE, CLEAN CATCH  Final   Special Requests   Final    NONE Performed at [facility], [address], [place], [place] [address]    Culture 30,000 COLONIES/mL KLEBSIELLA PNEUMONIAE (A)  Final   Report Status 01/19/2024 FINAL  Final   Organism ID, Bacteria KLEBSIELLA PNEUMONIAE (A)  Final      Susceptibility   Klebsiella pneumoniae - MIC*    AMPICILLIN RESISTANT Resistant     CEFAZOLIN <=4 SENSITIVE Sensitive     CEFEPIME <=0.12 SENSITIVE Sensitive     CEFTRIAXONE <=0.25 SENSITIVE Sensitive     CIPROFLOXACIN <=0.25 SENSITIVE Sensitive     GENTAMICIN <=1 SENSITIVE Sensitive     IMIPENEM <=0.25 SENSITIVE Sensitive     NITROFURANTOIN 128 RESISTANT Resistant     TRIMETH/SULFA <=20 SENSITIVE Sensitive     AMPICILLIN/SULBACTAM 4 SENSITIVE Sensitive      PIP/TAZO <=4 SENSITIVE Sensitive ug/mL    * 30,000 COLONIES/mL KLEBSIELLA PNEUMONIAE    Radiology Studies: DG Abd Portable 1V Result Date: 01/23/2024 CLINICAL DATA:  Enteric tube placement.  Bowel obstruction. EXAM: PORTABLE ABDOMEN - 1 VIEW COMPARISON:  Abdominal x-ray from yesterday. FINDINGS: The enteric tube is looped back on itself in the esophagus with the tip above the thoracic inlet. Unchanged small bowel dilatation. Unchanged cardiomegaly and small left-greater-than-right pleural effusions with bibasilar atelectasis. Unchanged right upper extremity PICC line. IMPRESSION: 1. The enteric tube is looped back on itself in the esophagus with the tip above the thoracic inlet. Recommend repositioning. 2. Unchanged small bowel obstruction. Electronically Signed   By: Obie Dredge M.D.   On: 01/23/2024 14:18   DG Abd Portable 1V Result Date: 01/22/2024 CLINICAL DATA:  NG tube placement. EXAM: PORTABLE ABDOMEN - 1 VIEW COMPARISON:  Radiograph yesterday FINDINGS: Tip and side port of the enteric tube below the diaphragm in the stomach. Gaseous small bowel distention in the central abdomen. Midline skin staples again seen. IMPRESSION: Tip and side port of the enteric tube below the diaphragm in the stomach. Electronically Signed   By: Narda Rutherford M.D.   On: 01/22/2024 21:11   ECHOCARDIOGRAM COMPLETE Result Date: 01/22/2024    ECHOCARDIOGRAM REPORT   Patient Name:   DAYLENE VANDENBOSCH Date of Exam: 01/22/2024 Medical Rec #:  [id number]   Height:       61.0 in Accession #:    [id number]  Weight:       138.4 lb Date of Birth:  1935/12/02    BSA:          1.615 m Patient Age:    88 years    BP:           176/100 mmHg Patient Gender: F           HR:           86 bpm. Exam  Location:  Inpatient Procedure: 2D Echo, Color Doppler and Cardiac Doppler (Both Spectral and Color            Flow Doppler were utilized during procedure). Indications:    Abnormal ECG  History:        Patient has prior history of Echocardiogram  examinations.                 Signs/Symptoms:Chest Pain; Risk Factors:Hypertension and                 Dyslipidemia.  Sonographer:    Lamont Snowball Referring Phys: [phone] DANIEL V THOMPSON IMPRESSIONS  1. Left ventricular ejection fraction, by estimation, is 60 to 65%. The left ventricle has normal function. The left ventricle has no regional wall motion abnormalities. Left ventricular diastolic parameters are consistent with Grade I diastolic dysfunction (impaired relaxation).  2. Right ventricular systolic function is normal. The right ventricular size is normal.  3. Left atrial size was severely dilated.  4. The mitral valve is normal in structure. Mild mitral valve regurgitation. No evidence of mitral stenosis.  5. The aortic valve is tricuspid. Aortic valve regurgitation is mild. Aortic valve sclerosis/calcification is present, without any evidence of aortic stenosis.  6. Aortic dilatation noted. There is mild dilatation of the ascending aorta, measuring 41 mm.  7. The inferior vena cava is normal in size with greater than 50% respiratory variability, suggesting right atrial pressure of 3 mmHg. Comparison(s): No prior Echocardiogram. FINDINGS  Left Ventricle: Left ventricular ejection fraction, by estimation, is 60 to 65%. The left ventricle has normal function. The left ventricle has no regional wall motion abnormalities. The left ventricular internal cavity size was normal in size. There is  no left ventricular hypertrophy. Left ventricular diastolic parameters are consistent with Grade I diastolic dysfunction (impaired relaxation). Right Ventricle: The right ventricular size is normal. Right ventricular systolic function is normal. Left Atrium: Left atrial size was severely dilated. Right Atrium: Right atrial size was normal in size. Pericardium: There is no evidence of pericardial effusion. Mitral Valve: The mitral valve is normal in structure. Mild mitral valve regurgitation. No evidence of mitral valve  stenosis. MV peak gradient, 3.8 mmHg. The mean mitral valve gradient is 1.7 mmHg. Tricuspid Valve: The tricuspid valve is normal in structure. Tricuspid valve regurgitation is mild . No evidence of tricuspid stenosis. Aortic Valve: The aortic valve is tricuspid. Aortic valve regurgitation is mild. Aortic regurgitation PHT measures 686 msec. Aortic valve sclerosis/calcification is present, without any evidence of aortic stenosis. Aortic valve mean gradient measures 7.7  mmHg. Aortic valve peak gradient measures 15.4 mmHg. Aortic valve area, by VTI measures 2.07 cm. Pulmonic Valve: The pulmonic valve was normal in structure. Pulmonic valve regurgitation is mild. No evidence of pulmonic stenosis. Aorta: Aortic dilatation noted. There is mild dilatation of the ascending aorta, measuring 41 mm. Venous: The inferior vena cava is normal in size with greater than 50% respiratory variability, suggesting right atrial pressure of 3 mmHg. IAS/Shunts: No atrial level shunt detected by color flow Doppler.  LEFT VENTRICLE PLAX 2D LVIDd:         4.00 cm LVIDs:         3.10 cm LV PW:         1.10 cm LV IVS:        1.10 cm LVOT diam:     2.10 cm LV SV:         77 LV SV Index:   48 LVOT Area:  3.46 cm  RIGHT VENTRICLE          IVC RV Basal diam:  3.70 cm  IVC diam: 0.90 cm LEFT ATRIUM             Index        RIGHT ATRIUM           Index LA diam:        3.10 cm 1.92 cm/m   RA Area:     19.00 cm LA Vol (A2C):   92.5 ml 57.26 ml/m  RA Volume:   51.70 ml  32.00 ml/m LA Vol (A4C):   72.2 ml 44.69 ml/m LA Biplane Vol: 80.9 ml 50.08 ml/m  AORTIC VALVE AV Area (Vmax):    2.09 cm AV Area (Vmean):   2.03 cm AV Area (VTI):     2.07 cm AV Vmax:           196.00 cm/s AV Vmean:          129.000 cm/s AV VTI:            0.373 m AV Peak Grad:      15.4 mmHg AV Mean Grad:      7.7 mmHg LVOT Vmax:         118.40 cm/s LVOT Vmean:        75.660 cm/s LVOT VTI:          0.223 m LVOT/AV VTI ratio: 0.60 AI PHT:            686 msec  AORTA Ao  Root diam: 3.10 cm Ao Asc diam:  4.15 cm MITRAL VALVE               TRICUSPID VALVE MV Area (PHT): 3.24 cm    TR Peak grad:   43.0 mmHg MV Area VTI:   3.90 cm    TR Vmax:        328.00 cm/s MV Peak grad:  3.8 mmHg MV Mean grad:  1.7 mmHg    SHUNTS MV Vmax:       0.98 m/s    Systemic VTI:  0.22 m MV Vmean:      59.9 cm/s   Systemic Diam: 2.10 cm MV Decel Time: 235 msec MV E velocity: 54.00 cm/s MV A velocity: 97.60 cm/s MV E/A ratio:  0.55 Olga Millers MD Electronically signed by Olga Millers MD Signature Date/Time: 01/22/2024/12:08:45 PM    Final    Scheduled Meds:  Chlorhexidine Gluconate Cloth  6 each Topical Daily   heparin  5,000 Units Subcutaneous Q8H   insulin aspart  0-9 Units Subcutaneous Q6H   [START ON 01/29/2024] levothyroxine  50 mcg Intravenous Daily   lidocaine  1 patch Transdermal Q24H   metoprolol tartrate  7.5 mg Intravenous Q6H   pantoprazole (PROTONIX) IV  40 mg Intravenous Q12H   simethicone  80 mg Oral QID   sodium chloride flush  10-40 mL Intracatheter Q12H   Continuous Infusions:  dextrose 5 % and 0.9 % NaCl     TPN ADULT (ION) 75 mL/hr at 01/23/24 1734    LOS: 6 days   Marguerita Merles, DO [facility] Available via Epic secure chat 7am-7pm After these hours, please refer to coverage provider listed on [email or web address] 01/23/2024, 6:55 PM

## 2024-01-23 NOTE — Progress Notes (Addendum)
 PHARMACY - TOTAL PARENTERAL NUTRITION CONSULT NOTE   Indication: Prolonged ileus  Patient Measurements: Height: 5\' 1"  (154.9 cm) Weight: 66.2 kg (145 lb 15.1 oz) IBW/kg (Calculated) : 47.8 TPN AdjBW (KG): 57.6 Body mass index is 27.58 kg/m.  Assessment:  3 YOF presenting with SBO with ileocecal/colon mass, s/p hemicolectomy 3/14, prolonged ileus post-op with increased abdominal distention   Glucose / Insulin: CBGs 120-170s with TPN start (also D5 gtt was added back @75  mL/hr after originally d/c with TPN start), 3/14 A1c 5.7 (metformin PTA) 5 units insulin used in last 24h with sSSI Electrolytes: K 3.5 (goal 4, will give some outside TPN in addition to TPN inc to goal rate), Phos 3.7 s/p replacement, Mg 2.2 s/p replacement, others wnl Renal: SCr 1.21, BUN 8 Hepatic: LFTs wnl, TG 122 Intake / Output; MIVF: NG 200>50 mL, UOP (likely not full 24h collection) GI Imaging: 3/13 CT abd/pelvis - SBO with large ileocecal/ascending colon mass, concern for malignancy GI Surgeries / Procedures: 3/14 colectomy  Central access: PICC (double lumen) placed 3/17 TPN start date: 3/18  Nutritional Goals: Goal TPN rate is 75 mL/hr  RD Assessment: 3/18 Estimated Needs Total Energy Estimated Needs: 1500-1700 kcal Total Protein Estimated Needs: 65-85 gm Total Fluid Estimated Needs: >1.5L/day  Current Nutrition:  NPO  Plan:  Increase TPN to goal rate 75 mL/hr at 1800 (provides 100% of protein and kcal needs) Intralipid d/t allergy potential  Electrolytes in TPN: Na 39mEq/L, K 73mEq/L, Ca 76mEq/L, Mg 61mEq/L, and Phos 40mmol/L. Cl:Ac 1:1 Add standard MVI/Thiamine and trace elements to TPN Continue Sensitive q6h SSI and adjust as needed  D/c D5/NS gtt  K 10 mEq IV x 2 TPN labs in AM  Addendum: PT reports allergy to shrimp only, has no issues with fish therefore will proceed with SMOF lipid and monitor  Daylene Posey, PharmD, [facility] Clinical Pharmacist ED Pharmacist Phone #  [phone] 01/23/2024 8:01 AM

## 2024-01-24 ENCOUNTER — Other Ambulatory Visit: Payer: Self-pay

## 2024-01-24 ENCOUNTER — Inpatient Hospital Stay (HOSPITAL_COMMUNITY)

## 2024-01-24 DIAGNOSIS — K56609 Unspecified intestinal obstruction, unspecified as to partial versus complete obstruction: Secondary | ICD-10-CM | POA: Diagnosis not present

## 2024-01-24 DIAGNOSIS — K9189 Other postprocedural complications and disorders of digestive system: Secondary | ICD-10-CM | POA: Diagnosis not present

## 2024-01-24 DIAGNOSIS — N1831 Chronic kidney disease, stage 3a: Secondary | ICD-10-CM | POA: Diagnosis not present

## 2024-01-24 DIAGNOSIS — N179 Acute kidney failure, unspecified: Secondary | ICD-10-CM | POA: Diagnosis not present

## 2024-01-24 LAB — GLUCOSE, CAPILLARY
Glucose-Capillary: 114 mg/dL — ABNORMAL HIGH (ref 70–99)
Glucose-Capillary: 117 mg/dL — ABNORMAL HIGH (ref 70–99)
Glucose-Capillary: 117 mg/dL — ABNORMAL HIGH (ref 70–99)
Glucose-Capillary: 146 mg/dL — ABNORMAL HIGH (ref 70–99)
Glucose-Capillary: 163 mg/dL — ABNORMAL HIGH (ref 70–99)
Glucose-Capillary: 173 mg/dL — ABNORMAL HIGH (ref 70–99)

## 2024-01-24 LAB — COMPREHENSIVE METABOLIC PANEL
ALT: 17 U/L (ref 0–44)
AST: 23 U/L (ref 15–41)
Albumin: 2.3 g/dL — ABNORMAL LOW (ref 3.5–5.0)
Alkaline Phosphatase: 52 U/L (ref 38–126)
Anion gap: 8 (ref 5–15)
BUN: 13 mg/dL (ref 8–23)
CO2: 26 mmol/L (ref 22–32)
Calcium: 8.5 mg/dL — ABNORMAL LOW (ref 8.9–10.3)
Chloride: 104 mmol/L (ref 98–111)
Creatinine, Ser: 1.03 mg/dL — ABNORMAL HIGH (ref 0.44–1.00)
GFR, Estimated: 53 mL/min — ABNORMAL LOW (ref >60)
Glucose, Bld: 199 mg/dL — ABNORMAL HIGH (ref 70–99)
Potassium: 3.6 mmol/L (ref 3.5–5.1)
Sodium: 138 mmol/L (ref 135–145)
Total Bilirubin: 0.6 mg/dL (ref 0.0–1.2)
Total Protein: 5.6 g/dL — ABNORMAL LOW (ref 6.5–8.1)

## 2024-01-24 LAB — CBC WITH DIFFERENTIAL/PLATELET
Abs Immature Granulocytes: 0.24 10*3/uL — ABNORMAL HIGH (ref 0.00–0.07)
Basophils Absolute: 0.1 10*3/uL (ref 0.0–0.1)
Basophils Relative: 1 %
Eosinophils Absolute: 0.7 10*3/uL — ABNORMAL HIGH (ref 0.0–0.5)
Eosinophils Relative: 7 %
HCT: 33.7 % — ABNORMAL LOW (ref 36.0–46.0)
Hemoglobin: 10.9 g/dL — ABNORMAL LOW (ref 12.0–15.0)
Immature Granulocytes: 2 %
Lymphocytes Relative: 16 %
Lymphs Abs: 1.6 10*3/uL (ref 0.7–4.0)
MCH: 26.5 pg (ref 26.0–34.0)
MCHC: 32.3 g/dL (ref 30.0–36.0)
MCV: 82 fL (ref 80.0–100.0)
Monocytes Absolute: 1.1 10*3/uL — ABNORMAL HIGH (ref 0.1–1.0)
Monocytes Relative: 11 %
Neutro Abs: 6.3 10*3/uL (ref 1.7–7.7)
Neutrophils Relative %: 63 %
Platelets: 671 10*3/uL — ABNORMAL HIGH (ref 150–400)
RBC: 4.11 MIL/uL (ref 3.87–5.11)
RDW: 17.8 % — ABNORMAL HIGH (ref 11.5–15.5)
WBC: 10 10*3/uL (ref 4.0–10.5)
nRBC: 0.4 % — ABNORMAL HIGH (ref 0.0–0.2)

## 2024-01-24 LAB — MAGNESIUM: Magnesium: 1.8 mg/dL (ref 1.7–2.4)

## 2024-01-24 LAB — PHOSPHORUS: Phosphorus: 2.6 mg/dL (ref 2.5–4.6)

## 2024-01-24 MED ORDER — LIDOCAINE VISCOUS HCL 2 % MT SOLN
6.0000 mL | Freq: Once | OROMUCOSAL | Status: AC
  Administered 2024-01-24: 6 mL via OROMUCOSAL
  Filled 2024-01-24: qty 15

## 2024-01-24 MED ORDER — MAGNESIUM SULFATE 2 GM/50ML IV SOLN
2.0000 g | Freq: Once | INTRAVENOUS | Status: AC
  Administered 2024-01-24: 2 g via INTRAVENOUS
  Filled 2024-01-24: qty 50

## 2024-01-24 MED ORDER — DEXTROSE 10 % IV SOLN: INTRAVENOUS | Status: AC

## 2024-01-24 MED ORDER — LATANOPROST 0.005 % OP SOLN
1.0000 [drp] | Freq: Every day | OPHTHALMIC | Status: DC
  Administered 2024-01-24 – 2024-01-29 (×6): 1 [drp] via OPHTHALMIC
  Filled 2024-01-24: qty 2.5

## 2024-01-24 MED ORDER — TRAVASOL 10 % IV SOLN
INTRAVENOUS | Status: DC
  Filled 2024-01-24: qty 840

## 2024-01-24 NOTE — Progress Notes (Signed)
 CVAD removed. Manual pressure applied for 3 mins. Vaseline gauze, gauze, and Tegaderm applied over insertion site. No bleeding or swelling noted. Instructed patient to remain in bed for thirty mins. Educated patient about S/S of infection and when to call MD; no heavy lifting or pressure on R side for 24 hours; keep dressing dry and intact for 24 hours. Pt verbalized comprehension.

## 2024-01-24 NOTE — Progress Notes (Addendum)
 Chest xray obtained 1842 and read at 2042. Messaged primary RN and stated that the tip of the PICC was no longer in the SVC per the read of the radiologist and therefore would need to be removed and replaced. Consulted with PICC nurse who confirmed. Educated RN that TPN and any other medications cannot be hung thru the PICC due to tip malposition. Stated that covering provider needed to be contacted for a remove PICC order and a D10 infusion started until TPN is resumed. Also stated that the covering provider could order for a new PICC to be placed and when PICC coverage resumed tomorrow 3/20, the patient could be on the list. Dr. Marland Mcalpine is currently signed up for this patient but is not online at this time to be added to epic chat.

## 2024-01-24 NOTE — Progress Notes (Signed)
  6 Days Post-Op   Chief Complaint/Subjective: Pt unable to get NGT placed at bedside yesterday. Increased distention overnight and pain. No vomiting and did have a little bit of flatus after walking yesterday. Increased area of swelling at superior part of incision as well. Daughter at bedside.   Objective: Vital signs in last 24 hours: Temp:  [97.9 F (36.6 C)-98.7 F (37.1 C)] 98.2 F (36.8 C) (03/20 0816) Pulse Rate:  [70-94] 83 (03/20 0816) Resp:  [16-18] 18 (03/20 0816) BP: (128-192)/(72-110) 173/102 (03/20 0816) SpO2:  [99 %-100 %] 99 % (03/20 0816) Weight:  [65 kg] 65 kg (03/20 0531) Last BM Date : 01/22/24 Intake/Output from previous day: 03/19 0701 - 03/20 0700 In: 1476.1 [I.V.:1476.1] Out: 350 [Urine:350]  PE: Gen: elderly black female, NAD. Resp: nonlabored Card: RRR Abd: soft, incision C/D/I, some edema superiorly with concern for hernia superiorly, distended   Lab Results:  Recent Labs    01/23/24 0316 01/24/24 0314  WBC 8.9 10.0  HGB 11.5* 10.9*  HCT 35.7* 33.7*  PLT 721* 671*   Recent Labs    01/23/24 0316 01/24/24 0314  NA 136  137 138  K 3.5  3.5 3.6  CL 101  101 104  CO2 25  26 26   GLUCOSE 162*  155* 199*  BUN 8  12 13   CREATININE 1.21*  1.28* 1.03*  CALCIUM 8.3*  8.3* 8.5*   No results for input(s): "LABPROT", "INR" in the last 72 hours.    Component Value Date/Time   NA 138 01/24/2024 0314   K 3.6 01/24/2024 0314   CL 104 01/24/2024 0314   CO2 26 01/24/2024 0314   GLUCOSE 199 (H) 01/24/2024 0314   BUN 13 01/24/2024 0314   CREATININE 1.03 (H) 01/24/2024 0314   CREATININE 1.26 (H) 11/13/2023 1248   CALCIUM 8.5 (L) 01/24/2024 0314   PROT 5.6 (L) 01/24/2024 0314   ALBUMIN 2.3 (L) 01/24/2024 0314   AST 23 01/24/2024 0314   AST 16 11/13/2023 1248   ALT 17 01/24/2024 0314   ALT 8 11/13/2023 1248   ALKPHOS 52 01/24/2024 0314   BILITOT 0.6 01/24/2024 0314   BILITOT 0.5 11/13/2023 1248   GFRNONAA 53 (L) 01/24/2024 0314    GFRNONAA 42 (L) 11/13/2023 1248   GFRAA 48 (L) 12/02/2018 1255    Assessment/Plan  s/p Procedure(s): COLECTOMY, PARTIAL LAPAROTOMY, EXPLORATORY 01/18/2024  - path: colonic adenocarcinoma, negative margins, 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 Oncology has seen the patient, outpatient F/U Dr. Mosetta Putt.  - afebrile, WBC 10 - NGT to be placed in radiology today - s/p 2 units pRBC 3/17, hgb is 10.9 this AM - PICC placed 3/17,  continue TPN  FEN - NPO, NG LIWS, ice chips/sips for comfort ok, TPN VTE - heparin 5000 Yaak ID - ceftriaxone completed for UTI  Disposition - ileus after bowel surgery   LOS: 7 days   I reviewed last 24 h vitals and pain scores, last 48 h intake and output, last 24 h labs and trends, and last 24 h imaging results.  This care required high  level of medical decision making.   Ranae [facility] at [facility] 01/24/2024, 10:08 AM Please see Amion for pager number during day hours 7:00am-4:30pm or 7:00am -11:30am on weekends

## 2024-01-24 NOTE — Progress Notes (Signed)
 PROGRESS NOTE    [name]  JWJ:[id number] DOB: 1936-04-07 DOA: 01/17/2024 PCP: Jackie Plum, MD   Brief Narrative:  [name] is a 88 y.o. 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  Assessment and Plan:  SBO 2/2 to large ileocecal/ascending colonic mass--colonic adenocarcinoma: CT abdomen and pelvis done consistent with small bowel obstruction secondary to large ileocecal/ascending colonic mass measuring up to 7.3 cm.  No perforation noted.  Enlarged mesenteric nodes in the vicinity of the colon mass suspicious for metastatic adenopathy.  Indeterminate splenic masses measuring up to 2.7 cm centimeters. NG tube replaced today as the prior one was not working and she had worsening abdominal distention. Patient seen in consultation by general surgery who assessed patient and patient underwent open right hemicolectomy with ileocolic anastomosis on 01/18/2024.  Pathology consistent with colonic adenocarcinoma, 13 cm extending into pericolonic connective.  Negative surgical margins noted.  13 lymph nodes negative for metastatic carcinoma.  Negative for lymphovascular involvement.  7 separate tubular adenomas without high-grade dysplasia noted. Currently n.p.o. with bowel rest as we are Awaiting bowel function. Med Onc consulted will follow-up outpatient with Dr. Mosetta Putt.  Currently getting TPN as PICC line was placed 317.  Postop ileus: Patient with postop ileus.Patient stated had some flatus this morning as well. Keep electrolytes repleted potassium approximately 4, magnesium approximately 2.  Mobilize.  Per general surgery.    Atypical Chest Pain: Patient noted with complaints of atypical chest pain localized on the left as well as left upper abdominal pain and nonradiating on 01/19/2024. Patient  describes the pain as a gas bubble. Patient with somewhat reproducible chest pain. EKG with no ischemic changes noted.  Cardiac enzymes negative.   -Chest x-ray with increased density in the AP window which may reflect a dilated pulmonary artery, descending thoracic aortic aneurysm or lymphadenopathy.  CT chest recommended.  Stable enlarged cardiac silhouette.  No acute airspace disease.   -CT chest obtained without contrast with marked main pulmonary artery enlargement compatible with pulmonary hypertension relating to radiographic abnormality.  Aorta elongated and tortuous.  No mass or adenopathy in the chest.  Dependent atelectasis with small volume pleural fluid noted.   -Continue IV PPI twice daily, Voltaren gel, simethicone.  -TTE done and showed LVEF of 60-65% with no RWMA and LVDP c/w G1DD and RVSF normal -C/w Supportive care as she is clinically improved.   Iron Deficiency Anemia/Acute Postop Anemia: Hgb/Hct is now stable at 11.5/35.7. Likely postop acute blood loss anemia and also likely partly dilutional as patient placed on IV fluids. Follow H&H. -Transfusion threshold hemoglobin < 8. Daughter stated she had a dark tarry stool. Check FOBT   Hypokalemia/ Hypomagnesemia/Hypophosphatemia: K+ 3.5 this AM, Phos 3.7 and Mg 2.2 this AM. CTM and replete as necessary. Repeat labs in the AM.   Klebsiella UTI: Urinalysis moderate leukocytes, nitrite negative, rare bacteria, WBC 21-50. Urine cultures with 30,000 colonies of Klebsiella pneumonia. S/p post 3 days IV Rocephin.    AKI on CKD stage IIIa: Likely secondary to prerenal azotemia as patient presented with small bowel obstruction, nausea vomiting and has been n.p.o. since admission. BUN/Cr Trend: Recent Labs  Lab 01/17/24 1830 01/18/24 0456 01/19/24 0708 01/20/24 0415 01/21/24 0646 01/22/24 0323 01/23/24 0316  BUN 19 19 19 23 15 8 8  12   CREATININE 1.07* 1.24* 1.69* 1.67* 1.28* 1.28* 1.21*  1.28*  -  Avoid Nephrotoxic Medications,  Contrast Dyes, Hypotension and Dehydration to Ensure Adequate Renal Perfusion and will need to Renally Adjust Meds; Improved with IVF Hydration -Continue to Monitor and Trend Renal Function carefully and repeat CMP in the AM    Well-Controlled Diabetes Mellitus type 2: Hemoglobin A1c 5.7. Patient currently NPO.Continue to Hold home regimen Glucophage. IVF with D5 normal saline at 75 mL/hr had stopped but will resume again for 1 day   Essential Hypertension: -Currently n.p.o. due to small bowel obstruction and unable to take oral intake. Continue to hold home oral antihypertensive medications. Increased IV Metoprolol Tartrate to 7.5 mg every 6 hours. C/w IV Hydralazine 10 mg q4hprn for SBP >180. CTM BP per Protcol. Last BP Reading was    Essential Thrombocytosis: Platelet Count trending up and is now 721. Follows with Hematology, Dr. [name]. Managed on Anagrelide which is currently on hold. Heme/Onc following and recommending CTM and Trend and repeat CBC w/ Diff in the AM   Hypothyroidism: Levothyroxine on hold as patient currently NPO. C/w IV Levothyroxine 50 mcg Daily    HLD: Continue to hold statin and could likely resume on discharge given that she remains NPO   Splenic masses: Indeterminate splenic masses measuring up to 2.7 cm noted on CT abdomen and pelvis. May need a dedicated abdominal MRI for further evaluation once patient is clinically stable.  Hypoalbuminemia: Albumin Trend has gone from 2.8 -> 2.4 -> 2.3 -> 2.2. CTM and Trend and repeat CMP in the AM  Overweight: Complicates overall prognosis and care. Estimated body mass index is 27.58 kg/m as calculated from the following:   Height as of this encounter: 5\' 1"  (1.549 m).   Weight as of this encounter: 66.2 kg. Weight Loss and Dietary Counseling given   DVT prophylaxis: heparin injection 5,000 Units Start: 01/18/24 0600    Code Status: Full Code Family Communication: Discussed with the patient's daughter at  bedside  Disposition Plan:  Level of care: Telemetry Medical Status is: Inpatient Remains inpatient appropriate because: He has per clinical improvement and tolerance of diet and clearance by general surgery   Consultants:  General Surgery Medical Oncology  Procedures:  As delineated as above  Antimicrobials:  Anti-infectives (From admission, onward)    Start     Dose/Rate Route Frequency Ordered Stop   01/18/24 0915  cefoTEtan (CEFOTAN) 2 g in sodium chloride 0.9 % 100 mL IVPB        2 g 200 mL/hr over 30 Minutes Intravenous On call to O.R. 01/18/24 9562 01/18/24 2042   01/18/24 0915  cefTRIAXone (ROCEPHIN) 2 g in sodium chloride 0.9 % 100 mL IVPB        2 g 200 mL/hr over 30 Minutes Intravenous Every 24 hours 01/18/24 0819 01/20/24 1011       Subjective: Seen and examined at bedside and was doing little bit better after her NG tube was placed.  States that she has a new "bulge" near her midline incision.  States that she is also passing flatus and denies any nausea or vomiting currently.  No other concerns or complaints this time and happy that her NG tube is now properly positioned.  Objective: Vitals:   01/24/24 0531 01/24/24 0533 01/24/24 0816 01/24/24 1343  BP:  (!) 159/92 (!) 173/102 (!) 154/96  Pulse:  86 83 85  Resp:  18 18 18   Temp:  98.3 F (36.8 C) 98.2 F (36.8 C) 98.4 F (36.9 C)  TempSrc:  Oral Oral Oral  SpO2:  100% 99% 96%  Weight: 65 kg     Height:        Intake/Output Summary (Last 24 hours) at 01/24/2024 1440 Last data filed at 01/24/2024 1610 Gross per 24 hour  Intake 1476.07 ml  Output 350 ml  Net 1126.07 ml   Filed Weights   01/22/24 1538 01/23/24 0454 01/24/24 0531  Weight: 60.7 kg 66.2 kg 65 kg   Examination: Physical Exam:  Constitutional: Chronically ill-appearing elderly African-American female who appears slightly uncomfortable Respiratory: Diminished to auscultation bilaterally, no wheezing, rales, rhonchi or crackles. Normal  respiratory effort and patient is not tachypenic. No accessory muscle use.  Unlabored breathing Cardiovascular: RRR, no murmurs / rubs / gallops. S1 and S2 auscultated. No extremity edema. 2+ pedal pulses. No carotid bruits.  Abdomen: Soft, tender to palpate and does have some abdominal distention.  Midline honeycomb dressing in place appears clean dry intact.. Bowel sounds positive.  GU: Deferred. Musculoskeletal: No clubbing / cyanosis of digits/nails. No joint deformity upper and lower extremities.  Skin: No rashes, lesions, ulcers on limited skin evaluation. No induration; Warm and dry.  Neurologic: CN 2-12 grossly intact with no focal deficits. Romberg sign and cerebellar reflexes not assessed.  Psychiatric: She is awake and alert  Data Reviewed: I have personally reviewed following labs and imaging studies  CBC: Recent Labs  Lab 01/17/24 1714 01/18/24 0456 01/20/24 0415 01/20/24 1509 01/21/24 0646 01/22/24 0323 01/23/24 0316 01/24/24 0314  WBC 11.9*   < > 10.7*  --  8.9 11.4* 8.9 10.0  NEUTROABS 9.6*  --   --   --  6.6 7.5 5.2 6.3  HGB 9.9*   < > 8.1* 7.9* 7.2* 11.4* 11.5* 10.9*  HCT 31.6*   < > 26.3* 25.5* 23.3* 35.1* 35.7* 33.7*  MCV 81.4   < > 80.2  --  82.3 80.9 81.7 82.0  PLT 465*   < > 392  --  546* 673* 721* 671*   < > = values in this interval not displayed.   Basic Metabolic Panel: Recent Labs  Lab 01/19/24 0708 01/20/24 0415 01/21/24 0646 01/22/24 0323 01/23/24 0316 01/24/24 0314  NA 139 139 140 140 136  137 138  K 3.7 3.7 2.8* 3.0* 3.5  3.5 3.6  CL 106 107 104 101 101  101 104  CO2 25 20* 24 27 25  26 26   GLUCOSE 127* 106* 165* 89 162*  155* 199*  BUN 19 23 15 8 8  12 13   CREATININE 1.69* 1.67* 1.28* 1.28* 1.21*  1.28* 1.03*  CALCIUM 8.1* 8.5* 8.3* 8.6* 8.3*  8.3* 8.5*  MG 1.6* 2.3 2.1 1.6* 2.2 1.8  PHOS 5.4*  --   --  1.7* 3.8  3.7 2.6   GFR: Estimated Creatinine Clearance: 33.9 mL/min (A) (by C-G formula based on SCr of 1.03 mg/dL  (H)). Liver Function Tests: Recent Labs  Lab 01/17/24 1830 01/19/24 0708 01/22/24 0323 01/23/24 0316 01/24/24 0314  AST 20  --   --  19 23  ALT 9  --   --  18 17  ALKPHOS 53  --   --  50 52  BILITOT 0.5  --   --  0.5 0.6  PROT 6.5  --   --  5.8* 5.6*  ALBUMIN 2.8* 2.4* 2.3* 2.2*  2.3* 2.3*   Recent Labs  Lab 01/17/24 1830  LIPASE 17   No results for input(s): "AMMONIA" in the last 168 hours. Coagulation Profile: No results for input(s): "INR", "  PROTIME" in the last 168 hours. Cardiac Enzymes: No results for input(s): "CKTOTAL", "CKMB", "CKMBINDEX", "TROPONINI" in the last 168 hours. BNP (last 3 results) No results for input(s): "PROBNP" in the last 8760 hours. HbA1C: No results for input(s): "HGBA1C" in the last 72 hours. CBG: Recent Labs  Lab 01/23/24 1228 01/23/24 1757 01/24/24 0034 01/24/24 0535 01/24/24 1316  GLUCAP 137* 168* 163* 173* 117*   Lipid Profile: Recent Labs    01/23/24 0316  TRIG 122   Thyroid Function Tests: No results for input(s): "TSH", "T4TOTAL", "FREET4", "T3FREE", "THYROIDAB" in the last 72 hours. Anemia Panel: No results for input(s): "VITAMINB12", "FOLATE", "FERRITIN", "TIBC", "IRON", "RETICCTPCT" in the last 72 hours. Sepsis Labs: No results for input(s): "PROCALCITON", "LATICACIDVEN" in the last 168 hours.  Recent Results (from the past 240 hours)  Urine Culture     Status: Abnormal   Collection Time: 01/17/24  8:10 PM   Specimen: Urine, Clean Catch  Result Value Ref Range Status   Specimen Description URINE, CLEAN CATCH  Final   Special Requests   Final    NONE Performed at [facility], [address], [place], [place] [address]    Culture 30,000 COLONIES/mL KLEBSIELLA PNEUMONIAE (A)  Final   Report Status 01/19/2024 FINAL  Final   Organism ID, Bacteria KLEBSIELLA PNEUMONIAE (A)  Final      Susceptibility   Klebsiella pneumoniae - MIC*    AMPICILLIN RESISTANT Resistant     CEFAZOLIN <=4 SENSITIVE Sensitive      CEFEPIME <=0.12 SENSITIVE Sensitive     CEFTRIAXONE <=0.25 SENSITIVE Sensitive     CIPROFLOXACIN <=0.25 SENSITIVE Sensitive     GENTAMICIN <=1 SENSITIVE Sensitive     IMIPENEM <=0.25 SENSITIVE Sensitive     NITROFURANTOIN 128 RESISTANT Resistant     TRIMETH/SULFA <=20 SENSITIVE Sensitive     AMPICILLIN/SULBACTAM 4 SENSITIVE Sensitive     PIP/TAZO <=4 SENSITIVE Sensitive ug/mL    * 30,000 COLONIES/mL KLEBSIELLA PNEUMONIAE    Radiology Studies: DG Abd 1 View Result Date: 01/24/2024 CLINICAL DATA:  88 year old female nasogastric tube placement. EXAM: ABDOMEN - 1 VIEW COMPARISON:  01/23/2024. FINDINGS: Single view at 0939 hours. Enteric tube terminates in the left upper quadrant, looped at the level of the gastric body. Midline abdominal skin staples again noted. Gas distended small bowel loops not significantly improved from yesterday. Questionably less large bowel gas now. Lung bases not significantly changed. IMPRESSION: 1. Satisfactory enteric tube placement in the stomach. 2. Persistent small bowel obstruction. Electronically Signed   By: Odessa Fleming M.D.   On: 01/24/2024 10:40   DG Abd Portable 1V Result Date: 01/23/2024 CLINICAL DATA:  Bowel obstruction EXAM: PORTABLE ABDOMEN - 1 VIEW COMPARISON:  01/23/2024 at 11:48 a.m. FINDINGS: Two supine frontal views of the abdomen and pelvis are obtained. Partial visualization of the enteric catheter looped back upon itself within the esophagus seen on prior study. Recommend removal. Surgical stables from midline laparotomy. There is persistent gaseous distention of the small bowel, measuring up to 3.6 cm in diameter. Paucity of colonic gas. No masses or abnormal calcifications. IMPRESSION: 1. Stable small bowel obstruction. No change in caliber of the small bowel. 2. Partial visualization of the malpositioned enteric catheter noted on prior exam. Recommend removal and replacement. Findings were called to the patient's nurse, Rayfield Citizen, at 7:43 p.m.  Electronically Signed   By: Sharlet Salina M.D.   On: 01/23/2024 19:56   DG Abd Portable 1V Result Date: 01/23/2024 CLINICAL DATA:  Enteric tube  placement.  Bowel obstruction. EXAM: PORTABLE ABDOMEN - 1 VIEW COMPARISON:  Abdominal x-ray from yesterday. FINDINGS: The enteric tube is looped back on itself in the esophagus with the tip above the thoracic inlet. Unchanged small bowel dilatation. Unchanged cardiomegaly and small left-greater-than-right pleural effusions with bibasilar atelectasis. Unchanged right upper extremity PICC line. IMPRESSION: 1. The enteric tube is looped back on itself in the esophagus with the tip above the thoracic inlet. Recommend repositioning. 2. Unchanged small bowel obstruction. Electronically Signed   By: Obie Dredge M.D.   On: 01/23/2024 14:18   DG Abd Portable 1V Result Date: 01/22/2024 CLINICAL DATA:  NG tube placement. EXAM: PORTABLE ABDOMEN - 1 VIEW COMPARISON:  Radiograph yesterday FINDINGS: Tip and side port of the enteric tube below the diaphragm in the stomach. Gaseous small bowel distention in the central abdomen. Midline skin staples again seen. IMPRESSION: Tip and side port of the enteric tube below the diaphragm in the stomach. Electronically Signed   By: Narda Rutherford M.D.   On: 01/22/2024 21:11   Scheduled Meds:  Chlorhexidine Gluconate Cloth  6 each Topical Daily   heparin  5,000 Units Subcutaneous Q8H   insulin aspart  0-9 Units Subcutaneous Q6H   latanoprost  1 drop Both Eyes QHS   [START ON 01/29/2024] levothyroxine  50 mcg Intravenous Daily   lidocaine  1 patch Transdermal Q24H   metoprolol tartrate  7.5 mg Intravenous Q6H   pantoprazole (PROTONIX) IV  40 mg Intravenous Q12H   simethicone  80 mg Oral QID   sodium chloride flush  10-40 mL Intracatheter Q12H   Continuous Infusions:  dextrose 5 % and 0.9 % NaCl 75 mL/hr at 01/23/24 2000   TPN ADULT (ION) 75 mL/hr at 01/23/24 1734   TPN ADULT (ION)      LOS: 7 days   Marguerita Merles,  DO [facility] Available via Epic secure chat 7am-7pm After these hours, please refer to coverage provider listed on [email or web address] 01/24/2024, 2:40 PM

## 2024-01-24 NOTE — Progress Notes (Signed)
 [facility] night cross cover note:   I was notified by RN that the patient's NG tube has become dislodged/removed.  Per chart review, initial placement of NG tube was noted to be very difficult. Will refrain from overnight NG tube replacement as a result for now.  Additionally, pt's existing picc line is not functioning appropriately. IV team requests order for new picc line as well as order to remove existing picc line. I subsequently completed the picc line placement order set, including order for placement of new picc line in addition to placing the requested order for removal of existing picc line.      Newton Pigg, DO Hospitalist

## 2024-01-24 NOTE — Plan of Care (Signed)

## 2024-01-24 NOTE — Progress Notes (Signed)
 PHARMACY - TOTAL PARENTERAL NUTRITION CONSULT NOTE   Indication: Prolonged Ileus   Patient Measurements: Height: 5\' 1"  (154.9 cm) Weight: 65 kg (143 lb 4.8 oz) IBW/kg (Calculated) : 47.8 TPN AdjBW (KG): 57.6 Body mass index is 27.08 kg/m. Usual weight 131-137 lbs  Assessment:  62 YOF presenting with SBO with ileocecal/colon mass, s/p hemicolectomy 3/14, prolonged ileus post-op with increased abdominal distention. Patient with chronic constipation and 3 days of N/V prior to admission. Patient lost ~10 lbs in 2 months per chart review. Pharmacy consulted for TPN.  PT reports allergy to shrimp only, has no issues with fish therefore will proceed with SMOF lipid and monitor  3/19 Progressive distention and abdominal discomfort yesterday despite NG tube. NGT appears to be malpositioned.   Glucose / Insulin: BG 137-199, 7 units insulin used in last 24h with sSSI; 3/14 A1c 5.7 (metformin PTA) Electrolytes: K 3.6 (goal 4, received 20 mEq IV), CoCa 9.9, Phos 2.6 down, Mg 1.8 down, others wnl Renal: SCr 1.03, BUN wnl Hepatic: LFTs wnl, TG 122 Intake / Output; MIVF: D5NS@75ml /hr; G 0 mL charted, UOP 350 mL charted (likely incomplete) GI Imaging:  3/19 KUB Stable small bowel obstruction  3/18 KUB Gaseous small bowel distention in the central abdomen  3/17 KUB Gaseous small bowel distention in the left abdomen  3/13 CT abd/pelvis - SBO with large ileocecal/ascending colon mass, concern for malignancy GI Surgeries / Procedures:  3/14 ex lap, partial colectomy 3/18 remove misplaced NGT 3/19 IR to replace NGT  Central access: PICC (double lumen) placed 3/17 TPN start date: 3/18  Nutritional Goals: Goal TPN rate is 70 mL/hr, provides 85g protein, 1656 Kcals  RD Assessment:   Estimated Needs Total Energy Estimated Needs: 1500-1700 kcal Total Protein Estimated Needs: 65-85 gm Total Fluid Estimated Needs: >1.5L/day  Current Nutrition:  NPO + TPN   Plan:  Continue TPN at goal 70 mL/hr at  1800, provides 100% of protein and kcal needs  Electrolytes in TPN: increase Na 100 mEq/L, increase K 72 mEq/L (=121 mEq), Ca 5 mEq/L, increase Mg 10 mEq/L, and increase Phos 22 mmol/L. Cl:Ac 1:1 Give Mag IV 2g x1  Add standard MVI and trace elements to TPN Continue Sensitive q6h SSI and adjust as needed  D5/NS to stop at 19:30 per team order  Thiamine 100mg  x5d ( 3/18- 3/22) Monitor TPN labs daily until stable at goal then on Mon/Thurs  Alphia Moh, PharmD, BCPS, [facility] Clinical Pharmacist  Please check AMION for all [facility] Pharmacy phone numbers After 10:00 PM, call Main Pharmacy [phone]

## 2024-01-24 NOTE — Progress Notes (Addendum)
 Spoke with pharmacist Denny Peon, who stated that she would place the order for D10 infusion until TPN could be hung tomorrow 3/21

## 2024-01-24 NOTE — Progress Notes (Addendum)
 Arrived to room at approx 1725 to hang new tpn bag. Found tpn bag connected to patient but not infusing. Educated primary RN that pt is ordered for continuous tpn and therefore it should not be turned off and to contact IV team with any concerns or questions. Assessed RUE where PICC is inserted. Found dressing to be completely off and PICC partially exposed. When inserted on 3/17, 0cm were exposed. PICC now has 3cm exposed. Changed dressing without incident. Contacted PICC nurses who suggested chest xray to verify tip placement before hanging bag of tpn. Chest xray ordered.

## 2024-01-24 NOTE — Progress Notes (Signed)
 PT Cancellation Note  Patient Details Name: [name] MRN: [id number] DOB: [birth year]   Cancelled Treatment:    Reason Eval/Treat Not Completed: Patient at procedure or test/unavailable   Lissa Rowles B Daley Mooradian 01/24/2024, 10:09 AM Merryl Hacker, PT Acute Rehabilitation Services Office: [phone]

## 2024-01-24 NOTE — Progress Notes (Signed)
 Physical Therapy Treatment Patient Details Name: [name] MRN: [id number] DOB: 1936/10/03 Today's Date: 01/24/2024   History of Present Illness 88 yo female admitted 01/17/24 with abdominal pain, SBO. 3/14 open right hemicolectomy with ileocolic anastomosis. PMHx: T2DM, CKD, HTN, hypothyroidism, iron deficiency anemia, essential thrombocytosis, glaucoma.    PT Comments  Pt pleasant and able to walk increased distance  with reliance on RW and perform additional HEP. Pt fatigued end of session with NGT reconnected. Pt encouraged to do as much as she can for herself with ADLs and mobility acutely. Encouraged OOB and ambulation daily. Will continue to follow.      If plan is discharge home, recommend the following: A little help with walking and/or transfers;A little help with bathing/dressing/bathroom;Assistance with cooking/housework;Assist for transportation;Help with stairs or ramp for entrance   Can travel by private vehicle        Equipment Recommendations  Rolling walker (2 wheels);BSC/3in1    Recommendations for Other Services       Precautions / Restrictions Precautions Precautions: Fall;Other (comment) Recall of Precautions/Restrictions: Intact Precaution/Restrictions Comments: NGT     Mobility  Bed Mobility Overal bed mobility: Needs Assistance Bed Mobility: Supine to Sit     Supine to sit: Min assist, Used rails     General bed mobility comments: HOb 15 degrees with min assist to clear trunk to sitting this session, increased time to scoot to EOB    Transfers Overall transfer level: Needs assistance   Transfers: Sit to/from Stand, Bed to chair/wheelchair/BSC Sit to Stand: Contact guard assist Stand pivot transfers: Contact guard assist         General transfer comment: cues for hand placement with pt able to rise from bed, pivot to [facility] with RW present and stand from chair x 5 trials with reliance on UB support. Pt encouraged to perform own clothing  management with toileting    Ambulation/Gait Ambulation/Gait assistance: Contact guard assist Gait Distance (Feet): 200 Feet Assistive device: Rolling walker (2 wheels) Gait Pattern/deviations: Step-through pattern, Decreased stride length, Trunk flexed   Gait velocity interpretation: 1.31 - 2.62 ft/sec, indicative of limited community ambulator   General Gait Details: cues for continued proximity to RW, pt able to self-regulate distance, cues for direction   Stairs             Wheelchair Mobility     Tilt Bed    Modified Rankin (Stroke Patients Only)       Balance Overall balance assessment: Needs assistance Sitting-balance support: Bilateral upper extremity supported, Feet supported Sitting balance-Leahy Scale: Fair Sitting balance - Comments: static sitting EOB   Standing balance support: Bilateral upper extremity supported, Reliant on assistive device for balance, During functional activity Standing balance-Leahy Scale: Poor Standing balance comment: RW in standing                            Communication    Cognition Arousal: Alert Behavior During Therapy: Flat affect   PT - Cognitive impairments: No apparent impairments                       PT - Cognition Comments: pt not very talkative and answers with short responses, difficult to assess. Daughter can be overly helpful at times Following commands: Intact      Cueing Cueing Techniques: Verbal cues  Exercises General Exercises - Lower Extremity Long Arc Quad: AROM, Both, Seated, Strengthening, 20 reps Hip Flexion/Marching: AROM, Both,  15 reps, Seated, Strengthening    General Comments        Pertinent Vitals/Pain Pain Assessment Pain Assessment: 0-10 Faces Pain Scale: Hurts a little bit Pain Location: abdomen Pain Descriptors / Indicators: Sore Pain Intervention(s): Limited activity within patient's tolerance, Monitored during session, Repositioned    Home Living                           Prior Function            PT Goals (current goals can now be found in the care plan section) Progress towards PT goals: Progressing toward goals    Frequency    Min 2X/week      PT Plan      Co-evaluation              AM-PAC PT "6 Clicks" Mobility   Outcome Measure  Help needed turning from your back to your side while in a flat bed without using bedrails?: None Help needed moving from lying on your back to sitting on the side of a flat bed without using bedrails?: A Little Help needed moving to and from a bed to a chair (including a wheelchair)?: A Little Help needed standing up from a chair using your arms (e.g., wheelchair or bedside chair)?: A Little Help needed to walk in hospital room?: A Little Help needed climbing 3-5 steps with a railing? : A Lot 6 Click Score: 18    End of Session   Activity Tolerance: Patient tolerated treatment well Patient left: in chair;with call bell/phone within reach;with family/visitor present Nurse Communication: Mobility status PT Visit Diagnosis: Other abnormalities of gait and mobility (R26.89)     Time: 0454-0981 PT Time Calculation (min) (ACUTE ONLY): 24 min  Charges:    $Gait Training: 8-22 mins $Therapeutic Exercise: 8-22 mins PT General Charges $$ ACUTE PT VISIT: 1 Visit                     Merryl Hacker, PT Acute Rehabilitation Services Office: [phone]    [name] Demaurion Dicioccio 01/24/2024, 1:12 PM

## 2024-01-25 ENCOUNTER — Encounter: Payer: Self-pay | Admitting: Hematology and Oncology

## 2024-01-25 ENCOUNTER — Inpatient Hospital Stay (HOSPITAL_COMMUNITY)

## 2024-01-25 DIAGNOSIS — K56609 Unspecified intestinal obstruction, unspecified as to partial versus complete obstruction: Secondary | ICD-10-CM | POA: Diagnosis not present

## 2024-01-25 DIAGNOSIS — N179 Acute kidney failure, unspecified: Secondary | ICD-10-CM | POA: Diagnosis not present

## 2024-01-25 DIAGNOSIS — N1831 Chronic kidney disease, stage 3a: Secondary | ICD-10-CM | POA: Diagnosis not present

## 2024-01-25 DIAGNOSIS — K9189 Other postprocedural complications and disorders of digestive system: Secondary | ICD-10-CM | POA: Diagnosis not present

## 2024-01-25 LAB — CBC WITH DIFFERENTIAL/PLATELET
Abs Immature Granulocytes: 0.15 10*3/uL — ABNORMAL HIGH (ref 0.00–0.07)
Basophils Absolute: 0.1 10*3/uL (ref 0.0–0.1)
Basophils Relative: 1 %
Eosinophils Absolute: 0.7 10*3/uL — ABNORMAL HIGH (ref 0.0–0.5)
Eosinophils Relative: 7 %
HCT: 35.5 % — ABNORMAL LOW (ref 36.0–46.0)
Hemoglobin: 11.6 g/dL — ABNORMAL LOW (ref 12.0–15.0)
Immature Granulocytes: 1 %
Lymphocytes Relative: 18 %
Lymphs Abs: 2 10*3/uL (ref 0.7–4.0)
MCH: 26.4 pg (ref 26.0–34.0)
MCHC: 32.7 g/dL (ref 30.0–36.0)
MCV: 80.9 fL (ref 80.0–100.0)
Monocytes Absolute: 0.9 10*3/uL (ref 0.1–1.0)
Monocytes Relative: 9 %
Neutro Abs: 7.1 10*3/uL (ref 1.7–7.7)
Neutrophils Relative %: 64 %
Platelets: 677 10*3/uL — ABNORMAL HIGH (ref 150–400)
RBC: 4.39 MIL/uL (ref 3.87–5.11)
RDW: 18.6 % — ABNORMAL HIGH (ref 11.5–15.5)
WBC: 10.9 10*3/uL — ABNORMAL HIGH (ref 4.0–10.5)
nRBC: 0.2 % (ref 0.0–0.2)

## 2024-01-25 LAB — COMPREHENSIVE METABOLIC PANEL
ALT: 17 U/L (ref 0–44)
AST: 27 U/L (ref 15–41)
Albumin: 2.3 g/dL — ABNORMAL LOW (ref 3.5–5.0)
Alkaline Phosphatase: 60 U/L (ref 38–126)
Anion gap: 12 (ref 5–15)
BUN: 16 mg/dL (ref 8–23)
CO2: 24 mmol/L (ref 22–32)
Calcium: 8.7 mg/dL — ABNORMAL LOW (ref 8.9–10.3)
Chloride: 103 mmol/L (ref 98–111)
Creatinine, Ser: 1.22 mg/dL — ABNORMAL HIGH (ref 0.44–1.00)
GFR, Estimated: 43 mL/min — ABNORMAL LOW (ref >60)
Glucose, Bld: 136 mg/dL — ABNORMAL HIGH (ref 70–99)
Potassium: 3.3 mmol/L — ABNORMAL LOW (ref 3.5–5.1)
Sodium: 139 mmol/L (ref 135–145)
Total Bilirubin: 0.6 mg/dL (ref 0.0–1.2)
Total Protein: 5.8 g/dL — ABNORMAL LOW (ref 6.5–8.1)

## 2024-01-25 LAB — GLUCOSE, CAPILLARY
Glucose-Capillary: 141 mg/dL — ABNORMAL HIGH (ref 70–99)
Glucose-Capillary: 141 mg/dL — ABNORMAL HIGH (ref 70–99)
Glucose-Capillary: 142 mg/dL — ABNORMAL HIGH (ref 70–99)
Glucose-Capillary: 117 mg/dL — ABNORMAL HIGH (ref 70–99)

## 2024-01-25 LAB — PHOSPHORUS: Phosphorus: 3.2 mg/dL (ref 2.5–4.6)

## 2024-01-25 LAB — MAGNESIUM: Magnesium: 2.2 mg/dL (ref 1.7–2.4)

## 2024-01-25 MED ORDER — IOHEXOL 350 MG/ML SOLN
60.0000 mL | Freq: Once | INTRAVENOUS | Status: AC | PRN
  Administered 2024-01-25: 60 mL via INTRAVENOUS

## 2024-01-25 MED ORDER — LIDOCAINE HCL 1 % IJ SOLN
INTRAMUSCULAR | Status: AC
  Filled 2024-01-25: qty 20

## 2024-01-25 MED ORDER — POTASSIUM CHLORIDE 10 MEQ/100ML IV SOLN: 10.0000 meq | INTRAVENOUS | Status: DC

## 2024-01-25 MED ORDER — POTASSIUM CHLORIDE 10 MEQ/50ML IV SOLN
10.0000 meq | INTRAVENOUS | Status: DC
  Filled 2024-01-25 (×6): qty 50

## 2024-01-25 MED ORDER — LIDOCAINE HCL 1 % IJ SOLN
20.0000 mL | Freq: Once | INTRAMUSCULAR | Status: AC
Start: 2024-01-25 — End: 2024-01-25
  Administered 2024-01-25: 8 mL via INTRADERMAL

## 2024-01-25 MED ORDER — IOHEXOL 300 MG/ML  SOLN
50.0000 mL | Freq: Once | INTRAMUSCULAR | Status: AC | PRN
  Administered 2024-01-25: 4 mL via INTRAVENOUS

## 2024-01-25 MED ORDER — LIDOCAINE HCL 1 % IJ SOLN
20.0000 mL | Freq: Once | INTRAMUSCULAR | Status: AC
  Administered 2024-01-25: 11 mL via INTRADERMAL
  Filled 2024-01-25: qty 20

## 2024-01-25 MED ORDER — POTASSIUM CHLORIDE 10 MEQ/100ML IV SOLN
INTRAVENOUS | Status: AC
  Administered 2024-01-25: 10 meq
  Filled 2024-01-25: qty 100

## 2024-01-25 MED ORDER — TRAVASOL 10 % IV SOLN
INTRAVENOUS | Status: AC
  Filled 2024-01-25: qty 840

## 2024-01-25 MED ORDER — POTASSIUM CHLORIDE 10 MEQ/100ML IV SOLN
10.0000 meq | INTRAVENOUS | Status: AC
  Administered 2024-01-25 (×5): 10 meq via INTRAVENOUS
  Filled 2024-01-25 (×5): qty 100

## 2024-01-25 NOTE — Progress Notes (Signed)
 The proposed treatment discussed in conference is for discussion purpose only and is not a binding recommendation.  The patients have not been physically examined, or presented with their treatment options.  Therefore, final treatment plans cannot be decided.

## 2024-01-25 NOTE — Procedures (Signed)
 Interventional Radiology Procedure Note  Procedure: LUE DL POWER PICC    Complications: None  Estimated Blood Loss:  0  Findings: TIP SVCRA, READY FOR USE     Sharen Counter, MD

## 2024-01-25 NOTE — Progress Notes (Signed)
 Unsuccessful PICC Placement  At bedside for PICC reinsertion to left arm. Brachial vessel identified and noted to be 14% vein occupancy. Vessel cannulated, guidewire inserted easily. While placing PICC, this write unable to thread the catheter beyond the clavicular region. Multiple attempts to reposition extremity, elevate and lower head of bed with no success. Cephalic vein non-compressible and basilic vessel unable to identify. Site cleaned and dressed. RN aware and will notify MD. Recommend IR for further central access.

## 2024-01-25 NOTE — Progress Notes (Signed)
 Nutrition Follow-up  DOCUMENTATION CODES:   Not applicable  INTERVENTION:   Monitor diet advancement and add supplements as necessary  TPN per pharmacy:  Pharmacy to add renal MVI and 100 mg Thiamine to TPN  Recommend updated standing weight   NUTRITION DIAGNOSIS:   Inadequate oral intake related to cancer and cancer related treatments as evidenced by NPO status.  - Still applicable   GOAL:   Patient will meet greater than or equal to 90% of their needs  - Meeting via TPN   MONITOR:   Diet advancement, Labs, Weight trends, I & O's  REASON FOR ASSESSMENT:   Consult New TPN/TNA  ASSESSMENT:   88 y.o female with PMH of T2DM, CKD stage 3a, HTN, HLD, hypothyroidism, CHF, anemia. Presented with abdominal pain along with nausea and vomiting. Found to have small bowel obstruction secondary to ileocecal colonic mass-suspected for malignancy.  3/13 NGT placed, CT abdomen and pelvis showed SBO with large ileocecal/ascending colon mass, concern for malignancy  3/14 -hemicolectomy with ileocolic anastomosis  3/18 - TPN initiated, pathology confirms colonic adenocarcinoma   Patient resting in bed, daughter at bedside. Unfortunately PICC was not functioning properly and was stopped noon 3/20. Patient to have PICC replaced in IR today. Has been receiving dextrose since then. NGT replaced yesterday but became dislodged again, surgery will leave out and monitor. CT today to evaluate post op ileus.   Daughter reports patient's body weight to be around 130-135 lbs. Last standing weight was 133 lbs. Most recent weight are bed weights and are not accurate, recommend updated standing weight.    Dietary recall: Breakfast: avocado toast with eggs Lunch: vegetables and rice Dinner: Fruit 1 Glucerna per day   Admit weight: 58.3 kg  Current weight: 65 kg - bed weight Last standing weight 60.7 kg     Intake/Output Summary (Last 24 hours) at 01/25/2024 1435 Last data filed at 01/25/2024  1236 Gross per 24 hour  Intake 1943.98 ml  Output 1 ml  Net 1942.98 ml    Nutritionally Relevant Medications: Scheduled Meds:  insulin aspart  0-9 Units Subcutaneous Q6H   latanoprost  1 drop Both Eyes QHS   [START ON 01/29/2024] levothyroxine  50 mcg Intravenous Daily   lidocaine  1 patch Transdermal Q24H   metoprolol tartrate  7.5 mg Intravenous Q6H   pantoprazole (PROTONIX) IV  40 mg Intravenous Q12H   Continuous Infusions:  dextrose 70 mL/hr at 01/25/24 1246   potassium chloride 10 mEq (01/25/24 1302)   TPN ADULT (ION)     Labs Reviewed: Potassium 3.3, Creatinine 1.22, Calcium 8.7,  CBG ranges from 114-173 mg/dL over the last 24 hours HgbA1c 5.7  NUTRITION - FOCUSED PHYSICAL EXAM:  Flowsheet Row Most Recent Value  Orbital Region No depletion  Upper Arm Region No depletion  Thoracic and Lumbar Region No depletion  Buccal Region No depletion  Temple Region Mild depletion  Clavicle Bone Region Mild depletion  Clavicle and Acromion Bone Region No depletion  Scapular Bone Region No depletion  Dorsal Hand No depletion  Patellar Region No depletion  Anterior Thigh Region No depletion  Posterior Calf Region No depletion  Edema (RD Assessment) Mild  Hair Reviewed  Eyes Reviewed  Mouth Reviewed  Skin Reviewed  Nails Reviewed       Diet Order:   Diet Order             Diet NPO time specified Except for: Ice Chips, Other (See Comments)  Diet effective now  EDUCATION NEEDS:   No education needs have been identified at this time  Skin:  Skin Assessment: Skin Integrity Issues: Skin Integrity Issues:: Incisions Incisions: Abdomen  Last BM:  01/22/24  Height:   Ht Readings from Last 1 Encounters:  01/18/24 5\' 1"  (1.549 m)    Weight:   Wt Readings from Last 1 Encounters:  01/24/24 65 kg    Ideal Body Weight:  47.8 kg  BMI:  Body mass index is 27.08 kg/m.  Estimated Nutritional Needs:   Kcal:  1500-1700 kcal  Protein:   65-85 gm  Fluid:  >1.5L/day    Elliot Dally, RD Registered Dietitian  See Amion for more information

## 2024-01-25 NOTE — Progress Notes (Addendum)
 PHARMACY - TOTAL PARENTERAL NUTRITION CONSULT NOTE   Indication: Prolonged Ileus   Patient Measurements: Height: 5\' 1"  (154.9 cm) Weight: 65 kg (143 lb 4.8 oz) IBW/kg (Calculated) : 47.8 TPN AdjBW (KG): 57.6 Body mass index is 27.08 kg/m. Usual weight 131-137 lbs  Assessment:  89 YOF presenting with SBO with ileocecal/colon mass, s/p hemicolectomy 3/14, prolonged ileus post-op with increased abdominal distention. Patient with chronic constipation and 3 days of N/V prior to admission. Patient lost ~10 lbs in 2 months per chart review. Pharmacy consulted for TPN.  PT reports allergy to shrimp only, has no issues with fish therefore will proceed with SMOF lipid and monitor  3/19 Progressive distention and abdominal discomfort yesterday despite NG tube. NGT appears to be malpositioned.  3/21 NGT dislodged/removed, PICC not functioning and TPN stopped ~noon on 3/20, D10 hung in place of new TPN. Spoke with Leonette Most, PA from IR and PICC should be replaced today.  Glucose / Insulin: BG 114-173, 0 units insulin used in last 24h with sSSI; 3/14 A1c 5.7 (metformin PTA) Electrolytes: K 3.3 (goal 4), CoCa 10.1, Phos 3.2 up, Mg 2.2 up, others wnl Renal: SCr 1.22, BUN wnl Hepatic: LFTs wnl, TG 122, alb 2.3 Intake / Output; MIVF: D10 39ml/hr, UOP not charted GI Imaging:  3/19 KUB Stable small bowel obstruction  3/18 KUB Gaseous small bowel distention in the central abdomen  3/17 KUB Gaseous small bowel distention in the left abdomen  3/13 CT abd/pelvis - SBO with large ileocecal/ascending colon mass, concern for malignancy GI Surgeries / Procedures:  3/14 ex lap, partial colectomy 3/18 remove misplaced NGT 3/19 IR to replace NGT  Central access: PICC (double lumen) placed 3/17 TPN start date: 3/18  Nutritional Goals: Goal TPN rate is 70 mL/hr, provides 85g protein, 1656 Kcals  RD Assessment:   Estimated Needs Total Energy Estimated Needs: 1500-1700 kcal Total Protein Estimated Needs: 65-85  gm Total Fluid Estimated Needs: >1.5L/day  Current Nutrition:  NPO + TPN   Plan:  Continue TPN at goal 70 mL/hr at 1800, provides 100% of protein and kcal needs  Electrolytes in TPN: increase Na 100 mEq/L, increase K 72 mEq/L (=121 mEq), decrease Ca 2.5 mEq/L, increase Mg 10 mEq/L, and increase Phos 22 mmol/L. Cl:Ac 1:1 KCl 10 mEq IV q1h x6 Add standard MVI and trace elements to TPN Continue Sensitive q6h SSI and adjust as needed  Thiamine 100mg  x5d ( 3/18- 3/22) Stop D10 when next TPN hung Monitor TPN labs daily until stable at goal then on Mon/Thurs F/u CT A/P results  Thank you for involving pharmacy in this patient's care.  Loura Back, PharmD, BCPS Clinical Pharmacist Clinical phone for 01/25/2024 is [phone] 01/25/2024 7:18 AM

## 2024-01-25 NOTE — Plan of Care (Signed)
   Problem: Activity: Goal: Risk for activity intolerance will decrease Outcome: Progressing   Problem: Nutrition: Goal: Adequate nutrition will be maintained Outcome: Progressing   Problem: Coping: Goal: Level of anxiety will decrease Outcome: Progressing   Problem: Elimination: Goal: Will not experience complications related to bowel motility Outcome: Progressing   Problem: Safety: Goal: Ability to remain free from injury will improve Outcome: Progressing

## 2024-01-25 NOTE — Progress Notes (Signed)
 Occupational Therapy Treatment Patient Details Name: Carolanne Mercier MRN: [id number] DOB: [birth year] Today's Date: 01/25/2024   History of present illness 88 yo female admitted 01/17/24 with abdominal pain, SBO. 3/14 open right hemicolectomy with ileocolic anastomosis. PMHx: T2DM, CKD, HTN, hypothyroidism, iron deficiency anemia, essential thrombocytosis, glaucoma.   OT comments  Patient seen in order to work on activity tolerance. Patient declining ADLs, but willing to work on [place]. Patient with need for CGA with RW, but requiring cues for RW management when negotiating turns. Education provided to daughter on activities she can do in-between sessions to promote increased strength. Recommendation remains apporpriate, OT will follow.       If plan is discharge home, recommend the following:  A little help with walking and/or transfers;A lot of help with bathing/dressing/bathroom;Assistance with feeding;Assist for transportation;Help with stairs or ramp for entrance   Equipment Recommendations  BSC/3in1    Recommendations for Other Services      Precautions / Restrictions Precautions Precautions: Fall;Other (comment) Recall of Precautions/Restrictions: Intact Restrictions Weight Bearing Restrictions Per Provider Order: No       Mobility Bed Mobility Overal bed mobility: Needs Assistance Bed Mobility: Supine to Sit, Sit to Supine     Supine to sit: Contact guard, HOB elevated, Used rails Sit to supine: HOB elevated, Used rails, Min assist   General bed mobility comments: Pt required verbal cues for sequencing and increased time for supine to sit. Pt with good initiation lifting BLE back into bed but required assistance completing task.    Transfers Overall transfer level: Needs assistance Equipment used: Rolling walker (2 wheels) Transfers: Sit to/from Stand Sit to Stand: Contact guard assist           General transfer comment: Pt required verbal cues for B  hand placement and turns     Balance Overall balance assessment: Needs assistance Sitting-balance support: Bilateral upper extremity supported, Feet supported Sitting balance-Leahy Scale: Fair Sitting balance - Comments: static sitting EOB   Standing balance support: Bilateral upper extremity supported, Reliant on assistive device for balance, During functional activity Standing balance-Leahy Scale: Poor Standing balance comment: Pt required support of AD while standing and performing functional mobility                           ADL either performed or assessed with clinical judgement   ADL Overall ADL's : Needs assistance/impaired                         Toilet Transfer: Contact guard assist;Ambulation;Comfort height toilet           Functional mobility during ADLs: Contact guard assist;Rolling walker (2 wheels) General ADL Comments: Patient seen in order to work on activity tolerance. Patient declining ADLs, but willing to work on [place]. Patient with need for CGA with RW, but requiring cues for RW management when negotiating turns. Education provided to daughter on activities she can do in-between sessions to promote increased strength. Recommendation remains apporpriate, OT will follow.    Extremity/Trunk Assessment              Vision   Vision Assessment?: No apparent visual deficits   Perception Perception Perception: Not tested   Praxis Praxis Praxis: Not tested   Communication Communication Communication: Other (comment) Factors Affecting Communication: Reduced clarity of speech   Cognition Arousal: Alert Behavior During Therapy: WFL for tasks assessed/performed Cognition: No apparent impairments  OT - Cognition Comments: Repeated cues, however feel this is due to language barrier                 Following commands: Intact        Cueing   Cueing Techniques: Verbal cues  Exercises       Shoulder Instructions       General Comments      Pertinent Vitals/ Pain       Pain Assessment Pain Assessment: Faces Faces Pain Scale: Hurts a little bit Pain Location: abdomen Pain Descriptors / Indicators: Discomfort, Aching Pain Intervention(s): Monitored during session, Limited activity within patient's tolerance, Repositioned  Home Living                                          Prior Functioning/Environment              Frequency  Min 1X/week        Progress Toward Goals  OT Goals(current goals can now be found in the care plan section)  Progress towards OT goals: Progressing toward goals  Acute Rehab OT Goals Patient Stated Goal: get better OT Goal Formulation: With patient Time For Goal Achievement: 02/03/24 Potential to Achieve Goals: Good  Plan      Co-evaluation                 AM-PAC OT "6 Clicks" Daily Activity     Outcome Measure   Help from another person eating meals?: None Help from another person taking care of personal grooming?: A Little Help from another person toileting, which includes using toliet, bedpan, or urinal?: A Little Help from another person bathing (including washing, rinsing, drying)?: A Lot Help from another person to put on and taking off regular upper body clothing?: A Little Help from another person to put on and taking off regular lower body clothing?: A Lot 6 Click Score: 17    End of Session Equipment Utilized During Treatment: Gait belt;Rolling walker (2 wheels)  OT Visit Diagnosis: Unsteadiness on feet (R26.81);Muscle weakness (generalized) (M62.81)   Activity Tolerance Patient tolerated treatment well   Patient Left in bed;with call bell/phone within reach;with family/visitor present;with bed alarm set   Nurse Communication Mobility status        Time: 4132-4401 OT Time Calculation (min): 18 min  Charges: OT General Charges $OT Visit: 1 Visit OT Treatments $Self  Care/Home Management : 8-22 mins  Pollyann Glen E. Marieliz Strang, OTR/L Acute Rehabilitation Services [phone]   Cherlyn Cushing 01/25/2024, 1:43 PM

## 2024-01-25 NOTE — Progress Notes (Signed)
 OT Cancellation Note  Patient Details Name: [name] MRN: [id number] DOB: 1936-07-22   Cancelled Treatment:    Reason Eval/Treat Not Completed: Other (comment) Patient undergoing procedure in room upon OT arrival. OT will follow back as time permits.   Pollyann Glen E. Uzoma Vivona, OTR/L Acute Rehabilitation Services [phone]   Cherlyn Cushing 01/25/2024, 8:52 AM

## 2024-01-25 NOTE — Plan of Care (Signed)

## 2024-01-25 NOTE — Procedures (Signed)
 PROCEDURE SUMMARY:  Successful placement of dual lumen PICC line to right brachial vein. Length 11cm Tip at axillary vein due to chronic venous occlusion. Unable to pass wire beyond axilla. No complications PICC capped Ready for use. EBL = trace  Please see full dictation in Imaging section for details.   Charnette Younkin S Jeany Seville PA-C 01/25/2024 2:30 PM

## 2024-01-25 NOTE — Progress Notes (Signed)
 PROGRESS NOTE    [name]  GEX:[id number] DOB: 11-Oct-1936 DOA: 01/17/2024 PCP: Jackie Plum, MD   Brief Narrative:  [name] is a 88 y.o. 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  She underwent surgical intervention and pathology was consistent with adenocarcinoma.  Postoperatively she is now having an ileus and issues with her NG as well as her PICC line so NG and PICC line removed.  Repeat CT scan of the abdomen pelvis is pending and patient to be replaced by the interventional radiology team.  Assessment and Plan:  SBO 2/2 to Large ileocecal/ascending colonic mass--colonic adenocarcinoma: CT abdomen and pelvis done consistent with small bowel obstruction secondary to large ileocecal/ascending colonic mass measuring up to 7.3 cm.  No perforation noted.  Enlarged mesenteric nodes in the vicinity of the colon mass suspicious for metastatic adenopathy.  Indeterminate splenic masses measuring up to 2.7 cm centimeters. NG tube replaced today by Radiology given that it was not positioned properly x2. -Patient seen by Gen Surgery who took the patient for open right hemicolectomy with ileocolic anastomosis on 01/18/2024.  Pathology consistent with colonic adenocarcinoma, 13 cm extending into pericolonic connective.  Negative surgical margins noted.  13 lymph nodes negative for metastatic carcinoma.  Negative for lymphovascular involvement.  7 separate tubular adenomas without high-grade dysplasia noted. Currently n.p.o. with bowel rest as we are Awaiting bowel function. Med Onc consulted will follow-up outpatient with Dr. Mosetta Putt.  Was getting TPN but her PICC line malfunction and had to be replaced by the interventional radiology team given that the PICC nurse could not get it.  She now has a dual-lumen  PICC line placement to the right brachial vein and is at the tip of the axillary vein due to chronic venous occlusion and unable to be passed beyond the axilla.  Postop ileus: Patient with postop ileus.Patient stated had some flatus this morning as well. Keep electrolytes repleted potassium approximately 4, magnesium approximately 2.  Mobilize.  Per general surgery. Lost NG Tube so Surgery recommending holding replacement now. Repeat CT Abd/Pelvis done and pending    Atypical Chest Pain: Patient noted with complaints of atypical chest pain localized on the left as well as left upper abdominal pain and nonradiating on 01/19/2024. Patient describes the pain as a gas bubble. Patient with somewhat reproducible chest pain. EKG with no ischemic changes noted.  Cardiac enzymes negative.   -Chest x-ray with increased density in the AP window which may reflect a dilated pulmonary artery, descending thoracic aortic aneurysm or lymphadenopathy.  CT chest recommended.  Stable enlarged cardiac silhouette.  No acute airspace disease.   -CT chest obtained without contrast with marked main pulmonary artery enlargement compatible with pulmonary hypertension relating to radiographic abnormality.  Aorta elongated and tortuous.  No mass or adenopathy in the chest.  Dependent atelectasis with small volume pleural fluid noted.   -Continue IV PPI twice daily, Voltaren gel, simethicone.  -TTE done and showed LVEF of 60-65% with no RWMA and LVDP c/w G1DD and RVSF normal -C/w Supportive care as she is clinically improved.   Iron Deficiency Anemia/Acute Postop Anemia: Hgb/Hct stable now and is 11.6/35.5. Likely postop acute blood loss anemia and also likely partly dilutional as patient placed on IV fluids. Follow H&H. -Transfusion threshold hemoglobin < 8. Daughter stated she had a dark tarry stool. Check FOBT when having Bowel  movements    Hypokalemia/ Hypomagnesemia/Hypophosphatemia: K+ 3.3 this AM, Phos 3.2 and Mg 2.2 this AM.  CTM and replete as necessary. Repeat labs in the AM.   Klebsiella UTI: Urinalysis moderate leukocytes, nitrite negative, rare bacteria, WBC 21-50. Urine cultures with 30,000 colonies of Klebsiella pneumonia. S/p post 3 days IV Rocephin.    AKI on CKD stage IIIa, improving: Likely secondary to prerenal azotemia as patient presented with small bowel obstruction, nausea vomiting and has been n.p.o. since admission. BUN/Cr Trend: Recent Labs  Lab 01/19/24 0708 01/20/24 0415 01/21/24 0646 01/22/24 0323 01/23/24 0316 01/24/24 0314 01/25/24 0614  BUN 19 23 15 8 8  12 13 16   CREATININE 1.69* 1.67* 1.28* 1.28* 1.21*  1.28* 1.03* 1.22*  -Avoid Nephrotoxic Medications, Contrast Dyes, Hypotension and Dehydration to Ensure Adequate Renal Perfusion and will need to Renally Adjust Meds; Improved with IVF Hydration -Continue to Monitor and Trend Renal Function carefully and repeat CMP in the AM    Well-Controlled Diabetes Mellitus type 2: Hemoglobin A1c 5.7. Patient currently NPO.Continue to Hold home regimen Glucophage. IVF now stopped    Essential Hypertension: -Currently n.p.o. due to small bowel obstruction and unable to take oral intake. Continue to hold home oral antihypertensive medications. Increased IV Metoprolol Tartrate to 7.5 mg every 6 hours. C/w IV Hydralazine 10 mg q4hprn for SBP >180. CTM BP per Protcol. Last BP Reading was 133/76    Essential Thrombocytosis: Platelet Count trended up to 721 and is now 671. Follows with Hematology, Dr. [name]. Managed on Anagrelide which is currently on hold. Heme/Onc following and recommending CTM and Trend and repeat CBC w/ Diff in the AM   Hypothyroidism: Levothyroxine on hold as patient currently NPO. C/w IV Levothyroxine 50 mcg Daily    HLD: Continue to hold statin and could likely resume on discharge given that she remains NPO   Splenic masses: Indeterminate splenic masses measuring up to 2.7 cm noted on CT abdomen and pelvis. May need a  dedicated abdominal MRI for further evaluation once patient is clinically stable.  Hypoalbuminemia: Albumin is now 2.3. CTM and Trend and repeat CMP in the AM  Overweight: Complicates overall prognosis and care. Estimated body mass index is 27.08 kg/m as calculated from the following:   Height as of this encounter: 5\' 1"  (1.549 m).   Weight as of this encounter: 65 kg. Weight Loss and Dietary Counseling given   DVT prophylaxis: heparin injection 5,000 Units Start: 01/18/24 0600    Code Status: Full Code Family Communication: Discussed with family at bedside  Disposition Plan:  Level of care: Telemetry Medical Status is: Inpatient Remains inpatient appropriate because: Needs further clinical improvement and clearance by the specialists   Consultants:  General Surgery Medical Oncology  Procedures:  As delineated as above  Antimicrobials:  Anti-infectives (From admission, onward)    Start     Dose/Rate Route Frequency Ordered Stop   01/18/24 0915  cefoTEtan (CEFOTAN) 2 g in sodium chloride 0.9 % 100 mL IVPB        2 g 200 mL/hr over 30 Minutes Intravenous On call to O.R. 01/18/24 7846 01/18/24 2042   01/18/24 0915  cefTRIAXone (ROCEPHIN) 2 g in sodium chloride 0.9 % 100 mL IVPB        2 g 200 mL/hr over 30 Minutes Intravenous Every 24 hours 01/18/24 0819 01/20/24 1011       Subjective: Seen and examined at bedside and had her NG tube out and states that she is doing okay.  States that she is passing some flatus.  No nausea or vomiting currently.  States her abdomen only is painful when it is palpated.  Still has not had a bowel movement.  No other concerns or points this time.  Objective: Vitals:   01/25/24 0932 01/25/24 1143 01/25/24 1705 01/25/24 1932  BP: 135/79 (!) 147/86 (!) 178/103 133/76  Pulse: 79 71 77 80  Resp: 18 18 18 18   Temp: 98.3 F (36.8 C) 98 F (36.7 C) 98 F (36.7 C) 98.4 F (36.9 C)  TempSrc:   Oral Oral  SpO2: 96% 97% 96% 97%  Weight:       Height:        Intake/Output Summary (Last 24 hours) at 01/25/2024 2033 Last data filed at 01/25/2024 4098 Gross per 24 hour  Intake 1886.2 ml  Output 1 ml  Net 1885.2 ml   Filed Weights   01/22/24 1538 01/23/24 0454 01/24/24 0531  Weight: 60.7 kg 66.2 kg 65 kg   Examination: Physical Exam:  Constitutional: Chronically ill-appearing elderly African-American female who appears slightly uncomfortable Respiratory: Diminished to auscultation bilaterally, no wheezing, rales, rhonchi or crackles. Normal respiratory effort and patient is not tachypenic. No accessory muscle use.  Unlabored breathing Cardiovascular: RRR, no murmurs / rubs / gallops. S1 and S2 auscultated. No extremity edema. .  Abdomen: Soft, non-tender, distended secondary to body habitus and a little tender to palpate.  Has an incisional hernia noted.  Bowel sounds positive.  GU: Deferred. Musculoskeletal: No clubbing / cyanosis of digits/nails. No joint deformity upper and lower extremities.  Skin: No rashes, lesions, ulcers on limited skin evaluation. No induration; Warm and dry.  Neurologic: CN 2-12 grossly intact with no focal deficits.  Romberg sign and cerebellar reflexes not assessed.  Psychiatric: Wake and alert  Data Reviewed: I have personally reviewed following labs and imaging studies  CBC: Recent Labs  Lab 01/21/24 0646 01/22/24 0323 01/23/24 0316 01/24/24 0314 01/25/24 0614  WBC 8.9 11.4* 8.9 10.0 10.9*  NEUTROABS 6.6 7.5 5.2 6.3 7.1  HGB 7.2* 11.4* 11.5* 10.9* 11.6*  HCT 23.3* 35.1* 35.7* 33.7* 35.5*  MCV 82.3 80.9 81.7 82.0 80.9  PLT 546* 673* 721* 671* 677*   Basic Metabolic Panel: Recent Labs  Lab 01/19/24 0708 01/20/24 0415 01/21/24 0646 01/22/24 0323 01/23/24 0316 01/24/24 0314 01/25/24 0614  NA 139   < > 140 140 136  137 138 139  K 3.7   < > 2.8* 3.0* 3.5  3.5 3.6 3.3*  CL 106   < > 104 101 101  101 104 103  CO2 25   < > 24 27 25  26 26 24   GLUCOSE 127*   < > 165* 89 162*   155* 199* 136*  BUN 19   < > 15 8 8  12 13 16   CREATININE 1.69*   < > 1.28* 1.28* 1.21*  1.28* 1.03* 1.22*  CALCIUM 8.1*   < > 8.3* 8.6* 8.3*  8.3* 8.5* 8.7*  MG 1.6*   < > 2.1 1.6* 2.2 1.8 2.2  PHOS 5.4*  --   --  1.7* 3.8  3.7 2.6 3.2   < > = values in this interval not displayed.   GFR: Estimated Creatinine Clearance: 28.6 mL/min (A) (by C-G formula based on SCr of 1.22 mg/dL (H)). Liver Function Tests: Recent Labs  Lab 01/19/24 0708 01/22/24 0323 01/23/24 0316 01/24/24 0314 01/25/24 0614  AST  --   --  19 23 27   ALT  --   --  18 17 17   ALKPHOS  --   --  50 52 60  BILITOT  --   --  0.5 0.6 0.6  PROT  --   --  5.8* 5.6* 5.8*  ALBUMIN 2.4* 2.3* 2.2*  2.3* 2.3* 2.3*   No results for input(s): "LIPASE", "AMYLASE" in the last 168 hours. No results for input(s): "AMMONIA" in the last 168 hours. Coagulation Profile: No results for input(s): "INR", "PROTIME" in the last 168 hours. Cardiac Enzymes: No results for input(s): "CKTOTAL", "CKMB", "CKMBINDEX", "TROPONINI" in the last 168 hours. BNP (last 3 results) No results for input(s): "PROBNP" in the last 8760 hours. HbA1C: No results for input(s): "HGBA1C" in the last 72 hours. CBG: Recent Labs  Lab 01/24/24 2350 01/25/24 0604 01/25/24 0935 01/25/24 1143 01/25/24 1712  GLUCAP 146* 141* 141* 142* 117*   Lipid Profile: Recent Labs    01/23/24 0316  TRIG 122   Thyroid Function Tests: No results for input(s): "TSH", "T4TOTAL", "FREET4", "T3FREE", "THYROIDAB" in the last 72 hours. Anemia Panel: No results for input(s): "VITAMINB12", "FOLATE", "FERRITIN", "TIBC", "IRON", "RETICCTPCT" in the last 72 hours. Sepsis Labs: No results for input(s): "PROCALCITON", "LATICACIDVEN" in the last 168 hours.  Recent Results (from the past 240 hours)  Urine Culture     Status: Abnormal   Collection Time: 01/17/24  8:10 PM   Specimen: Urine, Clean Catch  Result Value Ref Range Status   Specimen Description URINE, CLEAN CATCH   Final   Special Requests   Final    NONE Performed at [facility], [address], [place], [place] [address]    Culture 30,000 COLONIES/mL KLEBSIELLA PNEUMONIAE (A)  Final   Report Status 01/19/2024 FINAL  Final   Organism ID, Bacteria KLEBSIELLA PNEUMONIAE (A)  Final      Susceptibility   Klebsiella pneumoniae - MIC*    AMPICILLIN RESISTANT Resistant     CEFAZOLIN <=4 SENSITIVE Sensitive     CEFEPIME <=0.12 SENSITIVE Sensitive     CEFTRIAXONE <=0.25 SENSITIVE Sensitive     CIPROFLOXACIN <=0.25 SENSITIVE Sensitive     GENTAMICIN <=1 SENSITIVE Sensitive     IMIPENEM <=0.25 SENSITIVE Sensitive     NITROFURANTOIN 128 RESISTANT Resistant     TRIMETH/SULFA <=20 SENSITIVE Sensitive     AMPICILLIN/SULBACTAM 4 SENSITIVE Sensitive     PIP/TAZO <=4 SENSITIVE Sensitive ug/mL    * 30,000 COLONIES/mL KLEBSIELLA PNEUMONIAE    Radiology Studies: IR PICC PLACEMENT LEFT >5 YRS INC IMG GUIDE Result Date: 01/25/2024 INDICATION: Request for IR PICC placement after failed PICC attempt at bedside. FOR TPN ACCESS EXAM: ULTRASOUND AND FLUOROSCOPIC GUIDED PICC LINE INSERTION MEDICATIONS: 1% lidocaine 3 mL CONTRAST:  4 mL FLUOROSCOPY: Radiation Exposure Index (as provided by the fluoroscopic device): 7 mGy [place]erma COMPLICATIONS: None immediate. TECHNIQUE: The procedure, risks, benefits, and alternatives were explained to the patient and informed consent was obtained. The right upper extremity was prepped with chlorhexidine in a sterile fashion, and a sterile drape was applied covering the operative field. Maximum barrier sterile technique with sterile gowns and gloves were used for the procedure. A timeout was performed prior to the initiation of the procedure. Local anesthesia was provided with 1% lidocaine. Initial attempt at right upper extremity PICC line failed to obtain central access. PICC line was cut short with the tip in the axillary vein. However, access is for TPN which has to be central. Therefore  this access was removed after unsuccessful attempts at advancing the catheter and guidewire access  centrally related to basal spasm. Attention was directed to the left upper extremity. Under sterile conditions and local anesthesia, left basilic micropuncture access performed. Guidewire advanced centrally. A 5 [place] dilator set advanced. Measurements obtained. Through the access, a double-lumen 5 French 42 cm PICC line was able to be advanced centrally with the tip position at the SVC RA junction. Images obtained for documentation. The catheter easily aspirated and flushed and was secured in place. A dressing was placed. The patient tolerated the procedure well without immediate post procedural complication. FINDINGS: Unsuccessful attempt at placement of a right PICC line centrally. This access was removed. Successful placement of a left upper extremity double-lumen power PICC line centrally. Tip SVC RA junction. IMPRESSION: Successful ultrasound and fluoroscopic guided placement of a left basilic vein approach, 42 cm, 5 French, dual lumen PICC with tip at the superior caval-atrial junction. The PICC line is ready for immediate use. Addendum: After discussion with medical team, the PICC line was intended for parenteral nutrition. Parenteral nutrition can not be given via midline catheter. Catheter will be removed and a left upper extremity PICC line was able to be placed as above. Procedure performed by: Corrin Parker, PA-C initially. Left upper extremity PICC line placed by Dr. Miles Costain Electronically Signed   By: Judie Petit.  Shick M.D.   On: 01/25/2024 16:06   [place] EKG SITE RITE Result Date: 01/24/2024 If Site Rite image not attached, placement could not be confirmed due to current cardiac rhythm.  DG CHEST PORT 1 VIEW Result Date: 01/24/2024 CLINICAL DATA:  Paik flush. EXAM: PORTABLE CHEST 1 VIEW COMPARISON:  Radiograph 01/19/2024, CT 01/20/2024 FINDINGS: Right upper extremity PICC tip is in the of the mediastinum in the  region of the lower brachiocephalic vein. Enteric tube remains in place. Again seen cardiomegaly. Aortic tortuosity. Slight increasing left basilar opacity. Similar right pleural effusion and basilar opacity. No pneumothorax. IMPRESSION: 1. Right upper extremity PICC tip is in the region of the lower brachiocephalic vein. 2. Slight increasing left basilar opacity, may represent atelectasis or increasing effusion. Similar right pleural effusion and basilar opacity. 3. Cardiomegaly. Electronically Signed   By: Narda Rutherford M.D.   On: 01/24/2024 20:27   DG Abd 1 View Result Date: 01/24/2024 CLINICAL DATA:  88 year old female nasogastric tube placement. EXAM: ABDOMEN - 1 VIEW COMPARISON:  01/23/2024. FINDINGS: Single view at 0939 hours. Enteric tube terminates in the left upper quadrant, looped at the level of the gastric body. Midline abdominal skin staples again noted. Gas distended small bowel loops not significantly improved from yesterday. Questionably less large bowel gas now. Lung bases not significantly changed. IMPRESSION: 1. Satisfactory enteric tube placement in the stomach. 2. Persistent small bowel obstruction. Electronically Signed   By: Odessa Fleming M.D.   On: 01/24/2024 10:40   Scheduled Meds:  Chlorhexidine Gluconate Cloth  6 each Topical Daily   heparin  5,000 Units Subcutaneous Q8H   insulin aspart  0-9 Units Subcutaneous Q6H   latanoprost  1 drop Both Eyes QHS   [START ON 01/29/2024] levothyroxine  50 mcg Intravenous Daily   lidocaine  1 patch Transdermal Q24H   metoprolol tartrate  7.5 mg Intravenous Q6H   pantoprazole (PROTONIX) IV  40 mg Intravenous Q12H   simethicone  80 mg Oral QID   sodium chloride flush  10-40 mL Intracatheter Q12H   Continuous Infusions:  TPN ADULT (ION) 70 mL/hr at 01/25/24 1815    LOS: 8 days   Marguerita Merles, DO [facility] Available via [employer]  secure chat 7am-7pm After these hours, please refer to coverage provider listed on [email or web address] 01/25/2024,  8:33 PM

## 2024-01-25 NOTE — Progress Notes (Signed)
  7 Days Post-Op   Chief Complaint/Subjective: NGT replaced under fluoro yesterday but became dislodged again.  Patient denies nausea. Endorses belching. Reports flatus, unsure last time. States she thinks her last BM was 3/19.   AFVSS Objective: Vital signs in last 24 hours: Temp:  [98.2 F (36.8 C)-99 F (37.2 C)] 98.4 F (36.9 C) (03/21 0457) Pulse Rate:  [72-85] 78 (03/21 0457) Resp:  [17-18] 18 (03/21 0457) BP: (122-173)/(66-105) 142/90 (03/21 0457) SpO2:  [96 %-99 %] 96 % (03/21 0457) Last BM Date : 01/22/24 Intake/Output from previous day: 03/20 0701 - 03/21 0700 In: 1524.7 [I.V.:1474.7; IV Piggyback:50] Out: 1 [Urine:1]  PE: Gen: elderly black female, NAD. Resp: nonlabored Card: RRR, no lower extermity edema  Abd: soft, incision C/D/I, moderate distention, bulge at superior aspect of incision - soft, concern for incisional hernia.   Lab Results:  Recent Labs    01/24/24 0314 01/25/24 0614  WBC 10.0 10.9*  HGB 10.9* 11.6*  HCT 33.7* 35.5*  PLT 671* 677*   Recent Labs    01/24/24 0314 01/25/24 0614  NA 138 139  K 3.6 3.3*  CL 104 103  CO2 26 24  GLUCOSE 199* 136*  BUN 13 16  CREATININE 1.03* 1.22*  CALCIUM 8.5* 8.7*   No results for input(s): "LABPROT", "INR" in the last 72 hours.    Component Value Date/Time   NA 139 01/25/2024 0614   K 3.3 (L) 01/25/2024 0614   CL 103 01/25/2024 0614   CO2 24 01/25/2024 0614   GLUCOSE 136 (H) 01/25/2024 0614   BUN 16 01/25/2024 0614   CREATININE 1.22 (H) 01/25/2024 0614   CREATININE 1.26 (H) 11/13/2023 1248   CALCIUM 8.7 (L) 01/25/2024 0614   PROT 5.8 (L) 01/25/2024 0614   ALBUMIN 2.3 (L) 01/25/2024 0614   AST 27 01/25/2024 0614   AST 16 11/13/2023 1248   ALT 17 01/25/2024 0614   ALT 8 11/13/2023 1248   ALKPHOS 60 01/25/2024 0614   BILITOT 0.6 01/25/2024 0614   BILITOT 0.5 11/13/2023 1248   GFRNONAA 43 (L) 01/25/2024 0614   GFRNONAA 42 (L) 11/13/2023 1248   GFRAA 48 (L) 12/02/2018 1255     Assessment/Plan  s/p Procedure(s): COLECTOMY, PARTIAL LAPAROTOMY, EXPLORATORY 01/18/2024  - path: colonic adenocarcinoma, negative margins, 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 Oncology has seen the patient, outpatient F/U Dr. Mosetta Putt.  - afebrile, WBC 10 - NGT fell out, will leave out for now and monitor - CT abdomen/pelvis today to further evaluate prolonged post-op ileus along with suspected incisional hernia. - s/p 2 units pRBC 3/17, hgb maintaining 10-11 - PICC placed 3/17, removed yesterday and getting replaced, continue TPN  FEN - NPO, TPN VTE - heparin 5000 [place] ID - ceftriaxone completed for UTI  Disposition - ileus after bowel surgery, CT today   LOS: 8 days   I reviewed last 24 h vitals and pain scores, last 48 h intake and output, last 24 h labs and trends, and last 24 h imaging results.  This care required high  level of medical decision making.   Francine Graven [facility] at [facility] 01/25/2024, 7:49 AM Please see Amion for pager number during day hours 7:00am-4:30pm or 7:00am -11:30am on weekends

## 2024-01-26 DIAGNOSIS — N179 Acute kidney failure, unspecified: Secondary | ICD-10-CM | POA: Diagnosis not present

## 2024-01-26 DIAGNOSIS — N1831 Chronic kidney disease, stage 3a: Secondary | ICD-10-CM | POA: Diagnosis not present

## 2024-01-26 DIAGNOSIS — K56609 Unspecified intestinal obstruction, unspecified as to partial versus complete obstruction: Secondary | ICD-10-CM | POA: Diagnosis not present

## 2024-01-26 DIAGNOSIS — K9189 Other postprocedural complications and disorders of digestive system: Secondary | ICD-10-CM | POA: Diagnosis not present

## 2024-01-26 LAB — CBC WITH DIFFERENTIAL/PLATELET
Abs Immature Granulocytes: 0.16 10*3/uL — ABNORMAL HIGH (ref 0.00–0.07)
Basophils Absolute: 0.1 10*3/uL (ref 0.0–0.1)
Basophils Relative: 1 %
Eosinophils Absolute: 0.7 10*3/uL — ABNORMAL HIGH (ref 0.0–0.5)
Eosinophils Relative: 6 %
HCT: 33.8 % — ABNORMAL LOW (ref 36.0–46.0)
Hemoglobin: 10.5 g/dL — ABNORMAL LOW (ref 12.0–15.0)
Immature Granulocytes: 2 %
Lymphocytes Relative: 12 %
Lymphs Abs: 1.3 10*3/uL (ref 0.7–4.0)
MCH: 25.9 pg — ABNORMAL LOW (ref 26.0–34.0)
MCHC: 31.1 g/dL (ref 30.0–36.0)
MCV: 83.3 fL (ref 80.0–100.0)
Monocytes Absolute: 0.8 10*3/uL (ref 0.1–1.0)
Monocytes Relative: 8 %
Neutro Abs: 7.8 10*3/uL — ABNORMAL HIGH (ref 1.7–7.7)
Neutrophils Relative %: 71 %
Platelets: 617 10*3/uL — ABNORMAL HIGH (ref 150–400)
RBC: 4.06 MIL/uL (ref 3.87–5.11)
RDW: 18.7 % — ABNORMAL HIGH (ref 11.5–15.5)
WBC: 10.8 10*3/uL — ABNORMAL HIGH (ref 4.0–10.5)
nRBC: 0 % (ref 0.0–0.2)

## 2024-01-26 LAB — COMPREHENSIVE METABOLIC PANEL
ALT: 19 U/L (ref 0–44)
AST: 28 U/L (ref 15–41)
Albumin: 2.2 g/dL — ABNORMAL LOW (ref 3.5–5.0)
Alkaline Phosphatase: 62 U/L (ref 38–126)
Anion gap: 6 (ref 5–15)
BUN: 16 mg/dL (ref 8–23)
CO2: 25 mmol/L (ref 22–32)
Calcium: 8.4 mg/dL — ABNORMAL LOW (ref 8.9–10.3)
Chloride: 106 mmol/L (ref 98–111)
Creatinine, Ser: 1.19 mg/dL — ABNORMAL HIGH (ref 0.44–1.00)
GFR, Estimated: 45 mL/min — ABNORMAL LOW (ref >60)
Glucose, Bld: 167 mg/dL — ABNORMAL HIGH (ref 70–99)
Potassium: 4.6 mmol/L (ref 3.5–5.1)
Sodium: 137 mmol/L (ref 135–145)
Total Bilirubin: 0.5 mg/dL (ref 0.0–1.2)
Total Protein: 5.6 g/dL — ABNORMAL LOW (ref 6.5–8.1)

## 2024-01-26 LAB — GLUCOSE, CAPILLARY
Glucose-Capillary: 132 mg/dL — ABNORMAL HIGH (ref 70–99)
Glucose-Capillary: 138 mg/dL — ABNORMAL HIGH (ref 70–99)
Glucose-Capillary: 140 mg/dL — ABNORMAL HIGH (ref 70–99)
Glucose-Capillary: 149 mg/dL — ABNORMAL HIGH (ref 70–99)
Glucose-Capillary: 153 mg/dL — ABNORMAL HIGH (ref 70–99)
Glucose-Capillary: 159 mg/dL — ABNORMAL HIGH (ref 70–99)

## 2024-01-26 LAB — MAGNESIUM: Magnesium: 2.1 mg/dL (ref 1.7–2.4)

## 2024-01-26 LAB — PHOSPHORUS: Phosphorus: 3.3 mg/dL (ref 2.5–4.6)

## 2024-01-26 MED ORDER — CLOTRIMAZOLE 2 % VA CREA
1.0000 | TOPICAL_CREAM | Freq: Every day | VAGINAL | Status: AC
  Administered 2024-01-26 – 2024-01-28 (×3): 1 via VAGINAL
  Filled 2024-01-26: qty 21

## 2024-01-26 MED ORDER — LOSARTAN POTASSIUM 50 MG PO TABS
50.0000 mg | ORAL_TABLET | Freq: Every day | ORAL | Status: DC
  Administered 2024-01-26 – 2024-01-30 (×5): 50 mg via ORAL
  Filled 2024-01-26 (×5): qty 1

## 2024-01-26 MED ORDER — LEVOTHYROXINE SODIUM 100 MCG PO TABS
100.0000 ug | ORAL_TABLET | Freq: Every day | ORAL | Status: DC
  Administered 2024-01-27 – 2024-01-30 (×4): 100 ug via ORAL
  Filled 2024-01-26 (×4): qty 1

## 2024-01-26 MED ORDER — TRAVASOL 10 % IV SOLN
INTRAVENOUS | Status: AC
  Filled 2024-01-26: qty 840

## 2024-01-26 MED ORDER — AMLODIPINE BESYLATE 5 MG PO TABS
5.0000 mg | ORAL_TABLET | Freq: Every day | ORAL | Status: DC
  Administered 2024-01-26 – 2024-01-30 (×5): 5 mg via ORAL
  Filled 2024-01-26 (×5): qty 1

## 2024-01-26 NOTE — Progress Notes (Signed)
 8 Days Post-Op   Chief Complaint/Subjective: Reports feeling better. Some flatus when she sits on commode. Reports a large BM this morning. Denies vomiting. Belching decreased.  Two female family members at bedside.   AFVSS Objective: Vital signs in last 24 hours: Temp:  [97.5 F (36.4 C)-98.4 F (36.9 C)] 97.5 F (36.4 C) (03/22 0838) Pulse Rate:  [70-84] 70 (03/22 0838) Resp:  [15-20] 15 (03/22 0838) BP: (132-178)/(76-116) 164/82 (03/22 0838) SpO2:  [96 %-100 %] 96 % (03/22 0838) Weight:  [66.5 kg] 66.5 kg (03/22 0529) Last BM Date : 01/22/24 Intake/Output from previous day: 03/21 0701 - 03/22 0700 In: 2091.3 [I.V.:1606.5; IV Piggyback:484.8] Out: 300 [Urine:300]  PE: Gen: elderly black female, NAD. Resp: nonlabored Card: RRR, no lower extermity edema  Abd: soft, moderate distention - improved compared to prior, soft ventral incisional hernia just above incision - temporarily reduces. No cellulitis. Staples c/d/i  Lab Results:  Recent Labs    01/25/24 0614 01/26/24 0507  WBC 10.9* 10.8*  HGB 11.6* 10.5*  HCT 35.5* 33.8*  PLT 677* 617*   Recent Labs    01/25/24 0614 01/26/24 0507  NA 139 137  K 3.3* 4.6  CL 103 106  CO2 24 25  GLUCOSE 136* 167*  BUN 16 16  CREATININE 1.22* 1.19*  CALCIUM 8.7* 8.4*   No results for input(s): "LABPROT", "INR" in the last 72 hours.    Component Value Date/Time   NA 137 01/26/2024 0507   K 4.6 01/26/2024 0507   CL 106 01/26/2024 0507   CO2 25 01/26/2024 0507   GLUCOSE 167 (H) 01/26/2024 0507   BUN 16 01/26/2024 0507   CREATININE 1.19 (H) 01/26/2024 0507   CREATININE 1.26 (H) 11/13/2023 1248   CALCIUM 8.4 (L) 01/26/2024 0507   PROT 5.6 (L) 01/26/2024 0507   ALBUMIN 2.2 (L) 01/26/2024 0507   AST 28 01/26/2024 0507   AST 16 11/13/2023 1248   ALT 19 01/26/2024 0507   ALT 8 11/13/2023 1248   ALKPHOS 62 01/26/2024 0507   BILITOT 0.5 01/26/2024 0507   BILITOT 0.5 11/13/2023 1248   GFRNONAA 45 (L) 01/26/2024 0507    GFRNONAA 42 (L) 11/13/2023 1248   GFRAA 48 (L) 12/02/2018 1255    Assessment/Plan  s/p Procedure(s): COLECTOMY, PARTIAL LAPAROTOMY, EXPLORATORY 01/18/2024  - path: colonic adenocarcinoma, negative margins, 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 Oncology has seen the patient, outpatient F/U Dr. Mosetta Putt.  - afebrile, WBC 10 - post-op ileus slowly resolving, start CLD, continue TPN - CT abdomen/pelvis 3/21 confirms incisional richter hernia - this does not appear to be causing SBO and it is soft. Discussed risk for incarceration or SBO with family. At this time continue to monitor, abdominal binder ordered.  - s/p 2 units pRBC 3/17, hgb maintaining 10-11 - PICC placed 3/17 but became occluded, replaced by IR 3/21  FEN - CLD, TPN VTE - heparin 5000 [place] ID - ceftriaxone completed for UTI  Disposition - ileus after bowel surgery, incisional hernia containing small bowel - monitor   LOS: 9 days   I reviewed last 24 h vitals and pain scores, last 48 h intake and output, last 24 h labs and trends, and last 24 h imaging results.  This care required high  level of medical decision making.   Francine Graven [facility] Surgery at [facility] 01/26/2024, 8:51 AM Please see Amion for pager number during day hours 7:00am-4:30pm or 7:00am -  11:30am on weekends

## 2024-01-26 NOTE — Progress Notes (Signed)
 PHARMACY - TOTAL PARENTERAL NUTRITION CONSULT NOTE   Indication: Prolonged Ileus   Patient Measurements: Height: 5\' 1"  (154.9 cm) Weight: 66.5 kg (146 lb 9.7 oz) IBW/kg (Calculated) : 47.8 TPN AdjBW (KG): 57.6 Body mass index is 27.7 kg/m. Usual weight 131-137 lbs  Assessment:  6 YOF presenting with SBO with ileocecal/colon mass, s/p hemicolectomy 3/14, prolonged ileus post-op with increased abdominal distention. Patient with chronic constipation and 3 days of N/V prior to admission. Patient lost ~10 lbs in 2 months per chart review. Pharmacy consulted for TPN.  PT reports allergy to shrimp only, has no issues with fish therefore will proceed with SMOF lipid and monitor  3/19 Progressive distention and abdominal discomfort yesterday despite NG tube. NGT appears to be malpositioned.  3/21 NGT dislodged/removed, PICC not functioning and TPN stopped ~noon on 3/20, D10 hung in place of new TPN. Spoke with Leonette Most, PA from IR and PICC replaced by IR. TPN hung at usual time without issues. .  Glucose / Insulin: BG <180, 1 unit insulin used in last 24h; 3/14 A1c 5.7 (metformin PTA) Electrolytes: K 4.6 (goal >/= 4, received 70 mEq IV), CoCa 9.8, others wnl Renal: SCr 1.19, BUN wnl Hepatic: LFTs wnl, TG 122, alb 2.2 Intake / Output; MIVF: D10 @70ml /hr x8hr while PICC being replaced , UOP charted GI Imaging:  3/21 CT: partial colectomy surgical changes. Supraumbilical Richter ventral hernia containing a short segment of small bowel. No definite associated SBO  3/19 KUB: Stable small bowel obstruction  3/18 KUB: Gaseous small bowel distention in the central abdomen  3/17 KUB: Gaseous small bowel distention in the left abdomen  3/13 CT: SBO with large ileocecal/ascending colon mass, concern for malignancy GI Surgeries / Procedures:  3/14 ex lap, partial colectomy 3/18 remove misplaced NGT 3/19 unable to place NGT at bedside  3/20 IR replaced NGT   Central access: PICC (double lumen)  placed 3/17, replaced by IR 3/21  TPN start date: 3/18  Nutritional Goals: Goal TPN rate is 70 mL/hr, provides 85g protein, 1656 Kcals  RD Assessment:   Estimated Needs Total Energy Estimated Needs: 1500-1700 kcal Total Protein Estimated Needs: 65-85 gm Total Fluid Estimated Needs: >1.5L/day  Current Nutrition:  NPO + TPN   Plan:  Continue TPN at goal 70 mL/hr at 1800, provides 100% of protein and kcal needs  Electrolytes in TPN: Na 100 mEq/L, decrease K 35 mEq/L, Ca 2.5 mEq/L, decrease Mg 8 mEq/L, decrease Phos 18 mmol/L. Cl:Ac 1:1  Add standard MVI and trace elements to TPN Stop Sensitive q6h SSI  Monitor TPN labs Mon/Thurs and PRN    Thank you for involving pharmacy in this patient's care.  Alphia Moh, PharmD, BCPS, BCCP Clinical Pharmacist  Please check AMION for all [facility] phone numbers After 10:00 PM, call Main Pharmacy [phone]

## 2024-01-26 NOTE — Progress Notes (Signed)
 Mobility Specialist: Progress Note   01/26/24 1218  Mobility  Activity Ambulated with assistance in hallway  Level of Assistance Contact guard assist, steadying assist  Assistive Device Front wheel walker  Distance Ambulated (ft) 150 ft  Activity Response Tolerated well  Mobility Referral Yes  Mobility visit 1 Mobility  Mobility Specialist Start Time (ACUTE ONLY) 1100  Mobility Specialist Stop Time (ACUTE ONLY) 1125  Mobility Specialist Time Calculation (min) (ACUTE ONLY) 25 min    Pt was agreeable to mobility session - received in bed, daughter at bedside. CG throughout. Requested to use [facility] first. Daughter assisted with pericare. Ambulated in the hallway without complaint. Returned to room without fault. Left in bed with all needs met, call bell in reach.   Maurene Capes Mobility Specialist Please contact via SecureChat or Rehab office at [phone]

## 2024-01-26 NOTE — Plan of Care (Signed)

## 2024-01-26 NOTE — Plan of Care (Signed)

## 2024-01-26 NOTE — Progress Notes (Signed)
 PROGRESS NOTE    [name]  ZOX:[id number] DOB: September 02, 1936 DOA: 01/17/2024 PCP: Jackie Plum, MD   Brief Narrative:  [name] is a 88 y.o. 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  She underwent surgical intervention and pathology was consistent with adenocarcinoma.  Postoperatively she was having an ileus and issues with her NG as well as her PICC line so NG and PICC line removed.  Repeat CT scan of the abdomen pelvis showed no definite small bowel obstruction.  Did show a ventral hernia containing a section of bowel.  Diet is now been advanced to a clear liquid diet.  Assessment and Plan:  SBO 2/2 to Large ileocecal/ascending colonic mass--colonic adenocarcinoma with metastasis: CT abdomen and pelvis done consistent with small bowel obstruction secondary to large ileocecal/ascending colonic mass measuring up to 7.3 cm.  No perforation noted.  Enlarged mesenteric nodes in the vicinity of the colon mass suspicious for metastatic adenopathy.  Indeterminate splenic masses measuring up to 2.7 cm centimeters. NG tube replaced today by Radiology given that it was not positioned properly x2. -Patient seen by Gen Surgery who took the patient for open right hemicolectomy with ileocolic anastomosis on 01/18/2024.  Pathology consistent with colonic adenocarcinoma, 13 cm extending into pericolonic connective.  Negative surgical margins noted.  13 lymph nodes negative for metastatic carcinoma.  Negative for lymphovascular involvement.  7 separate tubular adenomas without high-grade dysplasia noted. Currently n.p.o. with bowel rest as we are Awaiting bowel function. Med Onc consulted will follow-up outpatient with Dr. Mosetta Putt.  Was getting TPN but her PICC line malfunction and had to be replaced by the  interventional radiology team given that the PICC nurse could not get it.  She now has a dual-lumen PICC line placement to the right brachial vein and is at the tip of the axillary vein due to chronic venous occlusion and unable to be passed beyond the axilla.  Postop ileus: Patient with postop ileus.Patient stated had some flatus this morning as well. Keep electrolytes repleted potassium approximately 4, magnesium approximately 2.  Mobilize.  Per general surgery. Lost NG Tube so Surgery recommending holding replacement now. Repeat CT Abd/Pelvis done showed no definite associated small bowel pressure but did show a ventral hernia containing small segment of small bowel.  She does not have send trace bilateral pleural effusions and some ascites as well as diffusely heterogeneous osseous mineralization and other findings.  Diet has not been advanced to a clear liquid diet.   Atypical Chest Pain: Patient noted with complaints of atypical chest pain localized on the left as well as left upper abdominal pain and nonradiating on 01/19/2024. Patient describes the pain as a gas bubble. Patient with somewhat reproducible chest pain. EKG with no ischemic changes noted.  Cardiac enzymes negative.   -Chest x-ray with increased density in the AP window which may reflect a dilated pulmonary artery, descending thoracic aortic aneurysm or lymphadenopathy.  CT chest recommended.  Stable enlarged cardiac silhouette.  No acute airspace disease.   -CT chest obtained without contrast with marked main pulmonary artery enlargement compatible with pulmonary hypertension relating to radiographic abnormality.  Aorta elongated and tortuous.  No mass or adenopathy in the chest.  Dependent atelectasis with small volume pleural fluid noted.   -Continue IV PPI twice daily, Voltaren gel, simethicone.  -TTE done and showed LVEF of 60-65% with  no RWMA and LVDP c/w G1DD and RVSF normal -C/w Supportive care as she is clinically improved.    Iron Deficiency Anemia/Acute Postop Anemia: Hgb/Hct stable but did drom from 11.6/35.5 -> 10.5/33.8. Likely postop acute blood loss anemia and also likely partly dilutional as patient placed on IV fluids. Follow H&H. -Transfusion threshold hemoglobin < 8. Daughter stated she had a dark tarry stool. Check FOBT when having Bowel movements    Hypokalemia/ Hypomagnesemia/Hypophosphatemia: K+ 4.6 this AM, Phos 3.3 and Mg 2.1 this AM. CTM and replete as necessary. Repeat labs in the AM.   Klebsiella UTI: Urinalysis moderate leukocytes, nitrite negative, rare bacteria, WBC 21-50. Urine cultures with 30,000 colonies of Klebsiella pneumonia. S/p post 3 days IV Rocephin.   Vaginal Itching: Ordered    AKI on CKD stage IIIa, improving: Likely secondary to prerenal azotemia as patient presented with small bowel obstruction, nausea vomiting and has been n.p.o. since admission. BUN/Cr Trend: Recent Labs  Lab 01/20/24 0415 01/21/24 0646 01/22/24 0323 01/23/24 0316 01/24/24 0314 01/25/24 0614 01/26/24 0507  BUN 23 15 8 8  12 13 16 16   CREATININE 1.67* 1.28* 1.28* 1.21*  1.28* 1.03* 1.22* 1.19*  -Avoid Nephrotoxic Medications but will resume home Losartan as belwo, Contrast Dyes, Hypotension and Dehydration to Ensure Adequate Renal Perfusion and will need to Renally Adjust Meds; CTM and Trend Renal Function carefully and repeat CMP in the AM    Well-Controlled Diabetes Mellitus type 2: Hemoglobin A1c 5.7. Continue to Hold home regimen Glucophage. IVF now stopped CBGs ranging from 117-159 on last 5 checks   Essential Hypertension: Was n.p.o. due to small bowel obstruction and unable to take oral intake but diet is now advance to CLD. Resume oral antihypertensive medications with po Amlodipine 5 mg daily and Losartan 50 mg po daily. Increased IV Metoprolol Tartrate to 7.5 mg every 6 hours. C/w IV Hydralazine 10 mg q4hprn for SBP >180. CTM BP per Protcol. Last BP Reading was 154/84    Essential  Thrombocytosis: Platelet Count trended up to 721 and is now 671. Follows with Hematology, Dr. [name]. Managed on Anagrelide which is currently on hold. Heme/Onc following and recommending CTM and Trend and repeat CBC w/ Diff in the AM   Hypothyroidism: Will D/C IV Levothyroxine and resume po Levothyroxine in the AM   HLD: Continue to hold statin and could likely resume on discharge given that she remains NPO   Splenic masses: Indeterminate splenic masses measuring up to 2.7 cm noted on CT abdomen and pelvis. Repeat CT Scan Abd/Pelvis done and showed Persistent splenic lesions with the largest heterogeneous 2.8 cm stable mass. Will need an outpatient dedicated abdominal MRI for further evaluation once patient is clinically stable   Hypoalbuminemia: Albumin is now 2.2. CTM and Trend and repeat CMP in the AM  Overweight: Complicates overall prognosis and care. Estimated body mass index is 27.7 kg/m as calculated from the following:   Height as of this encounter: 5\' 1"  (1.549 m).   Weight as of this encounter: 66.5 kg. Weight Loss and Dietary Counseling given   DVT prophylaxis: heparin injection 5,000 Units Start: 01/18/24 0600    Code Status: Full Code Family Communication: Discussed with family at bedside  Disposition Plan:  Level of care: Telemetry Medical Status is: Inpatient Remains inpatient appropriate because: Needs further clinical improvement and clearance by the specialists   Consultants:  General Surgery Medical Oncology  Procedures:  As delineated as above  Antimicrobials:  Anti-infectives (From admission, onward)  Start     Dose/Rate Route Frequency Ordered Stop   01/18/24 0915  cefoTEtan (CEFOTAN) 2 g in sodium chloride 0.9 % 100 mL IVPB        2 g 200 mL/hr over 30 Minutes Intravenous On call to O.R. 01/18/24 1610 01/18/24 2042   01/18/24 0915  cefTRIAXone (ROCEPHIN) 2 g in sodium chloride 0.9 % 100 mL IVPB        2 g 200 mL/hr over 30 Minutes Intravenous Every  24 hours 01/18/24 0819 01/20/24 1011       Subjective: Seen and examined at bedside and states that she is feeling good and had a bowel movement Fahrenheit.  Does not think her stomach is distended today.  No nausea or vomiting.  Surgery is ordering her a abdominal binder.  Objective: Vitals:   01/26/24 0614 01/26/24 0838 01/26/24 1403 01/26/24 1701  BP: (!) 155/88 (!) 164/82 (!) 178/86 (!) 154/84  Pulse: 83 70 81 78  Resp:  15  14  Temp:  (!) 97.5 F (36.4 C)  98.8 F (37.1 C)  TempSrc:  Oral    SpO2:  96%  95%  Weight:      Height:        Intake/Output Summary (Last 24 hours) at 01/26/2024 1926 Last data filed at 01/26/2024 1832 Gross per 24 hour  Intake 1741.78 ml  Output 300 ml  Net 1441.78 ml   Filed Weights   01/23/24 0454 01/24/24 0531 01/26/24 0529  Weight: 66.2 kg 65 kg 66.5 kg   Examination: Physical Exam:  Constitutional: Chronically ill-appearing elderly African-American female who appears a bit more comfortable today compared to yesterday Respiratory: Diminished to auscultation bilaterally, no wheezing, rales, rhonchi or crackles. Normal respiratory effort and patient is not tachypenic. No accessory muscle use.  Unlabored breathing Cardiovascular: RRR, no murmurs / rubs / gallops. S1 and S2 auscultated. No extremity edema. .  Abdomen: Soft, non-tender, distended secondary to body habitus and is a little tender to palpate and she does have a incisional hernia noted.  Bowel sounds positive.  GU: Deferred. Musculoskeletal: No clubbing / cyanosis of digits/nails. No joint deformity upper and lower extremities. Skin: No rashes, lesions, ulcers on limited skin evaluation. No induration; Warm and dry.  Neurologic: CN 2-12 grossly intact with no focal deficits. Romberg sign and cerebellar reflexes not assessed.  Psychiatric: SHe is awake and alert and oriented  Data Reviewed: I have personally reviewed following labs and imaging studies  CBC: Recent Labs  Lab  01/22/24 0323 01/23/24 0316 01/24/24 0314 01/25/24 0614 01/26/24 0507  WBC 11.4* 8.9 10.0 10.9* 10.8*  NEUTROABS 7.5 5.2 6.3 7.1 7.8*  HGB 11.4* 11.5* 10.9* 11.6* 10.5*  HCT 35.1* 35.7* 33.7* 35.5* 33.8*  MCV 80.9 81.7 82.0 80.9 83.3  PLT 673* 721* 671* 677* 617*   Basic Metabolic Panel: Recent Labs  Lab 01/22/24 0323 01/23/24 0316 01/24/24 0314 01/25/24 0614 01/26/24 0507  NA 140 136  137 138 139 137  K 3.0* 3.5  3.5 3.6 3.3* 4.6  CL 101 101  101 104 103 106  CO2 27 25  26 26 24 25   GLUCOSE 89 162*  155* 199* 136* 167*  BUN 8 8  12 13 16 16   CREATININE 1.28* 1.21*  1.28* 1.03* 1.22* 1.19*  CALCIUM 8.6* 8.3*  8.3* 8.5* 8.7* 8.4*  MG 1.6* 2.2 1.8 2.2 2.1  PHOS 1.7* 3.8  3.7 2.6 3.2 3.3   GFR: Estimated Creatinine Clearance: 29.6 mL/min (A) (by C-G  formula based on SCr of 1.19 mg/dL (H)). Liver Function Tests: Recent Labs  Lab 01/22/24 0323 01/23/24 0316 01/24/24 0314 01/25/24 0614 01/26/24 0507  AST  --  19 23 27 28   ALT  --  18 17 17 19   ALKPHOS  --  50 52 60 62  BILITOT  --  0.5 0.6 0.6 0.5  PROT  --  5.8* 5.6* 5.8* 5.6*  ALBUMIN 2.3* 2.2*  2.3* 2.3* 2.3* 2.2*   No results for input(s): "LIPASE", "AMYLASE" in the last 168 hours. No results for input(s): "AMMONIA" in the last 168 hours. Coagulation Profile: No results for input(s): "INR", "PROTIME" in the last 168 hours. Cardiac Enzymes: No results for input(s): "CKTOTAL", "CKMB", "CKMBINDEX", "TROPONINI" in the last 168 hours. BNP (last 3 results) No results for input(s): "PROBNP" in the last 8760 hours. HbA1C: No results for input(s): "HGBA1C" in the last 72 hours. CBG: Recent Labs  Lab 01/25/24 1712 01/26/24 0022 01/26/24 0612 01/26/24 0830 01/26/24 1659  GLUCAP 117* 149* 159* 153* 138*   Lipid Profile: No results for input(s): "CHOL", "HDL", "LDLCALC", "TRIG", "CHOLHDL", "LDLDIRECT" in the last 72 hours. Thyroid Function Tests: No results for input(s): "TSH", "T4TOTAL", "FREET4",  "T3FREE", "THYROIDAB" in the last 72 hours. Anemia Panel: No results for input(s): "VITAMINB12", "FOLATE", "FERRITIN", "TIBC", "IRON", "RETICCTPCT" in the last 72 hours. Sepsis Labs: No results for input(s): "PROCALCITON", "LATICACIDVEN" in the last 168 hours.  Recent Results (from the past 240 hours)  Urine Culture     Status: Abnormal   Collection Time: 01/17/24  8:10 PM   Specimen: Urine, Clean Catch  Result Value Ref Range Status   Specimen Description URINE, CLEAN CATCH  Final   Special Requests   Final    NONE Performed at [facility], [address] [place], [place] [address]    Culture 30,000 COLONIES/mL KLEBSIELLA PNEUMONIAE (A)  Final   Report Status 01/19/2024 FINAL  Final   Organism ID, Bacteria KLEBSIELLA PNEUMONIAE (A)  Final      Susceptibility   Klebsiella pneumoniae - MIC*    AMPICILLIN RESISTANT Resistant     CEFAZOLIN <=4 SENSITIVE Sensitive     CEFEPIME <=0.12 SENSITIVE Sensitive     CEFTRIAXONE <=0.25 SENSITIVE Sensitive     CIPROFLOXACIN <=0.25 SENSITIVE Sensitive     GENTAMICIN <=1 SENSITIVE Sensitive     IMIPENEM <=0.25 SENSITIVE Sensitive     NITROFURANTOIN 128 RESISTANT Resistant     TRIMETH/SULFA <=20 SENSITIVE Sensitive     AMPICILLIN/SULBACTAM 4 SENSITIVE Sensitive     PIP/TAZO <=4 SENSITIVE Sensitive ug/mL    * 30,000 COLONIES/mL KLEBSIELLA PNEUMONIAE    Radiology Studies: CT ABDOMEN PELVIS W CONTRAST Result Date: 01/25/2024 CLINICAL DATA:  Epigastric pain post-op ileus vs SBO. Partial colectomy and laparotomy on 01/18/2024. History of cholecystectomy. EXAM: CT ABDOMEN AND PELVIS WITH CONTRAST TECHNIQUE: Multidetector CT imaging of the abdomen and pelvis was performed using the standard protocol following bolus administration of intravenous contrast. RADIATION DOSE REDUCTION: This exam was performed according to the departmental dose-optimization program which includes automated exposure control, adjustment of the mA and/or kV according to  patient size and/or use of iterative reconstruction technique. CONTRAST:  60mL OMNIPAQUE IOHEXOL 350 MG/ML SOLN COMPARISON:  CT abdomen pelvis 01/17/2024 FINDINGS: Lower chest: Cardiomegaly. Bilateral trace pleural effusions. Bilateral lower lobe passive atelectasis. Small hiatal hernia. Hepatobiliary: No focal liver abnormality. Status post cholecystectomy. No intrahepatic biliary ductal dilatation. Prominent common bile duct measuring up to 1.1 cm which can be seen  in the post cholecystectomy setting. Pancreas: No focal lesion. Normal pancreatic contour. No surrounding inflammatory changes. No main pancreatic ductal dilatation. Spleen: Normal in size. Persistent splenic lesions with the largest heterogeneous 2.8 cm stable mass (3:12). Adrenals/Urinary Tract: No adrenal nodule bilaterally. Bilateral kidneys enhance symmetrically. Fluid density lesions likely represent simple renal cysts. Simple renal cysts, in the absence of clinically indicated signs/symptoms, require no independent follow-up. No hydronephrosis. No hydroureter. The urinary bladder is unremarkable. Stomach/Bowel: Partial colectomy surgical changes noted. Stomach is within normal limits. No evidence of bowel wall thickening or dilatation. Appendix appears normal. Vascular/Lymphatic: No abdominal aorta or iliac aneurysm. Mild atherosclerotic plaque of the aorta and its branches. No abdominal, pelvic, or inguinal lymphadenopathy. Reproductive: Uterus and bilateral adnexa are unremarkable. Other: Trace to small volume simple free fluid ascites. Intraperitoneal free fluid. No intraperitoneal free gas. No organized fluid collection. Musculoskeletal: Supraumbilical ventral hernia containing a short segment of the anti mesenteric wall of the small bowel. Otherwise healing anterior abdominal incision with associated mild subcutaneus soft tissue edema and emphysema consistent with postsurgical changes. Overlying skin staples. Persistent diffusely  heterogeneous osseous mineralization. No definite suspicious lytic or blastic osseous lesions. No acute displaced fracture. Multilevel degenerative changes of the spine. IMPRESSION: Supraumbilical Richter ventral hernia containing a short segment of small bowel. No definite associated small bowel obstruction. Correlate clinically for incarceration. Nonspecific trace to small volume simple free fluid ascites. Bilateral trace pleural effusions. Small hiatal hernia. Persistent splenic lesions with the largest heterogeneous 2.8 cm stable mass. When the patient is clinically stable and able to follow directions and hold their breath (preferably as an outpatient) further evaluation with dedicated abdominal MRI should be considered. Persistent diffusely heterogeneous osseous mineralization. Cardiomegaly. Aortic Atherosclerosis (ICD10-I70.0). Electronically Signed   By: Tish Frederickson M.D.   On: 01/25/2024 20:39   IR PICC PLACEMENT LEFT >5 YRS INC IMG GUIDE Result Date: 01/25/2024 INDICATION: Request for IR PICC placement after failed PICC attempt at bedside. FOR TPN ACCESS EXAM: ULTRASOUND AND FLUOROSCOPIC GUIDED PICC LINE INSERTION MEDICATIONS: 1% lidocaine 3 mL CONTRAST:  4 mL FLUOROSCOPY: Radiation Exposure Index (as provided by the fluoroscopic device): 7 mGy Kerma COMPLICATIONS: None immediate. TECHNIQUE: The procedure, risks, benefits, and alternatives were explained to the patient and informed consent was obtained. The right upper extremity was prepped with chlorhexidine in a sterile fashion, and a sterile drape was applied covering the operative field. Maximum barrier sterile technique with sterile gowns and gloves were used for the procedure. A timeout was performed prior to the initiation of the procedure. Local anesthesia was provided with 1% lidocaine. Initial attempt at right upper extremity PICC line failed to obtain central access. PICC line was cut short with the tip in the axillary vein. However,  access is for TPN which has to be central. Therefore this access was removed after unsuccessful attempts at advancing the catheter and guidewire access centrally related to basal spasm. Attention was directed to the left upper extremity. Under sterile conditions and local anesthesia, left basilic micropuncture access performed. Guidewire advanced centrally. A 5 [place] dilator set advanced. Measurements obtained. Through the access, a double-lumen 5 French 42 cm PICC line was able to be advanced centrally with the tip position at the SVC RA junction. Images obtained for documentation. The catheter easily aspirated and flushed and was secured in place. A dressing was placed. The patient tolerated the procedure well without immediate post procedural complication. FINDINGS: Unsuccessful attempt at placement of a right PICC line centrally. This access was  removed. Successful placement of a left upper extremity double-lumen power PICC line centrally. Tip SVC RA junction. IMPRESSION: Successful ultrasound and fluoroscopic guided placement of a left basilic vein approach, 42 cm, 5 French, dual lumen PICC with tip at the superior caval-atrial junction. The PICC line is ready for immediate use. Addendum: After discussion with medical team, the PICC line was intended for parenteral nutrition. Parenteral nutrition can not be given via midline catheter. Catheter will be removed and a left upper extremity PICC line was able to be placed as above. Procedure performed by: Corrin Parker, PA-C initially. Left upper extremity PICC line placed by Dr. Miles Costain Electronically Signed   By: Judie Petit.  Shick M.D.   On: 01/25/2024 16:06   [place] EKG SITE RITE Result Date: 01/24/2024 If Site Rite image not attached, placement could not be confirmed due to current cardiac rhythm.  Scheduled Meds:  amLODipine  5 mg Oral Daily   Chlorhexidine Gluconate Cloth  6 each Topical Daily   clotrimazole  1 Applicatorful Vaginal QHS   heparin  5,000 Units  Subcutaneous Q8H   latanoprost  1 drop Both Eyes QHS   [START ON 01/27/2024] levothyroxine  100 mcg Oral QAC breakfast   lidocaine  1 patch Transdermal Q24H   losartan  50 mg Oral Daily   metoprolol tartrate  7.5 mg Intravenous Q6H   pantoprazole (PROTONIX) IV  40 mg Intravenous Q12H   simethicone  80 mg Oral QID   sodium chloride flush  10-40 mL Intracatheter Q12H   Continuous Infusions:  TPN ADULT (ION) 70 mL/hr at 01/26/24 1832    LOS: 9 days   Marguerita Merles, DO [facility] Available via Epic secure chat 7am-7pm After these hours, please refer to coverage provider listed on [email or web address] 01/26/2024, 7:26 PM

## 2024-01-27 DIAGNOSIS — N1831 Chronic kidney disease, stage 3a: Secondary | ICD-10-CM | POA: Diagnosis not present

## 2024-01-27 DIAGNOSIS — N179 Acute kidney failure, unspecified: Secondary | ICD-10-CM | POA: Diagnosis not present

## 2024-01-27 DIAGNOSIS — K9189 Other postprocedural complications and disorders of digestive system: Secondary | ICD-10-CM | POA: Diagnosis not present

## 2024-01-27 DIAGNOSIS — K56609 Unspecified intestinal obstruction, unspecified as to partial versus complete obstruction: Secondary | ICD-10-CM | POA: Diagnosis not present

## 2024-01-27 LAB — COMPREHENSIVE METABOLIC PANEL
ALT: 20 U/L (ref 0–44)
AST: 26 U/L (ref 15–41)
Albumin: 2.2 g/dL — ABNORMAL LOW (ref 3.5–5.0)
Alkaline Phosphatase: 59 U/L (ref 38–126)
Anion gap: 7 (ref 5–15)
BUN: 23 mg/dL (ref 8–23)
CO2: 26 mmol/L (ref 22–32)
Calcium: 8.4 mg/dL — ABNORMAL LOW (ref 8.9–10.3)
Chloride: 105 mmol/L (ref 98–111)
Creatinine, Ser: 1.04 mg/dL — ABNORMAL HIGH (ref 0.44–1.00)
GFR, Estimated: 52 mL/min — ABNORMAL LOW (ref >60)
Glucose, Bld: 130 mg/dL — ABNORMAL HIGH (ref 70–99)
Potassium: 4.1 mmol/L (ref 3.5–5.1)
Sodium: 138 mmol/L (ref 135–145)
Total Bilirubin: 0.6 mg/dL (ref 0.0–1.2)
Total Protein: 5.6 g/dL — ABNORMAL LOW (ref 6.5–8.1)

## 2024-01-27 LAB — CBC WITH DIFFERENTIAL/PLATELET
Abs Immature Granulocytes: 0.09 10*3/uL — ABNORMAL HIGH (ref 0.00–0.07)
Basophils Absolute: 0.1 10*3/uL (ref 0.0–0.1)
Basophils Relative: 1 %
Eosinophils Absolute: 0.6 10*3/uL — ABNORMAL HIGH (ref 0.0–0.5)
Eosinophils Relative: 6 %
HCT: 31.2 % — ABNORMAL LOW (ref 36.0–46.0)
Hemoglobin: 9.9 g/dL — ABNORMAL LOW (ref 12.0–15.0)
Immature Granulocytes: 1 %
Lymphocytes Relative: 14 %
Lymphs Abs: 1.4 10*3/uL (ref 0.7–4.0)
MCH: 26.4 pg (ref 26.0–34.0)
MCHC: 31.7 g/dL (ref 30.0–36.0)
MCV: 83.2 fL (ref 80.0–100.0)
Monocytes Absolute: 0.9 10*3/uL (ref 0.1–1.0)
Monocytes Relative: 9 %
Neutro Abs: 6.9 10*3/uL (ref 1.7–7.7)
Neutrophils Relative %: 69 %
Platelets: 557 10*3/uL — ABNORMAL HIGH (ref 150–400)
RBC: 3.75 MIL/uL — ABNORMAL LOW (ref 3.87–5.11)
RDW: 18.9 % — ABNORMAL HIGH (ref 11.5–15.5)
WBC: 9.9 10*3/uL (ref 4.0–10.5)
nRBC: 0 % (ref 0.0–0.2)

## 2024-01-27 LAB — PHOSPHORUS: Phosphorus: 4 mg/dL (ref 2.5–4.6)

## 2024-01-27 LAB — MAGNESIUM: Magnesium: 2.3 mg/dL (ref 1.7–2.4)

## 2024-01-27 LAB — GLUCOSE, CAPILLARY: Glucose-Capillary: 131 mg/dL — ABNORMAL HIGH (ref 70–99)

## 2024-01-27 MED ORDER — ACETAMINOPHEN 325 MG PO TABS
325.0000 mg | ORAL_TABLET | ORAL | Status: DC | PRN
  Administered 2024-01-27 – 2024-01-30 (×3): 325 mg via ORAL
  Filled 2024-01-27 (×3): qty 1

## 2024-01-27 MED ORDER — TRAVASOL 10 % IV SOLN
INTRAVENOUS | Status: AC
Start: 2024-01-27 — End: 2024-01-28
  Filled 2024-01-27: qty 840

## 2024-01-27 MED ORDER — ENSURE ENLIVE PO LIQD
237.0000 mL | Freq: Three times a day (TID) | ORAL | Status: DC
  Administered 2024-01-27 – 2024-01-30 (×7): 237 mL via ORAL

## 2024-01-27 NOTE — Plan of Care (Signed)
  Problem: Activity: Goal: Risk for activity intolerance will decrease Outcome: Progressing   Problem: Coping: Goal: Level of anxiety will decrease Outcome: Progressing   Problem: Elimination: Goal: Will not experience complications related to bowel motility Outcome: Progressing

## 2024-01-27 NOTE — Progress Notes (Addendum)
 PROGRESS NOTE    [name]  ZOX:[id number] DOB: [birth year] DOA: 01/17/2024 PCP: Jackie Plum, MD   Brief Narrative:  [name] is a 88 y.o. 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  She underwent surgical intervention and pathology was consistent with adenocarcinoma.    Postoperatively she was having an ileus and issues with her NG as well as her PICC line so NG now removed and PICC line replaced by interventional radiology.  Repeat CT scan of the abdomen pelvis showed no definite small bowel obstruction.  Did show a ventral hernia containing a section of bowel.  Diet is now been advanced to a clear liquid diet in addition to the TPN and surgery is also adding ensures today.  Assessment and Plan:  SBO 2/2 to Large ileocecal/ascending colonic mass--colonic adenocarcinoma with metastasis: CT abdomen and pelvis done consistent with small bowel obstruction secondary to large ileocecal/ascending colonic mass measuring up to 7.3 cm.  No perforation noted.  Enlarged mesenteric nodes in the vicinity of the colon mass suspicious for metastatic adenopathy.  Indeterminate splenic masses measuring up to 2.7 cm centimeters.  NG tube has been removed -She is status post right hemicolectomy with ileocolic anastomosis on 01/18/2024.  Pathology consistent with colonic adenocarcinoma and she has stage IIa colon cancer status post surgery with only high risk feature being obstruction.  He will follow-up with Dr. Mosetta Putt who is the GI oncologist as an outpatient to discuss the pros and cons of adjuvant Xeloda  -Now on a clear liquid diet and also receiving TPN through her upper extremity dual-lumen power PICC; surgery is adding ensures today -Continue to mobilize  Postop Ileus: Patient with postop ileus.Patient  stated had some flatus this morning as well. Keep electrolytes repleted potassium approximately 4, magnesium approximately 2.  Mobilize.  Per general surgery. Lost NG Tube so Surgery recommending holding replacement now. Repeat CT Abd/Pelvis done showed no definite associated small bowel pressure but did show a ventral hernia containing small segment of small bowel.  She does not have send trace bilateral pleural effusions and some ascites as well as diffusely heterogeneous osseous mineralization and other findings.  Diet has now been advanced to a clear liquid diet and the surgical team is adding Ensure 3 times daily: Surgery also considering starting Reglan and continue to mobilize and out of bed with assistance.   Atypical Chest Pain: Patient noted with complaints of atypical chest pain localized on the left as well as left upper abdominal pain and nonradiating which has since resolved. Cardiac enzymes negative.   -CXR w/ increased density in the AP window which may reflect a dilated pulmonary artery, descending thoracic aortic aneurysm or lymphadenopathy.  CT chest recommended.  Stable enlarged cardiac silhouette.  No acute airspace disease.   -CT chest obtained without contrast with marked main pulmonary artery enlargement compatible with pulmonary hypertension relating to radiographic abnormality.  Aorta elongated and tortuous.  No mass or adenopathy in the chest.  Dependent atelectasis with small volume pleural fluid noted.   -Continue IV PPI twice daily, Voltaren gel, simethicone.  -TTE done and showed LVEF of 60-65% with no RWMA and LVDP c/w G1DD and RVSF normal -C/w Supportive care as she is clinically improved.   Iron Deficiency Anemia/Acute Postop Anemia: Hgb/Hct was stable but now slowly dropping as it went from 11.6/35.5 -> 10.5/33.8 -> 9.9/31.2. Likely  postop acute blood loss anemia and also likely partly dilutional as patient placed on IV fluids. Follow H&H. -Transfusion threshold  hemoglobin < 8. Check FOBT that she is having bowel movements and given concern due to her dark tarry stools prior   Hypokalemia/ Hypomagnesemia/Hypophosphatemia: K+ 4.1 this AM, Phos 4.0 and Mg 2.3 this AM. CTM and replete as necessary. Repeat labs in the AM.   Klebsiella UTI: Urinalysis moderate leukocytes, nitrite negative, rare bacteria, WBC 21-50. Urine cultures with 30,000 colonies of Klebsiella pneumonia. S/p post 3 days IV Rocephin.   Vaginal Itching: Ordered Clotrimazole 2% Vaginal Cream   AKI on CKD stage IIIa, improving: Likely secondary to prerenal azotemia as patient presented with small bowel obstruction, nausea vomiting. BUN/Cr now 23/1.04 today. Avoid Nephrotoxic Medications but will resume home Losartan as belwo, Contrast Dyes, Hypotension and Dehydration to Ensure Adequate Renal Perfusion and will need to Renally Adjust Meds; CTM and Trend Renal Function carefully and repeat CMP in the AM    Well-Controlled Diabetes Mellitus type 2: Hemoglobin A1c 5.7. Continue to Hold home regimen Glucophage. IVF now stopped. CBGs ranging from 131-153 on last 5 checks   Essential Hypertension: Was n.p.o. due to small bowel obstruction and unable to take oral intake but diet is now advance to CLD. Resume oral antihypertensive medications with po Amlodipine 5 mg daily and Losartan 50 mg po daily. Increased IV Metoprolol Tartrate to 7.5 mg every 6 hours. C/w IV Hydralazine 10 mg q4hprn for SBP >180. CTM BP per Protcol. Last BP Reading was 143/81    Essential Thrombocytosis: Platelet Count peaked up to 721 and is now 557. Follows with Hematology, Dr. [name]. Managed on Anagrelide which is currently on hold. Heme/Onc following and recommending CTM and Trend and repeat CBC w/ Diff in the AM   Hypothyroidism: Resumed p.o. Levothyroxine 100 mcg Daily    HLD: Longer n.p.o. but does not appear to be on a statin from [facility] review   Splenic Masses: Indeterminate splenic masses measuring up to 2.7 cm noted on  CT abdomen and pelvis. Repeat CT Scan Abd/Pelvis done and showed Persistent splenic lesions with the largest heterogeneous 2.8 cm stable mass. Will need an outpatient dedicated abdominal MRI for further evaluation once patient is clinically stable medical oncology feels that her indeterminate splenic masses are possibly related to her myeloproliferative disease  Hypoalbuminemia: Albumin is now 2.2 again. CTM and Trend and repeat CMP in the AM  Overweight: Complicates overall prognosis and care. Estimated body mass index is 27.7 kg/m as calculated from the following:   Height as of this encounter: 5\' 1"  (1.549 m).   Weight as of this encounter: 66.5 kg. Weight Loss and Dietary Counseling given   DVT prophylaxis: heparin injection 5,000 Units Start: 01/18/24 0600    Code Status: Full Code Family Communication: Discussed with son at bedside  Disposition Plan:  Level of care: Telemetry Medical Status is: Inpatient Remains inpatient appropriate because: Needs further clinical improvement and clearance by the specialists   Consultants:  General Surgery Medical Oncology  Procedures:  Medical Oncology General Surgery  Antimicrobials:  Anti-infectives (From admission, onward)    Start     Dose/Rate Route Frequency Ordered Stop   01/18/24 0915  cefoTEtan (CEFOTAN) 2 g in sodium chloride 0.9 % 100 mL IVPB        2 g 200 mL/hr over 30 Minutes Intravenous On call to O.R. 01/18/24 1478 01/18/24 2042   01/18/24 0915  cefTRIAXone (ROCEPHIN) 2 g in sodium chloride 0.9 %  100 mL IVPB        2 g 200 mL/hr over 30 Minutes Intravenous Every 24 hours 01/18/24 0819 01/20/24 1011       Subjective: Seen and examined at bedside and she is feeling okay today and states that she is having bowel movements.  States that she is passing flatus 2.  Having ensures today.  No nausea or vomiting.  Feels okay otherwise and ambulating  Objective: Vitals:   01/27/24 0424 01/27/24 0838 01/27/24 1228 01/27/24  1755  BP: (!) 155/81 (!) 158/74 137/84 (!) 143/81  Pulse: 66 67 80 76  Resp: 19 18 18 20   Temp: 98.2 F (36.8 C) 98.4 F (36.9 C) 97.7 F (36.5 C) 98.7 F (37.1 C)  TempSrc: Oral Oral Oral   SpO2: 98% 99% 97% 97%  Weight:      Height:        Intake/Output Summary (Last 24 hours) at 01/27/2024 1943 Last data filed at 01/27/2024 1740 Gross per 24 hour  Intake 1610.53 ml  Output 300 ml  Net 1310.53 ml   Filed Weights   01/23/24 0454 01/24/24 0531 01/26/24 0529  Weight: 66.2 kg 65 kg 66.5 kg   Examination: Physical Exam:  Constitutional: Chronically ill-appearing elderly African-American female who appears calm and comfortable Respiratory: Diminished to auscultation bilaterally with some coarse breath sounds, no wheezing, rales, rhonchi or crackles. Normal respiratory effort and patient is not tachypenic. No accessory muscle use.  Unlabored breathing Cardiovascular: RRR, no murmurs / rubs / gallops. S1 and S2 auscultated. No extremity edema.  Abdomen: Soft, tender to palpate and distended secondary body habitus.  Does have a incisional hernia noted.. Bowel sounds positive.  GU: Deferred. Musculoskeletal: No clubbing / cyanosis of digits/nails. No joint deformity upper and lower extremities. Skin: No rashes, lesions, ulcers on limited skin evaluation. No induration; Warm and dry.  Neurologic: CN 2-12 grossly intact with no focal deficits. Romberg sign and cerebellar reflexes not assessed.  Psychiatric: Pleasant mood and affect is awake and alert  Data Reviewed: I have personally reviewed following labs and imaging studies  CBC: Recent Labs  Lab 01/23/24 0316 01/24/24 0314 01/25/24 0614 01/26/24 0507 01/27/24 0247  WBC 8.9 10.0 10.9* 10.8* 9.9  NEUTROABS 5.2 6.3 7.1 7.8* 6.9  HGB 11.5* 10.9* 11.6* 10.5* 9.9*  HCT 35.7* 33.7* 35.5* 33.8* 31.2*  MCV 81.7 82.0 80.9 83.3 83.2  PLT 721* 671* 677* 617* 557*   Basic Metabolic Panel: Recent Labs  Lab 01/23/24 0316  01/24/24 0314 01/25/24 0614 01/26/24 0507 01/27/24 0247  NA 136  137 138 139 137 138  K 3.5  3.5 3.6 3.3* 4.6 4.1  CL 101  101 104 103 106 105  CO2 25  26 26 24 25 26   GLUCOSE 162*  155* 199* 136* 167* 130*  BUN 8  12 13 16 16 23   CREATININE 1.21*  1.28* 1.03* 1.22* 1.19* 1.04*  CALCIUM 8.3*  8.3* 8.5* 8.7* 8.4* 8.4*  MG 2.2 1.8 2.2 2.1 2.3  PHOS 3.8  3.7 2.6 3.2 3.3 4.0   GFR: Estimated Creatinine Clearance: 33.9 mL/min (A) (by C-G formula based on SCr of 1.04 mg/dL (H)). Liver Function Tests: Recent Labs  Lab 01/23/24 0316 01/24/24 0314 01/25/24 0614 01/26/24 0507 01/27/24 0247  AST 19 23 27 28 26   ALT 18 17 17 19 20   ALKPHOS 50 52 60 62 59  BILITOT 0.5 0.6 0.6 0.5 0.6  PROT 5.8* 5.6* 5.8* 5.6* 5.6*  ALBUMIN 2.2*  2.3*  2.3* 2.3* 2.2* 2.2*   No results for input(s): "LIPASE", "AMYLASE" in the last 168 hours. No results for input(s): "AMMONIA" in the last 168 hours. Coagulation Profile: No results for input(s): "INR", "PROTIME" in the last 168 hours. Cardiac Enzymes: No results for input(s): "CKTOTAL", "CKMB", "CKMBINDEX", "TROPONINI" in the last 168 hours. BNP (last 3 results) No results for input(s): "PROBNP" in the last 8760 hours. HbA1C: No results for input(s): "HGBA1C" in the last 72 hours. CBG: Recent Labs  Lab 01/26/24 0830 01/26/24 1659 01/26/24 2017 01/26/24 2334 01/27/24 0425  GLUCAP 153* 138* 140* 132* 131*   Lipid Profile: No results for input(s): "CHOL", "HDL", "LDLCALC", "TRIG", "CHOLHDL", "LDLDIRECT" in the last 72 hours. Thyroid Function Tests: No results for input(s): "TSH", "T4TOTAL", "FREET4", "T3FREE", "THYROIDAB" in the last 72 hours. Anemia Panel: No results for input(s): "VITAMINB12", "FOLATE", "FERRITIN", "TIBC", "IRON", "RETICCTPCT" in the last 72 hours. Sepsis Labs: No results for input(s): "PROCALCITON", "LATICACIDVEN" in the last 168 hours.  Recent Results (from the past 240 hours)  Urine Culture     Status:  Abnormal   Collection Time: 01/17/24  8:10 PM   Specimen: Urine, Clean Catch  Result Value Ref Range Status   Specimen Description URINE, CLEAN CATCH  Final   Special Requests   Final    NONE Performed at [facility], [address] [place], [place] [address]    Culture 30,000 COLONIES/mL KLEBSIELLA PNEUMONIAE (A)  Final   Report Status 01/19/2024 FINAL  Final   Organism ID, Bacteria KLEBSIELLA PNEUMONIAE (A)  Final      Susceptibility   Klebsiella pneumoniae - MIC*    AMPICILLIN RESISTANT Resistant     CEFAZOLIN <=4 SENSITIVE Sensitive     CEFEPIME <=0.12 SENSITIVE Sensitive     CEFTRIAXONE <=0.25 SENSITIVE Sensitive     CIPROFLOXACIN <=0.25 SENSITIVE Sensitive     GENTAMICIN <=1 SENSITIVE Sensitive     IMIPENEM <=0.25 SENSITIVE Sensitive     NITROFURANTOIN 128 RESISTANT Resistant     TRIMETH/SULFA <=20 SENSITIVE Sensitive     AMPICILLIN/SULBACTAM 4 SENSITIVE Sensitive     PIP/TAZO <=4 SENSITIVE Sensitive ug/mL    * 30,000 COLONIES/mL KLEBSIELLA PNEUMONIAE    Radiology Studies: No results found.  Scheduled Meds:  amLODipine  5 mg Oral Daily   Chlorhexidine Gluconate Cloth  6 each Topical Daily   clotrimazole  1 Applicatorful Vaginal QHS   feeding supplement  237 mL Oral TID WC   heparin  5,000 Units Subcutaneous Q8H   latanoprost  1 drop Both Eyes QHS   levothyroxine  100 mcg Oral QAC breakfast   lidocaine  1 patch Transdermal Q24H   losartan  50 mg Oral Daily   metoprolol tartrate  7.5 mg Intravenous Q6H   pantoprazole (PROTONIX) IV  40 mg Intravenous Q12H   simethicone  80 mg Oral QID   sodium chloride flush  10-40 mL Intracatheter Q12H   Continuous Infusions:  TPN ADULT (ION) 70 mL/hr at 01/27/24 1740    LOS: 10 days   Marguerita Merles, DO [facility] Available via Epic secure chat 7am-7pm After these hours, please refer to coverage provider listed on [email or web address] 01/27/2024, 7:43 PM

## 2024-01-27 NOTE — Progress Notes (Signed)
 S: Patient is sitting up in the chair and reports that she is getting clear liquid diet yesterday and did well and today the plan is to provide her with Ensure and boost to see if she can handle that.  She had a good bowel movement and she is feeling better about her belly.     01/27/2024    8:38 AM 01/27/2024    4:24 AM 01/26/2024   11:28 PM  Vitals with BMI  Systolic 158 155 161  Diastolic 74 81 65  Pulse 67 66 71      Latest Ref Rng & Units 01/27/2024    2:47 AM 01/26/2024    5:07 AM 01/25/2024    6:14 AM  CBC  WBC 4.0 - 10.5 K/uL 9.9  10.8  10.9   Hemoglobin 12.0 - 15.0 g/dL 9.9  09.6  04.5   Hematocrit 36.0 - 46.0 % 31.2  33.8  35.5   Platelets 150 - 400 K/uL 557  617  677       Latest Ref Rng & Units 01/27/2024    2:47 AM 01/26/2024    5:07 AM 01/25/2024    6:14 AM  CMP  Glucose 70 - 99 mg/dL 409  811  914   BUN 8 - 23 mg/dL 23  16  16    Creatinine 0.44 - 1.00 mg/dL 7.82  9.56  2.13   Sodium 135 - 145 mmol/L 138  137  139   Potassium 3.5 - 5.1 mmol/L 4.1  4.6  3.3   Chloride 98 - 111 mmol/L 105  106  103   CO2 22 - 32 mmol/L 26  25  24    Calcium 8.9 - 10.3 mg/dL 8.4  8.4  8.7   Total Protein 6.5 - 8.1 g/dL 5.6  5.6  5.8   Total Bilirubin 0.0 - 1.2 mg/dL 0.6  0.5  0.6   Alkaline Phos 38 - 126 U/L 59  62  60   AST 15 - 41 U/L 26  28  27    ALT 0 - 44 U/L 20  19  17     Assessment and plan: Stage IIa colon cancer status post surgery: Only high risk feature was obstruction.  I discussed with her that we will make an arrangement for her to see Dr. Mosetta Putt our GI oncologist as an outpatient to discuss the pros and cons of adjuvant Xeloda. History of essential thrombocytosis: Previously on anagrelide therapy.  CT abdomen showed indeterminate splenic masses which are possibly related to her myeloproliferative disease.

## 2024-01-27 NOTE — Progress Notes (Signed)
 Mobility Specialist: Progress Note   01/27/24 1300  Mobility  Activity Ambulated with assistance in hallway  Level of Assistance Contact guard assist, steadying assist  Assistive Device Front wheel walker  Distance Ambulated (ft) 300 ft  Activity Response Tolerated well  Mobility Referral Yes  Mobility visit 1 Mobility  Mobility Specialist Start Time (ACUTE ONLY) 1017  Mobility Specialist Stop Time (ACUTE ONLY) 1039  Mobility Specialist Time Calculation (min) (ACUTE ONLY) 22 min    Pt was agreeable to mobility session - received in bed. Family member present and helpful. Had abdominal binder delivered earlier today - donned by family member for mobility. CG initially but pt progressed to SV by EOS. No complaints. Returned to room and requested to use BSC. Completed void and BM - MS assisted with pericare. Left in regular room chair next to bed (per request) with all needs met, call bell in reach.   Maurene Capes Mobility Specialist Please contact via SecureChat or Rehab office at [phone]

## 2024-01-27 NOTE — Plan of Care (Signed)

## 2024-01-27 NOTE — Progress Notes (Signed)
 9 Days Post-Op   Chief Complaint/Subjective: Patient reports minimal flatus, last BM was yesterday morning. Tolerating a liquid diet but endorses early satiety and a lot of fullness/belching last night. Denies vomiting or nausea. Got OOB yesterday.   Son at bedside. Says they asked about abdominal binder but it was never brought to them.    AFVSS Objective: Vital signs in last 24 hours: Temp:  [97.5 F (36.4 C)-99.5 F (37.5 C)] 98.2 F (36.8 C) (03/23 0424) Pulse Rate:  [66-81] 66 (03/23 0424) Resp:  [14-19] 19 (03/23 0424) BP: (135-178)/(65-86) 155/81 (03/23 0424) SpO2:  [95 %-98 %] 98 % (03/23 0424) Last BM Date : 01/26/24 Intake/Output from previous day: 03/22 0701 - 03/23 0700 In: 1591.8 [I.V.:1591.8] Out: 300 [Urine:300]  PE: Gen: elderly black female, NAD. Resp: nonlabored Card: RRR, no lower extermity edema  Abd: soft, moderate distention - improved compared to prior, soft ventral incisional hernia just above incision - temporarily reduces. No cellulitis. Staples c/d/i  Lab Results:  Recent Labs    01/26/24 0507 01/27/24 0247  WBC 10.8* 9.9  HGB 10.5* 9.9*  HCT 33.8* 31.2*  PLT 617* 557*   Recent Labs    01/26/24 0507 01/27/24 0247  NA 137 138  K 4.6 4.1  CL 106 105  CO2 25 26  GLUCOSE 167* 130*  BUN 16 23  CREATININE 1.19* 1.04*  CALCIUM 8.4* 8.4*   No results for input(s): "LABPROT", "INR" in the last 72 hours.    Component Value Date/Time   NA 138 01/27/2024 0247   K 4.1 01/27/2024 0247   CL 105 01/27/2024 0247   CO2 26 01/27/2024 0247   GLUCOSE 130 (H) 01/27/2024 0247   BUN 23 01/27/2024 0247   CREATININE 1.04 (H) 01/27/2024 0247   CREATININE 1.26 (H) 11/13/2023 1248   CALCIUM 8.4 (L) 01/27/2024 0247   PROT 5.6 (L) 01/27/2024 0247   ALBUMIN 2.2 (L) 01/27/2024 0247   AST 26 01/27/2024 0247   AST 16 11/13/2023 1248   ALT 20 01/27/2024 0247   ALT 8 11/13/2023 1248   ALKPHOS 59 01/27/2024 0247   BILITOT 0.6 01/27/2024 0247   BILITOT  0.5 11/13/2023 1248   GFRNONAA 52 (L) 01/27/2024 0247   GFRNONAA 42 (L) 11/13/2023 1248   GFRAA 48 (L) 12/02/2018 1255    Assessment/Plan  s/p Procedure(s): COLECTOMY, PARTIAL LAPAROTOMY, EXPLORATORY 01/18/2024  - path: colonic adenocarcinoma, negative margins, 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 Oncology has seen the patient, outpatient F/U Dr. Mosetta Putt.  - POD#9 - afebrile, WBC 9.9 - CT abdomen/pelvis 3/21 confirms incisional richter hernia - this does not appear to be causing SBO and it is soft. Discussed risk for incarceration or SBO with family. At this time continue to monitor, abdominal binder ordered. I have asked RN and secretary to obtain this morning. - post-op ileus slowly resolving, continue CLD, continue TPN, add ensure; another consideration is started reglan, CT does not suggest obstructive process. Hernia soft. Will discuss with my attending. - s/p 2 units pRBC 3/17, hgb maintaining 10-11 - PICC placed 3/17 but became occluded, replaced by IR 3/21  FEN - CLD, TPN, add ensure TID. Discussed this with RN. VTE - heparin 5000 [place] ID - ceftriaxone completed for UTI  Disposition - ileus after bowel surgery, incisional hernia containing small bowel - monitor   LOS: 10 days   I reviewed last 24 h vitals and pain scores, last 48 h intake  and output, last 24 h labs and trends, and last 24 h imaging results.  This care required high  level of medical decision making.   Francine Graven [facility] Surgery at [facility] 01/27/2024, 8:14 AM Please see Amion for pager number during day hours 7:00am-4:30pm or 7:00am -11:30am on weekends

## 2024-01-27 NOTE — Progress Notes (Signed)
 PHARMACY - TOTAL PARENTERAL NUTRITION CONSULT NOTE   Indication: Prolonged Ileus   Patient Measurements: Height: 5\' 1"  (154.9 cm) Weight: 66.5 kg (146 lb 9.7 oz) IBW/kg (Calculated) : 47.8 TPN AdjBW (KG): 57.6 Body mass index is 27.7 kg/m. Usual weight 131-137 lbs  Assessment:  89 YOF presenting with SBO with ileocecal/colon mass, s/p hemicolectomy 3/14, prolonged ileus post-op with increased abdominal distention. Patient with chronic constipation and 3 days of N/V prior to admission. Patient lost ~10 lbs in 2 months per chart review. Pharmacy consulted for TPN.  PT reports allergy to shrimp only, has no issues with fish therefore will proceed with SMOF lipid and monitor  3/19 Progressive distention and abdominal discomfort yesterday despite NG tube. NGT appears to be malpositioned.  3/21 NGT dislodged/removed, PICC not functioning and TPN stopped ~noon on 3/20, D10 hung in place of new TPN. Spoke with Leonette Most, PA from IR and PICC replaced by IR. TPN hung at usual time without issues. .  Glucose / Insulin: BG <160, off insulin. 3/14 A1c 5.7 (metformin PTA) Electrolytes:  CoCa 9.8, others wnl Renal: SCr 1.04, BUN wnl Hepatic: LFTs wnl, TG 122, alb 2.2 Intake / Output; MIVF: UOP charted, LBM x1 (large) 3/22  GI Imaging:  3/21 CT: partial colectomy surgical changes. Supraumbilical Richter ventral hernia containing a short segment of small bowel. No definite associated SBO  3/19 KUB: Stable small bowel obstruction  3/18 KUB: Gaseous small bowel distention in the central abdomen  3/17 KUB: Gaseous small bowel distention in the left abdomen  3/13 CT: SBO with large ileocecal/ascending colon mass, concern for malignancy GI Surgeries / Procedures:  3/14 ex lap, partial colectomy 3/18 remove misplaced NGT 3/19 unable to place NGT at bedside  3/20 IR replaced NGT   Central access: PICC (double lumen) placed 3/17, replaced by IR 3/21  TPN start date: 3/18  Nutritional Goals: Goal  TPN rate is 70 mL/hr, provides 85g protein, 1656 Kcals  RD Assessment:   Estimated Needs Total Energy Estimated Needs: 1500-1700 kcal Total Protein Estimated Needs: 65-85 gm Total Fluid Estimated Needs: >1.5L/day  Current Nutrition:  TPN  3/22 CLD  Plan:  Continue TPN at goal 70 mL/hr at 1800, provides 100% of protein and kcal needs  Electrolytes in TPN: Na 100 mEq/L, K 35 mEq/L, Ca 2.5 mEq/L, decrease Mg 4 mEq/L, decrease Phos 10 mmol/L. Cl:Ac 1:1  Add standard MVI and trace elements to TPN  Monitor TPN labs Mon/Thurs and PRN   F/u PO intake and wean TPN as able   Thank you for involving pharmacy in this patient's care.  Alphia Moh, PharmD, BCPS, BCCP Clinical Pharmacist  Please check AMION for all [facility] phone numbers After 10:00 PM, call Main Pharmacy [phone]

## 2024-01-28 DIAGNOSIS — N179 Acute kidney failure, unspecified: Secondary | ICD-10-CM | POA: Diagnosis not present

## 2024-01-28 DIAGNOSIS — K9189 Other postprocedural complications and disorders of digestive system: Secondary | ICD-10-CM | POA: Diagnosis not present

## 2024-01-28 DIAGNOSIS — K56609 Unspecified intestinal obstruction, unspecified as to partial versus complete obstruction: Secondary | ICD-10-CM | POA: Diagnosis not present

## 2024-01-28 DIAGNOSIS — N1831 Chronic kidney disease, stage 3a: Secondary | ICD-10-CM | POA: Diagnosis not present

## 2024-01-28 LAB — CBC WITH DIFFERENTIAL/PLATELET
Abs Immature Granulocytes: 0.13 10*3/uL — ABNORMAL HIGH (ref 0.00–0.07)
Basophils Absolute: 0.1 10*3/uL (ref 0.0–0.1)
Basophils Relative: 1 %
Eosinophils Absolute: 0.7 10*3/uL — ABNORMAL HIGH (ref 0.0–0.5)
Eosinophils Relative: 6 %
HCT: 29.4 % — ABNORMAL LOW (ref 36.0–46.0)
Hemoglobin: 9.2 g/dL — ABNORMAL LOW (ref 12.0–15.0)
Immature Granulocytes: 1 %
Lymphocytes Relative: 12 %
Lymphs Abs: 1.3 10*3/uL (ref 0.7–4.0)
MCH: 26.4 pg (ref 26.0–34.0)
MCHC: 31.3 g/dL (ref 30.0–36.0)
MCV: 84.2 fL (ref 80.0–100.0)
Monocytes Absolute: 0.9 10*3/uL (ref 0.1–1.0)
Monocytes Relative: 8 %
Neutro Abs: 8.4 10*3/uL — ABNORMAL HIGH (ref 1.7–7.7)
Neutrophils Relative %: 72 %
Platelets: 479 10*3/uL — ABNORMAL HIGH (ref 150–400)
RBC: 3.49 MIL/uL — ABNORMAL LOW (ref 3.87–5.11)
RDW: 19.3 % — ABNORMAL HIGH (ref 11.5–15.5)
WBC: 11.4 10*3/uL — ABNORMAL HIGH (ref 4.0–10.5)
nRBC: 0 % (ref 0.0–0.2)

## 2024-01-28 LAB — COMPREHENSIVE METABOLIC PANEL
ALT: 16 U/L (ref 0–44)
AST: 22 U/L (ref 15–41)
Albumin: 2.3 g/dL — ABNORMAL LOW (ref 3.5–5.0)
Alkaline Phosphatase: 55 U/L (ref 38–126)
Anion gap: 6 (ref 5–15)
BUN: 29 mg/dL — ABNORMAL HIGH (ref 8–23)
CO2: 26 mmol/L (ref 22–32)
Calcium: 8.5 mg/dL — ABNORMAL LOW (ref 8.9–10.3)
Chloride: 107 mmol/L (ref 98–111)
Creatinine, Ser: 1.12 mg/dL — ABNORMAL HIGH (ref 0.44–1.00)
GFR, Estimated: 48 mL/min — ABNORMAL LOW (ref >60)
Glucose, Bld: 140 mg/dL — ABNORMAL HIGH (ref 70–99)
Potassium: 3.9 mmol/L (ref 3.5–5.1)
Sodium: 139 mmol/L (ref 135–145)
Total Bilirubin: 0.4 mg/dL (ref 0.0–1.2)
Total Protein: 5.5 g/dL — ABNORMAL LOW (ref 6.5–8.1)

## 2024-01-28 LAB — PHOSPHORUS: Phosphorus: 3.9 mg/dL (ref 2.5–4.6)

## 2024-01-28 LAB — TRIGLYCERIDES: Triglycerides: 129 mg/dL (ref <150)

## 2024-01-28 LAB — MAGNESIUM: Magnesium: 2.2 mg/dL (ref 1.7–2.4)

## 2024-01-28 MED ORDER — PANTOPRAZOLE SODIUM 40 MG PO TBEC
40.0000 mg | DELAYED_RELEASE_TABLET | Freq: Two times a day (BID) | ORAL | Status: DC
  Administered 2024-01-28 – 2024-01-30 (×5): 40 mg via ORAL
  Filled 2024-01-28 (×5): qty 1

## 2024-01-28 MED ORDER — TRAVASOL 10 % IV SOLN
INTRAVENOUS | Status: AC
  Filled 2024-01-28: qty 840

## 2024-01-28 NOTE — Progress Notes (Addendum)
 Physical Therapy Treatment Patient Details Name: [name] MRN: [id number] DOB: [birth year] Today's Date: 01/28/2024   History of Present Illness 88 yo female admitted 01/17/24 with abdominal pain, SBO due to colon mass. 3/14 open right hemicolectomy with ileocolic anastomosis. Post-op ileus. PMHx: T2DM, CKD, HTN, hypothyroidism, iron deficiency anemia, essential thrombocytosis, glaucoma.    PT Comments  Pt pleasant and able to increase gait distance and sit to stand trials. Pt educated for and assisted to don abdominal binder prior to mobility with pt stating no pain. Encouraged increased time OOB, walking and continued HEP. Will follow.      If plan is discharge home, recommend the following: A little help with walking and/or transfers;A little help with bathing/dressing/bathroom;Assistance with cooking/housework;Assist for transportation;Help with stairs or ramp for entrance   Can travel by private vehicle        Equipment Recommendations  Rollator;BSC/3in1    Recommendations for Other Services       Precautions / Restrictions Precautions Precautions: Fall;Other (comment) Precaution/Restrictions Comments: abdominal binder for mobility Required Braces or Orthoses: Other Brace     Mobility  Bed Mobility               General bed mobility comments: EOB on arrival    Transfers Overall transfer level: Needs assistance   Transfers: Sit to/from Stand Sit to Stand: Supervision           General transfer comment: cues for hand placement with UB support to rise from bed and 10 repeated sit to stands from recliner with reliance on UB support    Ambulation/Gait Ambulation/Gait assistance: Contact guard assist Gait Distance (Feet): 250 Feet Assistive device: Rolling walker (2 wheels) Gait Pattern/deviations: Step-through pattern, Decreased stride length, Trunk flexed   Gait velocity interpretation: 1.31 - 2.62 ft/sec, indicative of limited community ambulator    General Gait Details: cues for continued proximity to RW, pt able to self-regulate distance, cues for direction   Stairs             Wheelchair Mobility     Tilt Bed    Modified Rankin (Stroke Patients Only)       Balance Overall balance assessment: Needs assistance Sitting-balance support: Bilateral upper extremity supported, Feet supported Sitting balance-Leahy Scale: Fair Sitting balance - Comments: static sitting EOB   Standing balance support: Bilateral upper extremity supported, Reliant on assistive device for balance, During functional activity Standing balance-Leahy Scale: Poor Standing balance comment: RW in standing                            Communication Communication Communication: Impaired Factors Affecting Communication: Reduced clarity of speech  Cognition Arousal: Alert Behavior During Therapy: WFL for tasks assessed/performed   PT - Cognitive impairments: No apparent impairments                       PT - Cognition Comments: pt not very talkative and answers with short responses, difficult to assess, following commands appropriately Following commands: Intact      Cueing Cueing Techniques: Verbal cues  Exercises General Exercises - Lower Extremity Long Arc Quad: AROM, Both, Seated, Strengthening, 15 reps    General Comments        Pertinent Vitals/Pain Pain Assessment Pain Assessment: No/denies pain    Home Living  Prior Function            PT Goals (current goals can now be found in the care plan section) Progress towards PT goals: Progressing toward goals    Frequency    Min 2X/week      PT Plan      Co-evaluation              AM-PAC PT "6 Clicks" Mobility   Outcome Measure  Help needed turning from your back to your side while in a flat bed without using bedrails?: None Help needed moving from lying on your back to sitting on the side of a flat bed  without using bedrails?: A Little Help needed moving to and from a bed to a chair (including a wheelchair)?: A Little Help needed standing up from a chair using your arms (e.g., wheelchair or bedside chair)?: A Little Help needed to walk in hospital room?: A Little Help needed climbing 3-5 steps with a railing? : A Lot 6 Click Score: 18    End of Session   Activity Tolerance: Patient tolerated treatment well Patient left: in chair;with call bell/phone within reach;with family/visitor present Nurse Communication: Mobility status PT Visit Diagnosis: Other abnormalities of gait and mobility (R26.89)     Time: 1096-0454 PT Time Calculation (min) (ACUTE ONLY): 20 min  Charges:    $Gait Training: 8-22 mins PT General Charges $$ ACUTE PT VISIT: 1 Visit                     Merryl Hacker, PT Acute Rehabilitation Services Office: [phone]    Cristine Polio 01/28/2024, 10:54 AM

## 2024-01-28 NOTE — Plan of Care (Signed)

## 2024-01-28 NOTE — Progress Notes (Signed)
 PROGRESS NOTE    [name]  ZOX:[id number] DOB: 21-Jan-1936 DOA: 01/17/2024 PCP: Jackie Plum, MD   Brief Narrative:  [name] is a 88 y.o. 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  She underwent surgical intervention and pathology was consistent with adenocarcinoma.    Postoperatively she was having an ileus and issues with her NG as well as her PICC line so NG now removed and PICC line replaced by I on 3/21.  Repeat CT scan of the abdomen pelvis showed no definite small bowel obstruction  but did show a ventral hernia containing a section of bowel.  Diet is now been advanced to a FULL liquid diet in addition to the TPN and surgery considering advance the diet to soft and weaning TPN in the next day or so.  Assessment and Plan:  SBO 2/2 to Large ileocecal/ascending colonic mass--colonic adenocarcinoma with local metastasis: Initial She is status post right hemicolectomy with ileocolic anastomosis on 01/18/2024 POD 10.  Pathology consistent with colonic adenocarcinoma and she has stage IIa colon cancer status post surgery with only high risk feature being obstruction.  SHe will follow-up with Dr. Mosetta Putt at D/C to discuss the pros and cons of adjuvant Xeloda  -Now on a FULL liquid diet and are considering going to soft today.  Also receiving TPN through her upper extremity dual-lumen power PICC; Continue to mobilize and ambulate  Postop Ileus: Keep electrolytes repleted potassium approximately 4, magnesium approximately 2.  Mobilize. Repeat CT Abd/Pelvis done showed no definite associated small bowel pressure but did show a ventral hernia containing small segment of small bowel.  She does not have send trace bilateral pleural effusions and some ascites as well as diffusely heterogeneous osseous  mineralization and other findings.  Diet has now been advanced to a FULL liquid diet and considering advancing to a soft diet today and also recommending considering weaning TPN tomorrow or Wednesday   Atypical Chest Pain: Cardiac enzymes negative. Continue IV PPI twice daily, Voltaren gel, simethicone. TTE done and showed LVEF of 60-65% with no RWMA and LVDP c/w G1DD and RVSF normal. C/w Supportive care as she is clinically improved.   Iron Deficiency Anemia/Acute Postop Anemia: Hgb/Hct was stable but now slowly dropping as it went from 11.6/35.5 -> 10.5/33.8 -> 9.9/31.2 -> 9.2/29.4. Likely postop acute blood loss anemia and also likely partly dilutional as patient placed on IV fluids. Follow H&H. -Transfusion threshold hemoglobin < 8. Check FOBT that she is having bowel movements and given concern due to her dark tarry stools prior   Hypokalemia/ Hypomagnesemia/Hypophosphatemia: Improved. K+ 3.9 this AM, Phos 3.9 and Mg 2.2 this AM. CTM and replete as necessary. Repeat labs in the AM.   Klebsiella UTI: Urinalysis moderate leukocytes, nitrite negative, rare bacteria, WBC 21-50. Urine cultures with 30,000 colonies of Klebsiella pneumonia. S/p post 3 days IV Ceftriaxone.   Vaginal Itching: Ordered Clotrimazole 2% Vaginal Cream   AKI on CKD stage IIIa, improving: Likely secondary to prerenal azotemia as patient presented with small bowel obstruction, nausea vomiting. BUN/Cr now 23/1.04 today. Avoid Nephrotoxic Medications but will resume home Losartan as belwo, Contrast Dyes, Hypotension and Dehydration to Ensure Adequate Renal Perfusion and will need to Renally Adjust Meds; CTM and Trend Renal Function carefully and repeat CMP in the AM    Well-Controlled DMT2: HbA1c 5.7. Continue to Hold home regimen Glucophage.  IVF now stopped. CBGs ranging from 131-153 on last 5 checks   Essential HTN: Now on FULL Liquid diet. C/w po Amlodipine 5 mg daily and Losartan 50 mg po daily. Increased IV Metoprolol  Tartrate to 7.5 mg every 6 hours. C/w IV Hydralazine 10 mg q4hprn for SBP >180. CTM BP per Protcol. Last BP Reading was 131/68    Essential Thrombocytosis: Plt peaked up to 721 and is now 479. Follows with Hematology, Dr. [name]. Managed on Anagrelide which is currently on hold. CTM and trend and follow-up with heme-onc in outpt setting.   Hypothyroidism: C/w Levothyroxine 100 mcg Daily    HLD: Longer n.p.o. but does not appear to be on a statin from [facility] review   Splenic Masses: Indeterminate splenic masses measuring up to 2.7 cm noted on CT Abd/Pelv. Repeat CT Scan Abd/Pelvis done and showed persistent splenic lesions with the largest heterogeneous 2.8 cm stable mass. Will need an outpatient dedicated abdominal MRI for further evaluation once patient is clinically stable medical oncology feels that her indeterminate splenic masses are possibly related to her myeloproliferative disease  Hypoalbuminemia: Albumin is now 2.3. CTM and Trend and repeat CMP in the AM  Overweight: Complicates overall prognosis and care. Estimated body mass index is 27.7 kg/m as calculated from the following:   Height as of this encounter: 5\' 1"  (1.549 m).   Weight as of this encounter: 66.5 kg. Weight Loss and Dietary Counseling given   DVT prophylaxis: heparin injection 5,000 Units Start: 01/18/24 0600    Code Status: Full Code Family Communication: Discussed with family at bedside  Disposition Plan:  Level of care: Telemetry Medical Status is: Inpatient Remains inpatient appropriate because: Needs further clinical improvement and clearance by the specialists   Consultants:  General Surgery Medical Oncology  Procedures:  As delineated as above  Antimicrobials:  Anti-infectives (From admission, onward)    Start     Dose/Rate Route Frequency Ordered Stop   01/18/24 0915  cefoTEtan (CEFOTAN) 2 g in sodium chloride 0.9 % 100 mL IVPB        2 g 200 mL/hr over 30 Minutes Intravenous On call to O.R.  01/18/24 2536 01/18/24 2042   01/18/24 0915  cefTRIAXone (ROCEPHIN) 2 g in sodium chloride 0.9 % 100 mL IVPB        2 g 200 mL/hr over 30 Minutes Intravenous Every 24 hours 01/18/24 0819 01/20/24 1011       Subjective: Seen and examined at bedside and he is s/p indwellingthat she feels okay.  Continues to have bowel movements and feels fairly overall well.  Tolerating her diet without issues.  No nausea or vomiting.  No other concerns or complaints this time.  Objective: Vitals:   01/28/24 0500 01/28/24 0557 01/28/24 0821 01/28/24 1300  BP:  (!) 153/67 (!) 150/89 131/68  Pulse:  72 71 74  Resp:      Temp:  98.7 F (37.1 C) 98.6 F (37 C)   TempSrc:  Oral    SpO2:  100% 97%   Weight: 66.5 kg     Height:        Intake/Output Summary (Last 24 hours) at 01/28/2024 1504 Last data filed at 01/27/2024 2200 Gross per 24 hour  Intake 1098.85 ml  Output --  Net 1098.85 ml   Filed Weights   01/24/24 0531 01/26/24 0529 01/28/24 0500  Weight: 65 kg 66.5 kg 66.5 kg   Examination: Physical Exam:  Constitutional: Chronically ill-appearing elderly African-American female who appears calm  and comfortable Respiratory: Diminished to auscultation bilaterally, no wheezing, rales, rhonchi or crackles. Normal respiratory effort and patient is not tachypenic. No accessory muscle use.  Unlabored breathing and not wearing a supplemental oxygen Cardiovascular: RRR, no murmurs / rubs / gallops. S1 and S2 auscultated. No extremity edema. 2+ pedal pulses. No carotid bruits.  Abdomen: Soft, non-tender, distended secondary to body habitus and does have an incisional hernia noted.  Bowel sounds positive.  GU: Deferred. Musculoskeletal: No clubbing / cyanosis of digits/nails. No joint deformity upper and lower extremities.  PICC line is in place. Skin: No rashes, lesions, ulcers on limited skin evaluation. No induration; Warm and dry.  Neurologic: CN 2-12 grossly intact with no focal deficits.  Romberg sign  and cerebellar reflexes not assessed.  Psychiatric: Has a pleasant mood and affect and she is awake and alert  Data Reviewed: I have personally reviewed following labs and imaging studies  CBC: Recent Labs  Lab 01/24/24 0314 01/25/24 0614 01/26/24 0507 01/27/24 0247 01/28/24 0228  WBC 10.0 10.9* 10.8* 9.9 11.4*  NEUTROABS 6.3 7.1 7.8* 6.9 8.4*  HGB 10.9* 11.6* 10.5* 9.9* 9.2*  HCT 33.7* 35.5* 33.8* 31.2* 29.4*  MCV 82.0 80.9 83.3 83.2 84.2  PLT 671* 677* 617* 557* 479*   Basic Metabolic Panel: Recent Labs  Lab 01/24/24 0314 01/25/24 0614 01/26/24 0507 01/27/24 0247 01/28/24 0228  NA 138 139 137 138 139  K 3.6 3.3* 4.6 4.1 3.9  CL 104 103 106 105 107  CO2 26 24 25 26 26   GLUCOSE 199* 136* 167* 130* 140*  BUN 13 16 16 23  29*  CREATININE 1.03* 1.22* 1.19* 1.04* 1.12*  CALCIUM 8.5* 8.7* 8.4* 8.4* 8.5*  MG 1.8 2.2 2.1 2.3 2.2  PHOS 2.6 3.2 3.3 4.0 3.9   GFR: Estimated Creatinine Clearance: 31.5 mL/min (A) (by C-G formula based on SCr of 1.12 mg/dL (H)). Liver Function Tests: Recent Labs  Lab 01/24/24 0314 01/25/24 0614 01/26/24 0507 01/27/24 0247 01/28/24 0228  AST 23 27 28 26 22   ALT 17 17 19 20 16   ALKPHOS 52 60 62 59 55  BILITOT 0.6 0.6 0.5 0.6 0.4  PROT 5.6* 5.8* 5.6* 5.6* 5.5*  ALBUMIN 2.3* 2.3* 2.2* 2.2* 2.3*   No results for input(s): "LIPASE", "AMYLASE" in the last 168 hours. No results for input(s): "AMMONIA" in the last 168 hours. Coagulation Profile: No results for input(s): "INR", "PROTIME" in the last 168 hours. Cardiac Enzymes: No results for input(s): "CKTOTAL", "CKMB", "CKMBINDEX", "TROPONINI" in the last 168 hours. BNP (last 3 results) No results for input(s): "PROBNP" in the last 8760 hours. HbA1C: No results for input(s): "HGBA1C" in the last 72 hours. CBG: Recent Labs  Lab 01/26/24 0830 01/26/24 1659 01/26/24 2017 01/26/24 2334 01/27/24 0425  GLUCAP 153* 138* 140* 132* 131*   Lipid Profile: Recent Labs    01/28/24 0228   TRIG 129   Thyroid Function Tests: No results for input(s): "TSH", "T4TOTAL", "FREET4", "T3FREE", "THYROIDAB" in the last 72 hours. Anemia Panel: No results for input(s): "VITAMINB12", "FOLATE", "FERRITIN", "TIBC", "IRON", "RETICCTPCT" in the last 72 hours. Sepsis Labs: No results for input(s): "PROCALCITON", "LATICACIDVEN" in the last 168 hours.  No results found for this or any previous visit (from the past 240 hours).   Radiology Studies: No results found.  Scheduled Meds:  amLODipine  5 mg Oral Daily   Chlorhexidine Gluconate Cloth  6 each Topical Daily   clotrimazole  1 Applicatorful Vaginal QHS   feeding supplement  237  mL Oral TID WC   heparin  5,000 Units Subcutaneous Q8H   latanoprost  1 drop Both Eyes QHS   levothyroxine  100 mcg Oral QAC breakfast   lidocaine  1 patch Transdermal Q24H   losartan  50 mg Oral Daily   metoprolol tartrate  7.5 mg Intravenous Q6H   pantoprazole  40 mg Oral BID   simethicone  80 mg Oral QID   sodium chloride flush  10-40 mL Intracatheter Q12H   Continuous Infusions:  TPN ADULT (ION) 70 mL/hr at 01/27/24 1740   TPN ADULT (ION)      LOS: 11 days   Marguerita Merles, DO [facility] Available via Epic secure chat 7am-7pm After these hours, please refer to coverage provider listed on [email or web address] 01/28/2024, 3:04 PM

## 2024-01-28 NOTE — Progress Notes (Addendum)
 PHARMACY - TOTAL PARENTERAL NUTRITION CONSULT NOTE   Indication: Prolonged Ileus   Patient Measurements: Height: 5\' 1"  (154.9 cm) Weight: 66.5 kg (146 lb 9.7 oz) IBW/kg (Calculated) : 47.8 TPN AdjBW (KG): 57.6 Body mass index is 27.7 kg/m. Usual weight 131-137 lbs  Assessment:  73 YOF presenting with SBO with ileocecal/colon mass, s/p hemicolectomy 3/14, prolonged ileus post-op with increased abdominal distention. Patient with chronic constipation and 3 days of N/V prior to admission. Patient lost ~10 lbs in 2 months per chart review. Pharmacy consulted for TPN.  PT reports allergy to shrimp only, has no issues with fish therefore will proceed with SMOF lipid and monitor  3/19 Progressive distention and abdominal discomfort yesterday despite NG tube. NGT appears to be malpositioned.  3/21 NGT dislodged/removed, PICC not functioning and TPN stopped ~noon on 3/20, D10 hung in place of new TPN. Spoke with Leonette Most, PA from IR and PICC replaced by IR. TPN hung at usual time without issues.  Glucose / Insulin: BG <150, off insulin. 3/14 A1c 5.7 (metformin PTA) Electrolytes:  CoCa 9.8, others wnl Renal: SCr 1.12, BUN 29 Hepatic: LFTs wnl, TG 129, alb 2.3 Intake / Output; MIVF: UOP not charted, LBM x7 3/23 GI Imaging:  3/21 CT: partial colectomy surgical changes. Supraumbilical Richter ventral hernia containing a short segment of small bowel. No definite associated SBO  3/19 KUB: Stable small bowel obstruction  3/18 KUB: Gaseous small bowel distention in the central abdomen  3/17 KUB: Gaseous small bowel distention in the left abdomen  3/13 CT: SBO with large ileocecal/ascending colon mass, concern for malignancy GI Surgeries / Procedures:  3/14 ex lap, partial colectomy 3/18 remove misplaced NGT 3/19 unable to place NGT at bedside  3/20 IR replaced NGT   Central access: PICC (double lumen) placed 3/17, replaced by IR 3/21  TPN start date: 3/18  Nutritional Goals: Goal TPN rate is 70  mL/hr, provides 85g protein, 1656 Kcals  RD Assessment:   Estimated Needs Total Energy Estimated Needs: 1500-1700 kcal Total Protein Estimated Needs: 65-85 gm Total Fluid Estimated Needs: >1.5L/day  Current Nutrition:  TPN  3/22 CLD 3/23 CLD + 1 Ensure 3/24 FLD + half an Ensure so far   Plan:  Continue TPN at goal 70 mL/hr at 1800, provides 100% of protein and kcal needs  Electrolytes in TPN: Na 100 mEq/L, increase K 42 mEq/L, Ca 2.5 mEq/L, Mg 4 mEq/L, Phos 10 mmol/L. Cl:Ac 1:1  Add standard MVI and trace elements to TPN  Monitor TPN labs Mon/Thurs and PRN   F/u PO intake and wean TPN as able   Thank you for involving pharmacy in this patient's care.  Alphia Moh, PharmD, BCPS, BCCP Clinical Pharmacist  Please check AMION for all [facility] Pharm[facility] phone numbers After 10:00 PM, call Main Pharmacy [phone]

## 2024-01-28 NOTE — TOC Progression Note (Signed)
 Transition of Care [facility]) - Progression Note    Patient Details  Name: [name] MRN: [id number] Date of Birth: [birth year]  Transition of [facility]) CM/SW Contact  Janae Bridgeman, RN Phone Number: 01/28/2024, 1:58 PM  Clinical Narrative:    CM met with the patient at the bedside to discuss TOC needs.  The patient is advancing diet today and TPN will likely be discontinued once patient is able to tolerate diet.  I spoke with the patient and daughter at the bedside and [facility] will continue to follow the patient to home health needs when she returns home.  I asked that the daughter obtain a stepstool with a arm rest for mobility into her car for appointments.  Patient's daughter was provided with DSS contact for follow up regarding possible personal care at the home since the daughter is a CNA and would like to get paid for her mother's care.  [employer] was called to deliver Rolator to the bedside.  3:1 was already present at the bedside.  CM will continue to follow the patient for TOC needs to return home when medically stable for home this week.        Expected Discharge Plan and Services                                               Social Determinants of Health (SDOH) Interventions SDOH Screenings   Food Insecurity: No Food Insecurity (01/18/2024)  Housing: Low Risk  (01/18/2024)  Transportation Needs: No Transportation Needs (01/18/2024)  Utilities: Not At Risk (01/18/2024)  Social Connections: Unknown (01/18/2024)  Tobacco Use: Low Risk  (01/18/2024)    Readmission Risk Interventions    01/28/2024    1:58 PM  Readmission Risk Prevention Plan  Transportation Screening Complete  PCP or Specialist Appt within 3-5 Days Complete  HRI or Home Care Consult Complete  Social Work Consult for Recovery Care Planning/Counseling Complete  Palliative Care Screening Complete

## 2024-01-28 NOTE — Progress Notes (Signed)
 Nutrition Follow-up  DOCUMENTATION CODES:   Not applicable  INTERVENTION:   -TPN management per pharmacy -Continue Ensure Enlive po TID, each supplement provides 350 kcal and 20 grams of protein -RD will follow for diet advancement and adjust supplement regimen as appropriate   NUTRITION DIAGNOSIS:   Inadequate oral intake related to cancer and cancer related treatments as evidenced by NPO status.  Progressing; advanced to full liquid diet on 01/28/24  GOAL:   Patient will meet greater than or equal to 90% of their needs  Progressing   MONITOR:   Diet advancement, Labs, Weight trends, I & O's  REASON FOR ASSESSMENT:   Consult New TPN/TNA  ASSESSMENT:   88 y.o female with PMH of T2DM, CKD stage 3a, HTN, HLD, hypothyroidism, CHF, anemia. Presented with abdominal pain along with nausea and vomiting. Found to have small bowel obstruction secondary to ileocecal colonic mass-suspected for malignancy.  3/13 NGT placed, CT abdomen and pelvis showed SBO with large ileocecal/ascending colon mass, concern for malignancy  3/14 -hemicolectomy with ileocolic anastomosis  3/18 - TPN initiated, pathology confirms colonic adenocarcinoma 3/21- s/p PICC line placement 3/22- advanced to clear liquid diet 3/24- advanced to full liquid diet  Reviewed I/O's: +1.1 L x 24 hours and +13.5 L since admission   Pt unavailable at time of visit. Attempted to speak with pt via call to hospital room phone, however, unable to reach.   Case discussed with RN, who reports pt is doing well and consumed some full liquids yet. She had not yet drank Ensure today. Per RN, pt currently working with physical therapy.   Pt remains on TPN- infusing at 70 ml/hr, which provides 1656 kcals and 84 grams protein, meeting 100% of needs. Per general surgery notes, plan to d/c TPN in 1-2 days.   Medications reviewed and protonix.   Labs reviewed: CBGS: 131-159 (inpatient orders for glycemic control are none).     Diet Order:   Diet Order             Diet full liquid Fluid consistency: Thin  Diet effective now                   EDUCATION NEEDS:   No education needs have been identified at this time  Skin:  Skin Assessment: Skin Integrity Issues: Skin Integrity Issues:: Incisions Incisions: Abdomen  Last BM:  01/27/24  Height:   Ht Readings from Last 1 Encounters:  01/18/24 5\' 1"  (1.549 m)    Weight:   Wt Readings from Last 1 Encounters:  01/28/24 66.5 kg    Ideal Body Weight:  47.8 kg  BMI:  Body mass index is 27.7 kg/m.  Estimated Nutritional Needs:   Kcal:  1500-1700 kcal  Protein:  65-85 gm  Fluid:  >1.5L/day    Levada Schilling, RD, LDN, CDCES Registered Dietitian III Certified Diabetes Care and Education Specialist If unable to reach this RD, please use "RD Inpatient" group chat on secure chat between hours of 8am-4 pm daily

## 2024-01-28 NOTE — Progress Notes (Addendum)
 10 Days Post-Op   Chief Complaint/Subjective: Feels much better today - had multiple BMs last 24h. Reports less belching and more flatus. Mobilizing with binder. Sipped ensure yesterday.    AFVSS Objective: Vital signs in last 24 hours: Temp:  [97.7 F (36.5 C)-99.3 F (37.4 C)] 98.7 F (37.1 C) (03/24 0557) Pulse Rate:  [67-80] 72 (03/24 0557) Resp:  [18-20] 18 (03/24 0100) BP: (135-158)/(67-107) 153/67 (03/24 0557) SpO2:  [97 %-100 %] 100 % (03/24 0557) Weight:  [66.5 kg] 66.5 kg (03/24 0500) Last BM Date : 01/27/24 Intake/Output from previous day: 03/23 0701 - 03/24 0700 In: 1098.9 [P.O.:120; I.V.:978.9] Out: -   PE: Gen: elderly black female, NAD. Resp: nonlabored Card: RRR, no lower extermity edema  Abd: soft, mild distention - significantly improved compared to prior, soft ventral incisional hernia just above incision - temporarily reduces. No cellulitis. Staples c/d/i  Lab Results:  Recent Labs    01/27/24 0247 01/28/24 0228  WBC 9.9 11.4*  HGB 9.9* 9.2*  HCT 31.2* 29.4*  PLT 557* 479*   Recent Labs    01/27/24 0247 01/28/24 0228  NA 138 139  K 4.1 3.9  CL 105 107  CO2 26 26  GLUCOSE 130* 140*  BUN 23 29*  CREATININE 1.04* 1.12*  CALCIUM 8.4* 8.5*   No results for input(s): "LABPROT", "INR" in the last 72 hours.    Component Value Date/Time   NA 139 01/28/2024 0228   K 3.9 01/28/2024 0228   CL 107 01/28/2024 0228   CO2 26 01/28/2024 0228   GLUCOSE 140 (H) 01/28/2024 0228   BUN 29 (H) 01/28/2024 0228   CREATININE 1.12 (H) 01/28/2024 0228   CREATININE 1.26 (H) 11/13/2023 1248   CALCIUM 8.5 (L) 01/28/2024 0228   PROT 5.5 (L) 01/28/2024 0228   ALBUMIN 2.3 (L) 01/28/2024 0228   AST 22 01/28/2024 0228   AST 16 11/13/2023 1248   ALT 16 01/28/2024 0228   ALT 8 11/13/2023 1248   ALKPHOS 55 01/28/2024 0228   BILITOT 0.4 01/28/2024 0228   BILITOT 0.5 11/13/2023 1248   GFRNONAA 48 (L) 01/28/2024 0228   GFRNONAA 42 (L) 11/13/2023 1248   GFRAA 48  (L) 12/02/2018 1255    Assessment/Plan  s/p Procedure(s): COLECTOMY, PARTIAL LAPAROTOMY, EXPLORATORY 01/18/2024  - path: colonic adenocarcinoma, negative margins, 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 Oncology has seen the patient, outpatient F/U Dr. Mosetta Putt.  - POD#10 - afebrile, WBC 11 from 9.9 - CT abdomen/pelvis 3/21 confirms a persistent ventral hernia - when compared to prior imaging this was present pre-operatively but now it does contain small bowel. This does not appear to be causing SBO and it is soft. Discussed risk for incarceration or SBO with family. At this time continue to monitor, abdominal binder when sitting up/ambulating, remove when laying. - post-op ileus slowly resolving, advance to FLD, continue TPN. Consider advancing to SOFT diet later today. - s/p 2 units pRBC 3/17, monitor hgb, 9.2 today. - PICC placed 3/17 but became occluded, replaced by IR 3/21  FEN - FLD, ensure TID, TPN; consider soft diet later today, consider weaning TPN starting tomorrow or Wednesday. VTE - heparin 5000 [place] ID - ceftriaxone completed for UTI  Disposition - ileus after bowel surgery, incisional hernia containing small bowel - monitor   LOS: 11 days   I reviewed last 24 h vitals and pain scores, last 48 h intake and output, last 24 h labs  and trends, and last 24 h imaging results.  This care required high  level of medical decision making.   Francine Graven [facility] Surgery at [facility] 01/28/2024, 8:21 AM Please see Amion for pager number during day hours 7:00am-4:30pm or 7:00am -11:30am on weekends

## 2024-01-28 NOTE — Progress Notes (Signed)
 Occupational Therapy Treatment Patient Details Name: [name] MRN: [id number] DOB: 04-16-1936 Today's Date: 01/28/2024   History of present illness 88 yo female admitted 01/17/24 with abdominal pain, SBO due to colon mass. 3/14 open right hemicolectomy with ileocolic anastomosis. Post-op ileus. PMHx: T2DM, CKD, HTN, hypothyroidism, iron deficiency anemia, essential thrombocytosis, glaucoma.   OT comments  Pt making steady progress towards OT goals this session. Session focus on BADL reeducation, functional ambulation and increasing activity tolerance for higher level functional mobility tasks. Pt currently requires set-up for UB ADLS and up to MAX A for LB ADLS d/t abd incision. Pts daughter present during session assisting pt as needed with LB ADLs. Pt completes functional ambulation with Rw and CGA for safety. Pt would continue to benefit from skilled occupational therapy while admitted and after d/c in [facility] to address the below listed limitations in order to improve overall functional mobility and facilitate independence with BADL participation. DC plan remains appropriate, will follow acutely per POC.         If plan is discharge home, recommend the following:  A little help with walking and/or transfers;A lot of help with bathing/dressing/bathroom;Assistance with feeding;Assist for transportation;Help with stairs or ramp for entrance   Equipment Recommendations  BSC/3in1    Recommendations for Other Services      Precautions / Restrictions Precautions Precautions: Fall;Other (comment) Recall of Precautions/Restrictions: Intact Precaution/Restrictions Comments: abdominal binder for mobility Required Braces or Orthoses: Other Brace Restrictions Weight Bearing Restrictions Per Provider Order: No       Mobility Bed Mobility               General bed mobility comments: pt in recliner    Transfers Overall transfer level: Needs assistance Equipment used: Rolling walker (2  wheels) Transfers: Sit to/from Stand Sit to Stand: Supervision           General transfer comment: supervision for sit>stands throuhgout session from recliner and BSC     Balance Overall balance assessment: Needs assistance Sitting-balance support: Feet supported, No upper extremity supported Sitting balance-Leahy Scale: Fair     Standing balance support: During functional activity, Single extremity supported Standing balance-Leahy Scale: Poor Standing balance comment: CGA for balance in standing during ADL tasks                           ADL either performed or assessed with clinical judgement   ADL Overall ADL's : Needs assistance/impaired     Grooming: Wash/dry face;Sitting;Set up Grooming Details (indicate cue type and reason): sitting on BSC at sink Upper Body Bathing: Set up;Sitting;Cueing for safety Upper Body Bathing Details (indicate cue type and reason): cues for safety to wash around incision Lower Body Bathing: Moderate assistance;Sit to/from stand Lower Body Bathing Details (indicate cue type and reason): daughter assist for LB pericare for cleanliness, daughter can assist with this task at home Upper Body Dressing : Set up;Sitting Upper Body Dressing Details (indicate cue type and reason): to don hospital gown at home Lower Body Dressing: Maximal assistance;Sit to/from stand Lower Body Dressing Details (indicate cue type and reason): to don brief from [facility] with assist from her daughter Toilet Transfer: Contact guard assist;Ambulation;Rolling walker (2 wheels)   Toileting- Clothing Manipulation and Hygiene: Contact guard assist;Sit to/from stand Toileting - Clothing Manipulation Details (indicate cue type and reason): pt able to complete pericare with only CGA but daughter assisted as needed for cleanliness     Functional mobility during ADLs: Contact  guard assist;Rolling walker (2 wheels) General ADL Comments: pt completed full ADL at sink with an  emphasis on activity tolerance and improving activity tolerance via bADL participation    Extremity/Trunk Assessment Upper Extremity Assessment Upper Extremity Assessment: Overall WFL for tasks assessed   Lower Extremity Assessment Lower Extremity Assessment: Defer to PT evaluation        Vision Baseline Vision/History: 0 No visual deficits     Perception Perception Perception: Within Functional Limits   Praxis Praxis Praxis: Impaired Praxis-Other Comments: pre morbid tremors in bilateral hands   Communication Communication Communication: Impaired Factors Affecting Communication: Reduced clarity of speech   Cognition Arousal: Alert Behavior During Therapy: WFL for tasks assessed/performed Cognition: No apparent impairments             OT - Cognition Comments: Repeated cues, however feel this is due to language barrier                 Following commands: Intact        Cueing   Cueing Techniques: Verbal cues  Exercises      Shoulder Instructions       General Comments daughter present during session assisting as needed, discussed plan for ADLs at home with recommendation on using BSC at sink for bathing d/t abd incision    Pertinent Vitals/ Pain       Pain Assessment Pain Assessment: No/denies pain  Home Living                                          Prior Functioning/Environment              Frequency  Min 1X/week        Progress Toward Goals  OT Goals(current goals can now be found in the care plan section)  Progress towards OT goals: Progressing toward goals  Acute Rehab OT Goals Patient Stated Goal: to find out when she is DC'ing OT Goal Formulation: With patient Time For Goal Achievement: 02/03/24 Potential to Achieve Goals: Good  Plan      Co-evaluation                 AM-PAC OT "6 Clicks" Daily Activity     Outcome Measure   Help from another person eating meals?: None Help from another  person taking care of personal grooming?: A Little Help from another person toileting, which includes using toliet, bedpan, or urinal?: A Little Help from another person bathing (including washing, rinsing, drying)?: A Lot Help from another person to put on and taking off regular upper body clothing?: A Little Help from another person to put on and taking off regular lower body clothing?: A Lot 6 Click Score: 17    End of Session Equipment Utilized During Treatment: Gait belt;Rolling walker (2 wheels);Other (comment) (BSC)  OT Visit Diagnosis: Unsteadiness on feet (R26.81);Muscle weakness (generalized) (M62.81)   Activity Tolerance Patient tolerated treatment well   Patient Left in chair;with call bell/phone within reach;with chair alarm set   Nurse Communication Mobility status        Time: 4098-1191 OT Time Calculation (min): 30 min  Charges: OT General Charges $OT Visit: 1 Visit OT Treatments $Self Care/Home Management : 23-37 mins  Lenor Derrick., COTA/L Acute Rehabilitation Services [phone]   Barron Schmid 01/28/2024, 2:06 PM

## 2024-01-29 DIAGNOSIS — K9189 Other postprocedural complications and disorders of digestive system: Secondary | ICD-10-CM | POA: Diagnosis not present

## 2024-01-29 DIAGNOSIS — N1831 Chronic kidney disease, stage 3a: Secondary | ICD-10-CM | POA: Diagnosis not present

## 2024-01-29 DIAGNOSIS — K56609 Unspecified intestinal obstruction, unspecified as to partial versus complete obstruction: Secondary | ICD-10-CM | POA: Diagnosis not present

## 2024-01-29 DIAGNOSIS — N179 Acute kidney failure, unspecified: Secondary | ICD-10-CM | POA: Diagnosis not present

## 2024-01-29 LAB — GLUCOSE, CAPILLARY: Glucose-Capillary: 117 mg/dL — ABNORMAL HIGH (ref 70–99)

## 2024-01-29 LAB — CBC WITH DIFFERENTIAL/PLATELET
Abs Immature Granulocytes: 0.09 10*3/uL — ABNORMAL HIGH (ref 0.00–0.07)
Basophils Absolute: 0.1 10*3/uL (ref 0.0–0.1)
Basophils Relative: 1 %
Eosinophils Absolute: 0.6 10*3/uL — ABNORMAL HIGH (ref 0.0–0.5)
Eosinophils Relative: 6 %
HCT: 29.5 % — ABNORMAL LOW (ref 36.0–46.0)
Hemoglobin: 9.3 g/dL — ABNORMAL LOW (ref 12.0–15.0)
Immature Granulocytes: 1 %
Lymphocytes Relative: 16 %
Lymphs Abs: 1.5 10*3/uL (ref 0.7–4.0)
MCH: 26.6 pg (ref 26.0–34.0)
MCHC: 31.5 g/dL (ref 30.0–36.0)
MCV: 84.5 fL (ref 80.0–100.0)
Monocytes Absolute: 0.7 10*3/uL (ref 0.1–1.0)
Monocytes Relative: 8 %
Neutro Abs: 6.4 10*3/uL (ref 1.7–7.7)
Neutrophils Relative %: 68 %
Platelets: 479 10*3/uL — ABNORMAL HIGH (ref 150–400)
RBC: 3.49 MIL/uL — ABNORMAL LOW (ref 3.87–5.11)
RDW: 19.6 % — ABNORMAL HIGH (ref 11.5–15.5)
WBC: 9.4 10*3/uL (ref 4.0–10.5)
nRBC: 0 % (ref 0.0–0.2)

## 2024-01-29 LAB — COMPREHENSIVE METABOLIC PANEL
ALT: 14 U/L (ref 0–44)
AST: 20 U/L (ref 15–41)
Albumin: 2.3 g/dL — ABNORMAL LOW (ref 3.5–5.0)
Alkaline Phosphatase: 54 U/L (ref 38–126)
Anion gap: 8 (ref 5–15)
BUN: 28 mg/dL — ABNORMAL HIGH (ref 8–23)
CO2: 26 mmol/L (ref 22–32)
Calcium: 8.7 mg/dL — ABNORMAL LOW (ref 8.9–10.3)
Chloride: 108 mmol/L (ref 98–111)
Creatinine, Ser: 1.12 mg/dL — ABNORMAL HIGH (ref 0.44–1.00)
GFR, Estimated: 48 mL/min — ABNORMAL LOW (ref >60)
Glucose, Bld: 140 mg/dL — ABNORMAL HIGH (ref 70–99)
Potassium: 4.3 mmol/L (ref 3.5–5.1)
Sodium: 142 mmol/L (ref 135–145)
Total Bilirubin: 0.4 mg/dL (ref 0.0–1.2)
Total Protein: 6 g/dL — ABNORMAL LOW (ref 6.5–8.1)

## 2024-01-29 LAB — PHOSPHORUS: Phosphorus: 4.1 mg/dL (ref 2.5–4.6)

## 2024-01-29 LAB — MAGNESIUM: Magnesium: 2 mg/dL (ref 1.7–2.4)

## 2024-01-29 MED ORDER — PROPRANOLOL HCL 10 MG PO TABS
5.0000 mg | ORAL_TABLET | Freq: Three times a day (TID) | ORAL | Status: DC
  Administered 2024-01-29 – 2024-01-30 (×3): 5 mg via ORAL
  Filled 2024-01-29 (×5): qty 0.5

## 2024-01-29 NOTE — Plan of Care (Signed)

## 2024-01-29 NOTE — Progress Notes (Signed)
 PROGRESS NOTE    [name]  ZHY:[id number] DOB: December 11, 1935 DOA: 01/17/2024 PCP: Jackie Plum, MD   Brief Narrative:  [name] is a 88 y.o. 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  She underwent surgical intervention and pathology was consistent with adenocarcinoma.    Postoperatively she was having an ileus and issues with her NG as well as her PICC line;  NG now removed and PICC line replaced by IR on 3/21.  Repeat CT Abd/Pel showed no definite small bowel obstruction  but did show a ventral hernia containing a section of bowel.  Diet is now SOFT and TPN is now being weaned.   Assessment and Plan:  SBO 2/2 to Large ileocecal/ascending colonic mass--colonic adenocarcinoma with local metastasis: Initial She is status post right hemicolectomy with ileocolic anastomosis on 01/18/2024 POD 10.  Pathology consistent with colonic adenocarcinoma and she has stage IIa colon cancer status post surgery with only high risk feature being obstruction.  Se will follow-up with Dr. Mosetta Putt at D/C to discuss the pros and cons of adjuvant Xeloda  -Now on SOFT Diet and Also receiving TPN (weaning) through her upper extremity dual-lumen power PICC; Continue to mobilize and ambulate and anticipate discharging home with home health PT/OT once cleared by Surgery  Postop Ileus: Keep electrolytes repleted potassium approximately 4, magnesium approximately 2.  Mobilize. Repeat CT Abd/Pelvis done showed no definite associated small bowel pressure but did show a ventral hernia containing small segment of small bowel.  She does not have send trace bilateral pleural effusions and some ascites as well as diffusely heterogeneous osseous mineralization and other findings.  Diet has now been advanced Soft diet today and now  Surgery weaning TPN today   Atypical Chest Pain: Cardiac enzymes negative. Continue IV PPI twice daily, Voltaren gel, simethicone. TTE done and showed LVEF of 60-65% with no RWMA and LVDP c/w G1DD and RVSF normal. C/w Supportive care as she is clinically improved.   Iron Deficiency Anemia/Acute Postop Anemia: Hgb/Hct was  slowly dropping but now stable at 9.3/29.5 Likely postop acute blood loss anemia and also likely partly dilutional as patient had been placed on IV fluids. Transfusion threshold hemoglobin < 8. Check FOBT that she is having bowel movements and given concern due to her dark tarry stools prior. Repeat CBC in the AM   Hypokalemia/ Hypomagnesemia/Hypophosphatemia: Improved. K+ 4.4 this AM, Phos 4.1 and Mg 2.0 this AM. CTM and replete as necessary as she is on TPN. Repeat labs in the AM.   Klebsiella UTI: Urinalysis moderate leukocytes, nitrite negative, rare bacteria, WBC 21-50. Urine cultures with 30,000 colonies of Klebsiella pneumonia. Completed Abx as she is S/p post 3 days IV Ceftriaxone.   Vaginal Itching: Ordered Clotrimazole 2% Vaginal Cream   AKI on CKD stage IIIa, improving: Likely secondary to prerenal azotemia as patient presented with small bowel obstruction, nausea vomiting. BUN/Cr now 23/1.04 today. Avoid Nephrotoxic Medications but will resume home Losartan as belwo, Contrast Dyes, Hypotension and Dehydration to Ensure Adequate Renal Perfusion and will need to Renally Adjust Meds; CTM and Trend Renal Function carefully and repeat CMP in the AM    Well-Controlled DMT2: HbA1c 5.7. Continue to Hold home regimen Glucophage. IVF now stopped. CBGs ranging from 131-153 on last 5 checks   Essential HTN: C/w po Amlodipine 5 mg daily and Losartan 50 mg po daily.  Resume Propanol 5 mg po TID and discontinue IV Metoprolol.  C/w IV Hydralazine 10 mg q4hprn for SBP >180. CTM BP per Protcol. Last BP Reading was 126/69    Essential Thrombocytosis: Plt peaked up to 721 and is now 479  again. Managed on Anagrelide outpt, which is currently on hold. CTM and trend and follow-up with heme-onc in outpt setting (Dr. [name]).   Hypothyroidism: C/w Levothyroxine 100 mcg Daily    HLD: Longer n.p.o. but does not appear to be on a statin from [facility] review   Splenic Masses: Indeterminate splenic masses measuring up to 2.7 cm noted on CT Abd/Pelv. Repeat CT Scan Abd/Pelvis done and showed persistent splenic lesions with the largest heterogeneous 2.8 cm stable mass. Oncology feels that the splenic masses are related to her myeloproliferative disease. Will need an outpatient dedicated abdominal MRI for further evaluation once patient is clinically stable   Hypoalbuminemia: Albumin is now 2.3. CTM and Trend and repeat CMP in the AM  Overweight: Complicates overall prognosis and care. Estimated body mass index is 27.7 kg/m as calculated from the following:   Height as of this encounter: 5\' 1"  (1.549 m).   Weight as of this encounter: 66.5 kg. Weight Loss and Dietary Counseling given   DVT prophylaxis: heparin injection 5,000 Units Start: 01/18/24 0600    Code Status: Full Code Family Communication: D/w Family present at bedside  Disposition Plan:  Level of care: Telemetry Medical Status is: Inpatient Remains inpatient appropriate because: Needs further surgical clearance as her TPN is weaning; Anticipate discharging home with home health in the next 24 to 48 hours   Consultants:  General Surgery Medical Oncology  Procedures:  As delineated as above  Antimicrobials:  Anti-infectives (From admission, onward)    Start     Dose/Rate Route Frequency Ordered Stop   01/18/24 0915  cefoTEtan (CEFOTAN) 2 g in sodium chloride 0.9 % 100 mL IVPB        2 g 200 mL/hr over 30 Minutes Intravenous On call to O.R. 01/18/24 9629 01/18/24 2042   01/18/24 0915  cefTRIAXone (ROCEPHIN) 2 g in sodium chloride 0.9 % 100 mL IVPB        2 g 200 mL/hr over 30 Minutes Intravenous Every 24 hours  01/18/24 0819 01/20/24 1011       Subjective: Seen and examined at bedside and she is doing fairly well and tolerating her diet.  Having bowel movements.  No issues and ambulating with therapy.  TPN is now going to start weaning.  No nausea or vomiting.  Objective: Vitals:   01/29/24 0833 01/29/24 1226 01/29/24 1255 01/29/24 1512  BP: (!) 165/94 (!) 160/79 (!) 148/82 126/69  Pulse: 70 67 (!) 57 71  Resp: 18 18  16   Temp: 98.4 F (36.9 C)   97.9 F (36.6 C)  TempSrc: Oral   Oral  SpO2: 98%   97%  Weight:      Height:        Intake/Output Summary (Last 24 hours) at 01/29/2024 1552 Last data filed at 01/29/2024 0600 Gross per 24 hour  Intake 877.15 ml  Output 425 ml  Net 452.15 ml   Filed Weights   01/26/24 0529 01/28/24 0500 01/29/24 0509  Weight: 66.5 kg 66.5 kg 66.4 kg   Examination: Physical Exam:  Constitutional: Chronically ill-appearing elderly African-American female who is improved and appears calm and comfortable Respiratory: Diminished to auscultation bilaterally, no wheezing, rales, rhonchi or crackles. Normal respiratory effort and patient is not  tachypenic. No accessory muscle use.  Unlabored breathing and not wearing supplemental oxygen via nasal cannula Cardiovascular: RRR, no murmurs / rubs / gallops. S1 and S2 auscultated. No extremity edema..  Abdomen: Soft, slightly-tender, distended secondary body habitus.  Incisional hernia noted. Bowel sounds positive.  GU: Deferred. Musculoskeletal: No clubbing / cyanosis of digits/nails. No joint deformity upper and lower extremities.  Skin: No rashes, lesions, ulcers. No induration; Warm and dry.  Neurologic: CN 2-12 grossly intact with no focal deficits. Romberg sign and cerebellar reflexes not assessed.  Psychiatric: Normal judgment and insight. Alert and oriented x 3.  Pleasant mood and affect  Data Reviewed: I have personally reviewed following labs and imaging studies  CBC: Recent Labs  Lab 01/25/24 0614  01/26/24 0507 01/27/24 0247 01/28/24 0228 01/29/24 0418  WBC 10.9* 10.8* 9.9 11.4* 9.4  NEUTROABS 7.1 7.8* 6.9 8.4* 6.4  HGB 11.6* 10.5* 9.9* 9.2* 9.3*  HCT 35.5* 33.8* 31.2* 29.4* 29.5*  MCV 80.9 83.3 83.2 84.2 84.5  PLT 677* 617* 557* 479* 479*   Basic Metabolic Panel: Recent Labs  Lab 01/25/24 0614 01/26/24 0507 01/27/24 0247 01/28/24 0228 01/29/24 0418  NA 139 137 138 139 142  K 3.3* 4.6 4.1 3.9 4.3  CL 103 106 105 107 108  CO2 24 25 26 26 26   GLUCOSE 136* 167* 130* 140* 140*  BUN 16 16 23  29* 28*  CREATININE 1.22* 1.19* 1.04* 1.12* 1.12*  CALCIUM 8.7* 8.4* 8.4* 8.5* 8.7*  MG 2.2 2.1 2.3 2.2 2.0  PHOS 3.2 3.3 4.0 3.9 4.1   GFR: Estimated Creatinine Clearance: 31.4 mL/min (A) (by C-G formula based on SCr of 1.12 mg/dL (H)). Liver Function Tests: Recent Labs  Lab 01/25/24 0614 01/26/24 0507 01/27/24 0247 01/28/24 0228 01/29/24 0418  AST 27 28 26 22 20   ALT 17 19 20 16 14   ALKPHOS 60 62 59 55 54  BILITOT 0.6 0.5 0.6 0.4 0.4  PROT 5.8* 5.6* 5.6* 5.5* 6.0*  ALBUMIN 2.3* 2.2* 2.2* 2.3* 2.3*   No results for input(s): "LIPASE", "AMYLASE" in the last 168 hours. No results for input(s): "AMMONIA" in the last 168 hours. Coagulation Profile: No results for input(s): "INR", "PROTIME" in the last 168 hours. Cardiac Enzymes: No results for input(s): "CKTOTAL", "CKMB", "CKMBINDEX", "TROPONINI" in the last 168 hours. BNP (last 3 results) No results for input(s): "PROBNP" in the last 8760 hours. HbA1C: No results for input(s): "HGBA1C" in the last 72 hours. CBG: Recent Labs  Lab 01/26/24 1659 01/26/24 2017 01/26/24 2334 01/27/24 0425 01/29/24 1513  GLUCAP 138* 140* 132* 131* 117*   Lipid Profile: Recent Labs    01/28/24 0228  TRIG 129   Thyroid Function Tests: No results for input(s): "TSH", "T4TOTAL", "FREET4", "T3FREE", "THYROIDAB" in the last 72 hours. Anemia Panel: No results for input(s): "VITAMINB12", "FOLATE", "FERRITIN", "TIBC", "IRON",  "RETICCTPCT" in the last 72 hours. Sepsis Labs: No results for input(s): "PROCALCITON", "LATICACIDVEN" in the last 168 hours.  No results found for this or any previous visit (from the past 240 hours).   Radiology Studies: No results found.  Scheduled Meds:  amLODipine  5 mg Oral Daily   Chlorhexidine Gluconate Cloth  6 each Topical Daily   feeding supplement  237 mL Oral TID WC   heparin  5,000 Units Subcutaneous Q8H   latanoprost  1 drop Both Eyes QHS   levothyroxine  100 mcg Oral QAC breakfast   lidocaine  1 patch Transdermal Q24H   losartan  50 mg Oral  Daily   pantoprazole  40 mg Oral BID   propranolol  5 mg Oral TID   simethicone  80 mg Oral QID   sodium chloride flush  10-40 mL Intracatheter Q12H   Continuous Infusions:  TPN ADULT (ION) 35 mL/hr at 01/29/24 1451    LOS: 12 days   Marguerita Merles, DO [facility] Available via Epic secure chat 7am-7pm After these hours, please refer to coverage provider listed on [email or web address] 01/29/2024, 3:52 PM

## 2024-01-29 NOTE — Progress Notes (Signed)
 PHARMACY - TOTAL PARENTERAL NUTRITION CONSULT NOTE   Indication: Prolonged Ileus   Patient Measurements: Height: 5\' 1"  (154.9 cm) Weight: 66.4 kg (146 lb 6.2 oz) IBW/kg (Calculated) : 47.8 TPN AdjBW (KG): 57.6 Body mass index is 27.66 kg/m. Usual weight 131-137 lbs  Assessment:  44 YOF presenting with SBO with ileocecal/colon mass, s/p hemicolectomy 3/14, prolonged ileus post-op with increased abdominal distention. Patient with chronic constipation and 3 days of N/V prior to admission. Patient lost ~10 lbs in 2 months per chart review. Pharmacy consulted for TPN.  PT reports allergy to shrimp only, has no issues with fish therefore will proceed with SMOF lipid and monitor  3/19 Progressive distention and abdominal discomfort yesterday despite NG tube. NGT appears to be malpositioned.  3/21 NGT dislodged/removed, PICC not functioning and TPN stopped ~noon on 3/20, D10 hung in place of new TPN. Spoke with Leonette Most, PA from IR and PICC replaced by IR. TPN hung at usual time without issues.  Glucose / Insulin: BG <150, off insulin. 3/14 A1c 5.7 (metformin PTA) Electrolytes:  CoCa 10.1, others wnl Renal: SCr 1.12, BUN 28 Hepatic: LFTs wnl, TG 129, alb 2.3 Intake / Output; MIVF: UOP 0.3 ml.kg/hr, LBM x7 3/23 GI Imaging:  3/21 CT: partial colectomy surgical changes. Supraumbilical Richter ventral hernia containing a short segment of small bowel. No definite associated SBO  3/19 KUB: Stable small bowel obstruction  3/18 KUB: Gaseous small bowel distention in the central abdomen  3/17 KUB: Gaseous small bowel distention in the left abdomen  3/13 CT: SBO with large ileocecal/ascending colon mass, concern for malignancy GI Surgeries / Procedures:  3/14 ex lap, partial colectomy 3/18 remove misplaced NGT 3/19 unable to place NGT at bedside  3/20 IR replaced NGT   Central access: PICC (double lumen) placed 3/17, replaced by IR 3/21  TPN start date: 3/18  Nutritional Goals: Goal TPN rate is  70 mL/hr, provides 85g protein, 1656 Kcals  RD Assessment:   Estimated Needs Total Energy Estimated Needs: 1500-1700 kcal Total Protein Estimated Needs: 65-85 gm Total Fluid Estimated Needs: >1.5L/day  Current Nutrition:  TPN  3/22 CLD 3/23 CLD + 1 Ensure 3/24 FLD + 2 Ensure > soft diet, ~25-30% meals eaten 3/25 Soft - 50% breakfast, eats small meals at home - wean TPN  Plan:  Discussed with Surgery and TRH - ok to wean TPN  Thank you for involving pharmacy in this patient's care.  Loura Back, PharmD, BCPS Clinical Pharmacist Clinical phone for 01/29/2024 is [phone] 01/29/2024 7:02 AM

## 2024-01-29 NOTE — Progress Notes (Signed)
 Mobility Specialist Progress Note:   01/29/24 1400  Mobility  Activity Ambulated with assistance in hallway  Level of Assistance Contact guard assist, steadying assist  Assistive Device Front wheel walker  Distance Ambulated (ft) 300 ft  Activity Response Tolerated well  Mobility Referral Yes  Mobility visit 1 Mobility  Mobility Specialist Start Time (ACUTE ONLY) 1350  Mobility Specialist Stop Time (ACUTE ONLY) 1402  Mobility Specialist Time Calculation (min) (ACUTE ONLY) 12 min   Received pt in chair having no complaints and agreeable to mobility. Pt was asymptomatic throughout ambulation. Wore abdominal binder throughout session. Returned to room w/o fault. Left in chair w/ call bell in reach and all needs met.   Thompson Grayer Mobility Specialist  Please contact vis Secure Chat or  Rehab Office [phone]

## 2024-01-29 NOTE — Progress Notes (Signed)
 11 Days Post-Op   Chief Complaint/Subjective: No new complaints. Tolerating soft diet without n/v/worsening abdominal pain. Had a bm this am. She is not finishing her meals but she and daughter state that intake here is similar to intake at home at baseline. She is drinking ensures Daughter at bedside AFVSS Objective: Vital signs in last 24 hours: Temp:  [97.9 F (36.6 C)-98.9 F (37.2 C)] 98.4 F (36.9 C) (03/25 0833) Pulse Rate:  [59-84] 70 (03/25 0833) Resp:  [17-19] 18 (03/25 0833) BP: (131-165)/(68-94) 165/94 (03/25 0833) SpO2:  [95 %-99 %] 98 % (03/25 0833) Weight:  [66.4 kg] 66.4 kg (03/25 0509) Last BM Date : 01/28/24 Intake/Output from previous day: 03/24 0701 - 03/25 0700 In: 877.2 [I.V.:877.2] Out: 425 [Urine:425]  PE: Gen: elderly black female, NAD. Resp: nonlabored Card: RRR, no lower extermity edema  Abd: soft, mild distention -  soft ventral incisional hernia just above incision - temporarily reduces. No cellulitis. Staples c/d/i  Lab Results:  Recent Labs    01/28/24 0228 01/29/24 0418  WBC 11.4* 9.4  HGB 9.2* 9.3*  HCT 29.4* 29.5*  PLT 479* 479*   Recent Labs    01/28/24 0228 01/29/24 0418  NA 139 142  K 3.9 4.3  CL 107 108  CO2 26 26  GLUCOSE 140* 140*  BUN 29* 28*  CREATININE 1.12* 1.12*  CALCIUM 8.5* 8.7*   No results for input(s): "LABPROT", "INR" in the last 72 hours.    Component Value Date/Time   NA 142 01/29/2024 0418   K 4.3 01/29/2024 0418   CL 108 01/29/2024 0418   CO2 26 01/29/2024 0418   GLUCOSE 140 (H) 01/29/2024 0418   BUN 28 (H) 01/29/2024 0418   CREATININE 1.12 (H) 01/29/2024 0418   CREATININE 1.26 (H) 11/13/2023 1248   CALCIUM 8.7 (L) 01/29/2024 0418   PROT 6.0 (L) 01/29/2024 0418   ALBUMIN 2.3 (L) 01/29/2024 0418   AST 20 01/29/2024 0418   AST 16 11/13/2023 1248   ALT 14 01/29/2024 0418   ALT 8 11/13/2023 1248   ALKPHOS 54 01/29/2024 0418   BILITOT 0.4 01/29/2024 0418   BILITOT 0.5 11/13/2023 1248    GFRNONAA 48 (L) 01/29/2024 0418   GFRNONAA 42 (L) 11/13/2023 1248   GFRAA 48 (L) 12/02/2018 1255    Assessment/Plan  s/p Procedure(s): COLECTOMY, PARTIAL LAPAROTOMY, EXPLORATORY 01/18/2024  - path: colonic adenocarcinoma, negative margins, 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 Oncology has seen the patient, outpatient F/U Dr. Mosetta Putt.  - POD#11 - afebrile, WBC normal - CT abdomen/pelvis 3/21 confirms a persistent ventral hernia - when compared to prior imaging this was present pre-operatively but now it does contain small bowel. This does not appear to be causing SBO and it is soft. Discussed risk for incarceration or SBO with family. At this time continue to monitor, abdominal binder when sitting up/ambulating, remove when laying. - post-op ileus slowly resolving, tolerating soft diet with baseline appetite - can wean TPN today - s/p 2 units pRBC 3/17, monitor hgb - stable. - PICC placed 3/17 but became occluded, replaced by IR 3/21  FEN - soft, ensure TID, TPN - wean today VTE - heparin 5000 [place] ID - ceftriaxone completed for UTI  Disposition - ileus after bowel surgery, incisional hernia containing small bowel - monitor. Eventually home with [facility]   LOS: 12 days   I reviewed last 24 h vitals and pain scores, last 48 h intake  and output, last 24 h labs and trends, and last 24 h imaging results.  This care required high  level of medical decision making.   Francena Hanly [facility] Surgery at [facility] 01/29/2024, 10:54 AM Please see Amion for pager number during day hours 7:00am-4:30pm or 7:00am -11:30am on weekends

## 2024-01-30 DIAGNOSIS — K56609 Unspecified intestinal obstruction, unspecified as to partial versus complete obstruction: Secondary | ICD-10-CM | POA: Diagnosis not present

## 2024-01-30 MED ORDER — POLYETHYLENE GLYCOL 3350 17 G PO PACK: 17.0000 g | PACK | Freq: Every day | ORAL | Status: AC | PRN

## 2024-01-30 MED ORDER — DOCUSATE SODIUM 100 MG PO CAPS: 100.0000 mg | ORAL_CAPSULE | Freq: Two times a day (BID) | ORAL | Status: AC

## 2024-01-30 MED ORDER — OXYCODONE HCL 5 MG PO TABS: 2.5000 mg | ORAL_TABLET | Freq: Four times a day (QID) | ORAL | 0 refills | Status: AC | PRN

## 2024-01-30 NOTE — Progress Notes (Signed)
 Occupational Therapy Treatment Patient Details Name: [name] MRN: [id number] DOB: 03/23/1936 Today's Date: 01/30/2024   History of present illness 88 yo female admitted 01/17/24 with abdominal pain, SBO due to colon mass. 3/14 open right hemicolectomy with ileocolic anastomosis. Post-op ileus. PMHx: T2DM, CKD, HTN, hypothyroidism, iron deficiency anemia, essential thrombocytosis, glaucoma.   OT comments  Pt progressing toward established OT goals. Challenging balance, activity tolerance and return to functional Adl this session. Introduced sock aid as pt needing max A last session and able to change socks this session mod A noting decr BUE dexterity and strength during task performance. Using restroom with CGA overall, but up to mod A for threading second LE into pants. Will continue to follow. Education and names of equipment provided to daughter present.       If plan is discharge home, recommend the following:  A little help with walking and/or transfers;A lot of help with bathing/dressing/bathroom;Assistance with feeding;Assist for transportation;Help with stairs or ramp for entrance   Equipment Recommendations  BSC/3in1    Recommendations for Other Services      Precautions / Restrictions Precautions Precautions: Fall;Other (comment) Recall of Precautions/Restrictions: Intact Precaution/Restrictions Comments: abdominal binder for mobility Required Braces or Orthoses: Other Brace       Mobility Bed Mobility Overal bed mobility: Needs Assistance Bed Mobility: Supine to Sit     Supine to sit: Contact guard, HOB elevated, Used rails          Transfers Overall transfer level: Needs assistance Equipment used: Rolling walker (2 wheels) Transfers: Sit to/from Stand Sit to Stand: Supervision           General transfer comment: for STS from EOB and BSC. daughter observed to elevate bed surface prior to pt standing     Balance Overall balance assessment: Needs  assistance Sitting-balance support: Feet supported, No upper extremity supported Sitting balance-Leahy Scale: Fair Sitting balance - Comments: static sitting EOB   Standing balance support: During functional activity, Single extremity supported Standing balance-Leahy Scale: Poor Standing balance comment: CGA for balance in standing during ADL tasks                           ADL either performed or assessed with clinical judgement   ADL Overall ADL's : Needs assistance/impaired     Grooming: Wash/dry hands;Contact guard assist;Standing Grooming Details (indicate cue type and reason): cues to use soap             Lower Body Dressing: Moderate assistance;Sit to/from stand;With adaptive equipment   Toilet Transfer: Contact guard assist;Ambulation;Rolling walker (2 wheels) Toilet Transfer Details (indicate cue type and reason): for safety. 1-2 cues for RW management in smaller space. Toileting- Clothing Manipulation and Hygiene: Contact guard assist;Sit to/from stand       Functional mobility during ADLs: Contact guard assist;Rolling walker (2 wheels)      Extremity/Trunk Assessment Upper Extremity Assessment Upper Extremity Assessment: Generalized weakness (noted decr dexterity during sock aid use)   Lower Extremity Assessment Lower Extremity Assessment: Defer to PT evaluation        Vision   Vision Assessment?: No apparent visual deficits   Perception     Praxis Praxis Praxis: Impaired Praxis-Other Comments: pre-morbid tremor bil hands   Communication Communication Communication: Impaired Factors Affecting Communication: Reduced clarity of speech   Cognition Arousal: Alert Behavior During Therapy: WFL for tasks assessed/performed Cognition: No apparent impairments  OT - Cognition Comments: Repeated cues, however feel this is due to language barrier                 Following commands: Intact        Cueing   Cueing  Techniques: Verbal cues  Exercises      Shoulder Instructions       General Comments daughter present and supportive    Pertinent Vitals/ Pain       Pain Assessment Pain Assessment: No/denies pain Pain Location: abdomen Pain Descriptors / Indicators: Discomfort, Aching Pain Intervention(s): Limited activity within patient's tolerance  Home Living                                          Prior Functioning/Environment              Frequency  Min 1X/week        Progress Toward Goals  OT Goals(current goals can now be found in the care plan section)  Progress towards OT goals: Progressing toward goals  Acute Rehab OT Goals Patient Stated Goal: change underpants OT Goal Formulation: With patient Time For Goal Achievement: 02/03/24 Potential to Achieve Goals: Good ADL Goals Pt Will Perform Grooming: with supervision;standing Pt Will Perform Lower Body Dressing: with supervision;sit to/from stand Pt Will Transfer to Toilet: with supervision;ambulating Pt Will Perform Tub/Shower Transfer: Tub transfer;with contact guard assist  Plan      Co-evaluation                 AM-PAC OT "6 Clicks" Daily Activity     Outcome Measure   Help from another person eating meals?: None Help from another person taking care of personal grooming?: A Little Help from another person toileting, which includes using toliet, bedpan, or urinal?: A Little Help from another person bathing (including washing, rinsing, drying)?: A Lot Help from another person to put on and taking off regular upper body clothing?: A Little Help from another person to put on and taking off regular lower body clothing?: A Lot 6 Click Score: 17    End of Session Equipment Utilized During Treatment: Gait belt;Rolling walker (2 wheels)  OT Visit Diagnosis: Unsteadiness on feet (R26.81);Muscle weakness (generalized) (M62.81)   Activity Tolerance Patient tolerated treatment well    Patient Left in bed;with bed alarm set;with call bell/phone within reach;with family/visitor present   Nurse Communication Mobility status        Time: 8295-6213 OT Time Calculation (min): 23 min  Charges: OT General Charges $OT Visit: 1 Visit OT Treatments $Self Care/Home Management : 23-37 mins  Tyler Deis, OTR/L [facility] Acute Rehabilitation Office: [phone]   Myrla Halsted 01/30/2024, 9:55 AM

## 2024-01-30 NOTE — Discharge Instructions (Signed)
 [facility]      [facility], [place] [phone]  OPEN ABDOMINAL SURGERY: POST OP INSTRUCTIONS  Always review your discharge instruction sheet given to you by the facility where your surgery was performed.  IF YOU HAVE DISABILITY OR FAMILY LEAVE FORMS, YOU MUST BRING THEM TO THE OFFICE FOR PROCESSING.  PLEASE DO NOT GIVE THEM TO YOUR DOCTOR.  A prescription for pain medication may be given to you upon discharge.  Take your pain medication as prescribed, if needed.  If narcotic pain medicine is not needed, then you may take acetaminophen (Tylenol) or ibuprofen (Advil) as needed. Take your usually prescribed medications unless otherwise directed. If you need a refill on your pain medication, please contact your pharmacy. They will contact our office to request authorization.  Prescriptions will not be filled after 5pm or on week-ends. You should follow a light diet the first few days after arrival home, such as soup and crackers, pudding, etc.unless your doctor has advised otherwise. A high-fiber, low fat diet can be resumed as tolerated.   Be sure to include lots of fluids daily. Most patients will experience some swelling and bruising on the chest and neck area.  Ice packs will help.  Swelling and bruising can take several days to resolve Most patients will experience some swelling and bruising in the area of the incision. Ice pack will help. Swelling and bruising can take several days to resolve..  It is common to experience some constipation if taking pain medication after surgery.  Increasing fluid intake and taking a stool softener will usually help or prevent this problem from occurring.  A mild laxative (Milk of Magnesia or Miralax) should be taken according to package directions if there are no bowel movements after 48 hours.  You may have steri-strips (small skin tapes) in place directly over the incision.  These strips should be left on the skin for 7-10 days.  If your surgeon used skin  glue on the incision, you may shower in 24 hours.  The glue will flake off over the next 2-3 weeks.  Any sutures or staples will be removed at the office during your follow-up visit. You may find that a light gauze bandage over your incision may keep your staples from being rubbed or pulled. You may shower and replace the bandage daily. ACTIVITIES:  You may resume regular (light) daily activities beginning the next day--such as daily self-care, walking, climbing stairs--gradually increasing activities as tolerated.  You may have sexual intercourse when it is comfortable.  Refrain from any heavy lifting or straining until approved by your doctor. You may drive when you no longer are taking prescription pain medication, you can comfortably wear a seatbelt, and you can safely maneuver your car and apply brakes Return to Work: ___________________________________ Bonita Quin should see your doctor in the office for a follow-up appointment approximately two weeks after your surgery.  Make sure that you call for this appointment within a day or two after you arrive home to insure a convenient appointment time. OTHER INSTRUCTIONS:  _____________________________________________________________ _____________________________________________________________  WHEN TO CALL YOUR DOCTOR: Fever over 101.0 Inability to urinate Nausea and/or vomiting Extreme swelling or bruising Continued bleeding from incision. Increased pain, redness, or drainage from the incision. Difficulty swallowing or breathing Muscle cramping or spasms. Numbness or tingling in hands or feet or around lips.  The clinic staff is available to answer your questions during regular business hours.  Please don't hesitate to call and ask to speak to one of  the nurses if you have concerns.  For further questions, please visit [email or web address]

## 2024-01-30 NOTE — Plan of Care (Signed)

## 2024-01-30 NOTE — Progress Notes (Signed)
 12 Days Post-Op   Chief Complaint/Subjective: Doing well this am and eager to go home when able. Tolerating diet without n/v/abdominal pain. TPN off. Last bm yesterday  Daughter at bedside  Objective: Vital signs in last 24 hours: Temp:  [97.9 F (36.6 C)-98.6 F (37 C)] 98.1 F (36.7 C) (03/26 0414) Pulse Rate:  [57-74] 66 (03/26 0414) Resp:  [16-18] 18 (03/26 0414) BP: (126-160)/(69-82) 138/76 (03/26 0414) SpO2:  [97 %-100 %] 99 % (03/26 0414) Weight:  [68.4 kg] 68.4 kg (03/26 0451) Last BM Date : 01/28/24 Intake/Output from previous day: 03/25 0701 - 03/26 0700 In: 90 [P.O.:90] Out: 600 [Urine:600]  PE: Gen: elderly black female, NAD. Resp: nonlabored on room air Abd: soft, mild distention -  soft ventral incisional hernia just above incision - temporarily reduces. No cellulitis. Staples c/d/i  Lab Results:  Recent Labs    01/28/24 0228 01/29/24 0418  WBC 11.4* 9.4  HGB 9.2* 9.3*  HCT 29.4* 29.5*  PLT 479* 479*   Recent Labs    01/28/24 0228 01/29/24 0418  NA 139 142  K 3.9 4.3  CL 107 108  CO2 26 26  GLUCOSE 140* 140*  BUN 29* 28*  CREATININE 1.12* 1.12*  CALCIUM 8.5* 8.7*   No results for input(s): "LABPROT", "INR" in the last 72 hours.    Component Value Date/Time   NA 142 01/29/2024 0418   K 4.3 01/29/2024 0418   CL 108 01/29/2024 0418   CO2 26 01/29/2024 0418   GLUCOSE 140 (H) 01/29/2024 0418   BUN 28 (H) 01/29/2024 0418   CREATININE 1.12 (H) 01/29/2024 0418   CREATININE 1.26 (H) 11/13/2023 1248   CALCIUM 8.7 (L) 01/29/2024 0418   PROT 6.0 (L) 01/29/2024 0418   ALBUMIN 2.3 (L) 01/29/2024 0418   AST 20 01/29/2024 0418   AST 16 11/13/2023 1248   ALT 14 01/29/2024 0418   ALT 8 11/13/2023 1248   ALKPHOS 54 01/29/2024 0418   BILITOT 0.4 01/29/2024 0418   BILITOT 0.5 11/13/2023 1248   GFRNONAA 48 (L) 01/29/2024 0418   GFRNONAA 42 (L) 11/13/2023 1248   GFRAA 48 (L) 12/02/2018 1255    Assessment/Plan  s/p Procedure(s): COLECTOMY,  PARTIAL LAPAROTOMY, EXPLORATORY 01/18/2024  - path: colonic adenocarcinoma, negative margins, 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 Oncology has seen the patient, outpatient F/U Dr. Mosetta Putt.  - POD#12 - CT abdomen/pelvis 3/21 confirms a persistent ventral hernia - when compared to prior imaging this was present pre-operatively but now it does contain small bowel. This does not appear to be causing SBO and it is soft. Discussed risk for incarceration or SBO with family. At this time continue to monitor, abdominal binder when sitting up/ambulating, remove when laying. - post-op ileus resolved, tolerating soft diet with baseline appetite - TPN stopped - s/p 2 units pRBC 3/17, monitor hgb - stable. - PICC placed 3/17 but became occluded, replaced by IR 3/21 - remove staples  Stable for dc from surgical standpoint. I will arrange follow up and send pain medication  FEN - soft, ensure TID, TPN stopped VTE - heparin 5000 [place] ID - ceftriaxone completed for UTI  Disposition - ileus after bowel surgery - resolved. incisional hernia containing small bowel - monitor. Stable for dc home with HH from surgical standpoint   LOS: 13 days   I reviewed last 24 h vitals and pain scores, last 48 h intake and output, last 24 h labs  and trends, and last 24 h imaging results.  This care required high  level of medical decision making.   Francena Hanly [facility] Surgery at [facility] 01/30/2024, 9:21 AM Please see Amion for pager number during day hours 7:00am-4:30pm or 7:00am -11:30am on weekends

## 2024-01-30 NOTE — Discharge Summary (Signed)
 Physician Discharge Summary  [name] ZOX:[id number] DOB: 02/07/1936 DOA: 01/17/2024  PCP: Amanda Plum, MD  Admit date: 01/17/2024 Discharge date: 01/30/2024 30 Day Unplanned Readmission Risk Score    Flowsheet Row ED to Hosp-Admission (Current) from 01/17/2024 in [place] [facility] Unit  30 Day Unplanned Readmission Risk Score (%) 23.07 Filed at 01/30/2024 0801       This score is the patient's risk of an unplanned readmission within 30 days of being discharged (0 -100%). The score is based on dignosis, age, lab data, medications, orders, and past utilization.   Low:  0-14.9   Medium: 15-21.9   High: 22-29.9   Extreme: 30 and above          Admitted From: Home Disposition: Home  Recommendations for Outpatient Follow-up:  Follow up with PCP in 1-2 weeks Please obtain BMP/CBC in one week Follow-up with general surgery in 1 to 2 weeks, they will arrange Please follow up with your PCP on the following pending results: Unresulted Labs (From admission, onward)     Start     Ordered   Unscheduled  Occult blood card to lab, stool  As needed,   R      01/23/24 1852   Unscheduled  Occult blood card to lab, stool  As needed,   R      01/27/24 1939              Home Health: Yes Equipment/Devices: None  Discharge Condition: Stable will CODE STATUS: Full code Diet recommendation: Cardiac  Subjective: Seen and examined.  No complaints, no pain, no nausea.  Tolerating diet and wants to go home.  Daughter at the bedside also in agreement with her.  Brief/Interim Summary: [name] is a 88 y.o. female with medical history significant for T2DM, CKD stage IIIa, HTN, HLD, hypothyroidism, iron deficiency anemia, essential thrombocytosis, glaucoma who was admitted with SBO due to large ileocecal/ascending colon mass, suspected malignancy.  She underwent surgical intervention and pathology was consistent with adenocarcinoma.     Postoperatively she was having an ileus  and issues with her NG as well as her PICC line;  NG now removed and PICC line replaced by IR on 3/21.  Repeat CT Abd/Pel showed no definite small bowel obstruction  but did show a ventral hernia containing a section of bowel.  Details of the hospitalization as below.   SBO 2/2 to Large ileocecal/ascending colonic mass--colonic adenocarcinoma with local metastasis: Initial She is status post right hemicolectomy with ileocolic anastomosis on 01/18/2024 POD 10.  Pathology consistent with colonic adenocarcinoma and she has stage IIa colon cancer status post surgery with only high risk feature being obstruction.  Se will follow-up with Dr. Tania Ewing. [name] at D/C to discuss the pros and cons of adjuvant Xeloda.  She is tolerating soft diet, seen and cleared by general surgery for discharge, her staples will be removed prior to discharge and general surgery will arrange follow-up for her.   Postop Ileus:  Repeat CT Abd/Pelvis done showed no definite associated small bowel pressure but did show a ventral hernia containing small segment of small bowel.  She does not have send trace bilateral pleural effusions and some ascites as well as diffusely heterogeneous osseous mineralization and other findings.  Tolerating soft diet with bowel movements.  Ileus resolved.  Cleared by general surgery for discharge.   Atypical Chest Pain: Cardiac enzymes negative.  TTE done and showed LVEF of 60-65% with no RWMA and LVDP c/w G1DD and  RVSF normal.    Iron Deficiency Anemia/Acute Postop Anemia: Hgb/Hct was  slowly dropping but now stable at 9.3/29.5 Likely postop acute blood loss anemia and also likely partly dilutional as patient had been placed on IV fluids.  Repeat CBC in 1 week at PCPs office.   Hypokalemia/ Hypomagnesemia/Hypophosphatemia: Improved.   Klebsiella UTI: Urinalysis moderate leukocytes, nitrite negative, rare bacteria, WBC 21-50. Urine cultures with 30,000 colonies of Klebsiella pneumonia. Completed Abx as she  is S/p post 3 days IV Ceftriaxone.    Vaginal Itching: Ordered Clotrimazole 2% Vaginal Cream   AKI on CKD stage IIIa, improving: Baseline creatinine 1-1.1, peaked at 1.7 and now back to baseline, treated with hydration.  Likely secondary to prerenal azotemia.   Well-Controlled DMT2: HbA1c 5.7.  Resume home meds.  She was managed with SSI here.   Essential HTN: C/w po Amlodipine 5 mg daily and Losartan 50 mg po daily. Resume Propanol 5 mg po TID and discontinue IV Metoprolol.  C/w IV Hydralazine 10 mg q4hprn for SBP >180. CTM BP per Protcol. Last BP Reading was 126/69    Essential Thrombocytosis: Resume PTA medications.   Hypothyroidism: C/w Levothyroxine 100 mcg Daily    HLD: Resume PTA medications.   Splenic Masses: Indeterminate splenic masses measuring up to 2.7 cm noted on CT Abd/Pelv. Repeat CT Scan Abd/Pelvis done and showed persistent splenic lesions with the largest heterogeneous 2.8 cm stable mass. Oncology feels that the splenic masses are related to her myeloproliferative disease. Will need an outpatient dedicated abdominal MRI for further evaluation once patient is clinically stable    Overweight: Complicates overall prognosis and care. Estimated body mass index is 27.7 kg/m as calculated from the following:   Height as of this encounter: 5\' 1"  (1.549 m).   Weight as of this encounter: 66.5 kg. Weight Loss and Dietary Counseling given   Discharge plan was discussed with patient and/or family member and they verbalized understanding and agreed with it.  Discharge Diagnoses:  Principal Problem:   Small bowel obstruction (HCC) Active Problems:   Postoperative ileus (HCC)   Hypothyroidism   Essential hypertension, benign   Essential thrombocytosis (HCC)   Dyslipidemia   Chest pain   Iron deficiency anemia due to chronic blood loss   Colonic mass   Chronic kidney disease, stage 3a (HCC)   Urinary tract infection without hematuria   Hypomagnesemia   AKI (acute kidney  injury) (HCC)    Discharge Instructions   Allergies as of 01/30/2024       Reactions   Penicillin G Hives   Shellfish Allergy Hives        Medication List     STOP taking these medications    meloxicam 7.5 MG tablet Commonly known as: MOBIC       TAKE these medications    acetaminophen 325 MG tablet Commonly known as: TYLENOL Take 325 mg by mouth as needed for moderate pain (pain score 4-6), headache or mild pain (pain score 1-3).   amLODipine 5 MG tablet Commonly known as: NORVASC Take 5 mg by mouth at bedtime.   anagrelide 0.5 MG capsule Commonly known as: AGRYLIN Take 2 capsules (1 mg) by mouth 2 times daily.   cyanocobalamin 1000 MCG tablet Commonly known as: VITAMIN B12 Take 1,000 mcg by mouth daily.   docusate sodium 100 MG capsule Commonly known as: Colace Take 1 capsule (100 mg total) by mouth 2 (two) times daily for 5 days.   folic acid 1 MG tablet Commonly known  as: FOLVITE Take 1 mg by mouth daily.   furosemide 20 MG tablet Commonly known as: LASIX Take 1 tablet (20 mg total) by mouth as needed. What changed: reasons to take this   gabapentin 300 MG capsule Commonly known as: NEURONTIN Take 300 mg by mouth daily as needed (burning).   hydrochlorothiazide 12.5 MG tablet Commonly known as: HYDRODIURIL Take 12.5 mg by mouth daily as needed (high BP).   latanoprost 0.005 % ophthalmic solution Commonly known as: XALATAN Place 1 drop into both eyes at bedtime.   levothyroxine 100 MCG tablet Commonly known as: Synthroid Take 1 tablet (100 mcg total) by mouth daily before breakfast.   losartan 50 MG tablet Commonly known as: COZAAR Take 1 tablet (50 mg total) by mouth daily.   metFORMIN 500 MG tablet Commonly known as: GLUCOPHAGE Take 250 mg by mouth daily.   mirtazapine 30 MG tablet Commonly known as: REMERON Take 30 mg by mouth at bedtime.   nitroGLYCERIN 0.4 MG SL tablet Commonly known as: NITROSTAT Place 1 tablet (0.4 mg  total) under the tongue every 5 (five) minutes as needed for chest pain.   omeprazole 20 MG capsule Commonly known as: PRILOSEC TAKE ONE CAPSULE BY MOUTH EVERY DAY   oxyCODONE 5 MG immediate release tablet Commonly known as: Roxicodone Take 0.5 tablets (2.5 mg total) by mouth every 6 (six) hours as needed for up to 3 days for severe pain (pain score 7-10).   polyethylene glycol 17 g packet Commonly known as: MiraLax Take 17 g by mouth daily as needed for up to 7 days for moderate constipation or severe constipation.   potassium chloride SA 20 MEQ tablet Commonly known as: KLOR-CON M TAKE 1 TABLET(20 MEQ) BY MOUTH DAILY WITH LASIX AND FUROSEMIDE AS NEEDED What changed: See the new instructions.   propranolol 10 MG tablet Commonly known as: INDERAL Take 5 mg by mouth 3 (three) times daily.   senna 8.6 MG Tabs tablet Commonly known as: SENOKOT Take 2 tablets (17.2 mg total) by mouth daily as needed for moderate constipation. What changed: how much to take   SYSTANE OP Place 1 drop into both eyes as needed (irritation).               Durable Medical Equipment  (From admission, onward)           Start     Ordered   01/28/24 1229  For home use only DME Other see comment  Once       Comments: Patient needs incontinence pull ups since patient has incontinence of urine/ bowel movement  Question:  Length of Need  Answer:  6 Months   01/28/24 1229   01/28/24 1223  For home use only DME Walker rolling  Once       Question Answer Comment  Walker: With 5 Inch Wheels   Patient needs a walker to treat with the following condition Generalized weakness      01/28/24 1222   01/21/24 0814  For home use only DME 4 wheeled rolling walker with seat  Once       Question:  Patient needs a walker to treat with the following condition  Answer:  Debility   01/21/24 0813   01/21/24 0814  For home use only DME 3 n 1  Once        01/21/24 0813            Follow-up Information      [facility] Follow up.   Specialty: Home Health Services Why: Frances Furbish will contact you within 48 hours of DC to arrange a Home Health visit Contact information: [address] [place] [address] [phone]         [facility], [facility] Follow up.   Why: [facility] will deliver a Rolator and 3:1 to the bedside. Contact information: [address] [place] [address] [phone]         Fritzi Mandes, MD. Call.   Specialty: General Surgery Why: We are making a follow up appointment for you., Please call to confirm appointment time., Arrive 30 minutes early to complete check in, and bring photo ID and insurance card. Contact information: [address] [place] [address] [phone]         Amanda Plum, MD Follow up in 1 week(s).   Specialty: Internal Medicine Contact information: [address] [place] [address] [phone]                Allergies  Allergen Reactions   Penicillin G Hives   Shellfish Allergy Hives    Consultations: General surgery   Procedures/Studies: CT ABDOMEN PELVIS W CONTRAST Result Date: 01/25/2024 CLINICAL DATA:  Epigastric pain post-op ileus vs SBO. Partial colectomy and laparotomy on 01/18/2024. History of cholecystectomy. EXAM: CT ABDOMEN AND PELVIS WITH CONTRAST TECHNIQUE: Multidetector CT imaging of the abdomen and pelvis was performed using the standard protocol following bolus administration of intravenous contrast. RADIATION DOSE REDUCTION: This exam was performed according to the departmental dose-optimization program which includes automated exposure control, adjustment of the mA and/or kV according to patient size and/or use of iterative reconstruction technique. CONTRAST:  60mL OMNIPAQUE IOHEXOL 350 MG/ML SOLN COMPARISON:  CT abdomen pelvis 01/17/2024 FINDINGS: Lower chest: Cardiomegaly. Bilateral trace pleural effusions. Bilateral lower lobe passive  atelectasis. Small hiatal hernia. Hepatobiliary: No focal liver abnormality. Status post cholecystectomy. No intrahepatic biliary ductal dilatation. Prominent common bile duct measuring up to 1.1 cm which can be seen in the post cholecystectomy setting. Pancreas: No focal lesion. Normal pancreatic contour. No surrounding inflammatory changes. No main pancreatic ductal dilatation. Spleen: Normal in size. Persistent splenic lesions with the largest heterogeneous 2.8 cm stable mass (3:12). Adrenals/Urinary Tract: No adrenal nodule bilaterally. Bilateral kidneys enhance symmetrically. Fluid density lesions likely represent simple renal cysts. Simple renal cysts, in the absence of clinically indicated signs/symptoms, require no independent follow-up. No hydronephrosis. No hydroureter. The urinary bladder is unremarkable. Stomach/Bowel: Partial colectomy surgical changes noted. Stomach is within normal limits. No evidence of bowel wall thickening or dilatation. Appendix appears normal. Vascular/Lymphatic: No abdominal aorta or iliac aneurysm. Mild atherosclerotic plaque of the aorta and its branches. No abdominal, pelvic, or inguinal lymphadenopathy. Reproductive: Uterus and bilateral adnexa are unremarkable. Other: Trace to small volume simple free fluid ascites. Intraperitoneal free fluid. No intraperitoneal free gas. No organized fluid collection. Musculoskeletal: Supraumbilical ventral hernia containing a short segment of the anti mesenteric wall of the small bowel. Otherwise healing anterior abdominal incision with associated mild subcutaneus soft tissue edema and emphysema consistent with postsurgical changes. Overlying skin staples. Persistent diffusely heterogeneous osseous mineralization. No definite suspicious lytic or blastic osseous lesions. No acute displaced fracture. Multilevel degenerative changes of the spine. IMPRESSION: Supraumbilical Richter ventral hernia containing a short segment of small bowel. No  definite associated small bowel obstruction. Correlate clinically for incarceration. Nonspecific trace to small volume simple free fluid ascites. Bilateral trace pleural effusions. Small hiatal hernia. Persistent splenic  lesions with the largest heterogeneous 2.8 cm stable mass. When the patient is clinically stable and able to follow directions and hold their breath (preferably as an outpatient) further evaluation with dedicated abdominal MRI should be considered. Persistent diffusely heterogeneous osseous mineralization. Cardiomegaly. Aortic Atherosclerosis (ICD10-I70.0). Electronically Signed   By: Tish Frederickson M.D.   On: 01/25/2024 20:39   IR PICC PLACEMENT LEFT >5 YRS INC IMG GUIDE Result Date: 01/25/2024 INDICATION: Request for IR PICC placement after failed PICC attempt at bedside. FOR TPN ACCESS EXAM: ULTRASOUND AND FLUOROSCOPIC GUIDED PICC LINE INSERTION MEDICATIONS: 1% lidocaine 3 mL CONTRAST:  4 mL FLUOROSCOPY: Radiation Exposure Index (as provided by the fluoroscopic device): 7 mGy Kerma COMPLICATIONS: None immediate. TECHNIQUE: The procedure, risks, benefits, and alternatives were explained to the patient and informed consent was obtained. The right upper extremity was prepped with chlorhexidine in a sterile fashion, and a sterile drape was applied covering the operative field. Maximum barrier sterile technique with sterile gowns and gloves were used for the procedure. A timeout was performed prior to the initiation of the procedure. Local anesthesia was provided with 1% lidocaine. Initial attempt at right upper extremity PICC line failed to obtain central access. PICC line was cut short with the tip in the axillary vein. However, access is for TPN which has to be central. Therefore this access was removed after unsuccessful attempts at advancing the catheter and guidewire access centrally related to basal spasm. Attention was directed to the left upper extremity. Under sterile conditions and  local anesthesia, left basilic micropuncture access performed. Guidewire advanced centrally. A 5 [place] dilator set advanced. Measurements obtained. Through the access, a double-lumen 5 French 42 cm PICC line was able to be advanced centrally with the tip position at the SVC RA junction. Images obtained for documentation. The catheter easily aspirated and flushed and was secured in place. A dressing was placed. The patient tolerated the procedure well without immediate post procedural complication. FINDINGS: Unsuccessful attempt at placement of a right PICC line centrally. This access was removed. Successful placement of a left upper extremity double-lumen power PICC line centrally. Tip SVC RA junction. IMPRESSION: Successful ultrasound and fluoroscopic guided placement of a left basilic vein approach, 42 cm, 5 French, dual lumen PICC with tip at the superior caval-atrial junction. The PICC line is ready for immediate use. Addendum: After discussion with medical team, the PICC line was intended for parenteral nutrition. Parenteral nutrition can not be given via midline catheter. Catheter will be removed and a left upper extremity PICC line was able to be placed as above. Procedure performed by: Corrin Parker, PA-C initially. Left upper extremity PICC line placed by Dr. Miles Costain Electronically Signed   By: Judie Petit.  Shick M.D.   On: 01/25/2024 16:06   [place] EKG SITE RITE Result Date: 01/24/2024 If Site Rite image not attached, placement could not be confirmed due to current cardiac rhythm.  DG CHEST PORT 1 VIEW Result Date: 01/24/2024 CLINICAL DATA:  Paik flush. EXAM: PORTABLE CHEST 1 VIEW COMPARISON:  Radiograph 01/19/2024, CT 01/20/2024 FINDINGS: Right upper extremity PICC tip is in the of the mediastinum in the region of the lower brachiocephalic vein. Enteric tube remains in place. Again seen cardiomegaly. Aortic tortuosity. Slight increasing left basilar opacity. Similar right pleural effusion and basilar opacity. No  pneumothorax. IMPRESSION: 1. Right upper extremity PICC tip is in the region of the lower brachiocephalic vein. 2. Slight increasing left basilar opacity, may represent atelectasis or increasing effusion. Similar right pleural effusion and  basilar opacity. 3. Cardiomegaly. Electronically Signed   By: Narda Rutherford M.D.   On: 01/24/2024 20:27   DG Abd 1 View Result Date: 01/24/2024 CLINICAL DATA:  88 year old female nasogastric tube placement. EXAM: ABDOMEN - 1 VIEW COMPARISON:  01/23/2024. FINDINGS: Single view at 0939 hours. Enteric tube terminates in the left upper quadrant, looped at the level of the gastric body. Midline abdominal skin staples again noted. Gas distended small bowel loops not significantly improved from yesterday. Questionably less large bowel gas now. Lung bases not significantly changed. IMPRESSION: 1. Satisfactory enteric tube placement in the stomach. 2. Persistent small bowel obstruction. Electronically Signed   By: Odessa Fleming M.D.   On: 01/24/2024 10:40   DG Abd Portable 1V Result Date: 01/23/2024 CLINICAL DATA:  Bowel obstruction EXAM: PORTABLE ABDOMEN - 1 VIEW COMPARISON:  01/23/2024 at 11:48 a.m. FINDINGS: Two supine frontal views of the abdomen and pelvis are obtained. Partial visualization of the enteric catheter looped back upon itself within the esophagus seen on prior study. Recommend removal. Surgical stables from midline laparotomy. There is persistent gaseous distention of the small bowel, measuring up to 3.6 cm in diameter. Paucity of colonic gas. No masses or abnormal calcifications. IMPRESSION: 1. Stable small bowel obstruction. No change in caliber of the small bowel. 2. Partial visualization of the malpositioned enteric catheter noted on prior exam. Recommend removal and replacement. Findings were called to the patient's nurse, Rayfield Citizen, at 7:43 p.m. Electronically Signed   By: Sharlet Salina M.D.   On: 01/23/2024 19:56   DG Abd Portable 1V Result Date:  01/23/2024 CLINICAL DATA:  Enteric tube placement.  Bowel obstruction. EXAM: PORTABLE ABDOMEN - 1 VIEW COMPARISON:  Abdominal x-ray from yesterday. FINDINGS: The enteric tube is looped back on itself in the esophagus with the tip above the thoracic inlet. Unchanged small bowel dilatation. Unchanged cardiomegaly and small left-greater-than-right pleural effusions with bibasilar atelectasis. Unchanged right upper extremity PICC line. IMPRESSION: 1. The enteric tube is looped back on itself in the esophagus with the tip above the thoracic inlet. Recommend repositioning. 2. Unchanged small bowel obstruction. Electronically Signed   By: Obie Dredge M.D.   On: 01/23/2024 14:18   DG Abd Portable 1V Result Date: 01/22/2024 CLINICAL DATA:  NG tube placement. EXAM: PORTABLE ABDOMEN - 1 VIEW COMPARISON:  Radiograph yesterday FINDINGS: Tip and side port of the enteric tube below the diaphragm in the stomach. Gaseous small bowel distention in the central abdomen. Midline skin staples again seen. IMPRESSION: Tip and side port of the enteric tube below the diaphragm in the stomach. Electronically Signed   By: Narda Rutherford M.D.   On: 01/22/2024 21:11   ECHOCARDIOGRAM COMPLETE Result Date: 01/22/2024    ECHOCARDIOGRAM REPORT   Patient Name:   [name] Date of Exam: 01/22/2024 Medical Rec #:  [id number]   Height:       61.0 in Accession #:    [id number]  Weight:       138.4 lb Date of Birth:  1936/05/26    BSA:          1.615 m Patient Age:    88 years    BP:           176/100 mmHg Patient Gender: F           HR:           86 bpm. Exam Location:  Inpatient Procedure: 2D Echo, Color Doppler and Cardiac Doppler (Both Spectral and Color  Flow Doppler were utilized during procedure). Indications:    Abnormal ECG  History:        Patient has prior history of Echocardiogram examinations.                 Signs/Symptoms:Chest Pain; Risk Factors:Hypertension and                 Dyslipidemia.  Sonographer:    Lamont Snowball Referring Phys: [phone] DANIEL V THOMPSON IMPRESSIONS  1. Left ventricular ejection fraction, by estimation, is 60 to 65%. The left ventricle has normal function. The left ventricle has no regional wall motion abnormalities. Left ventricular diastolic parameters are consistent with Grade I diastolic dysfunction (impaired relaxation).  2. Right ventricular systolic function is normal. The right ventricular size is normal.  3. Left atrial size was severely dilated.  4. The mitral valve is normal in structure. Mild mitral valve regurgitation. No evidence of mitral stenosis.  5. The aortic valve is tricuspid. Aortic valve regurgitation is mild. Aortic valve sclerosis/calcification is present, without any evidence of aortic stenosis.  6. Aortic dilatation noted. There is mild dilatation of the ascending aorta, measuring 41 mm.  7. The inferior vena cava is normal in size with greater than 50% respiratory variability, suggesting right atrial pressure of 3 mmHg. Comparison(s): No prior Echocardiogram. FINDINGS  Left Ventricle: Left ventricular ejection fraction, by estimation, is 60 to 65%. The left ventricle has normal function. The left ventricle has no regional wall motion abnormalities. The left ventricular internal cavity size was normal in size. There is  no left ventricular hypertrophy. Left ventricular diastolic parameters are consistent with Grade I diastolic dysfunction (impaired relaxation). Right Ventricle: The right ventricular size is normal. Right ventricular systolic function is normal. Left Atrium: Left atrial size was severely dilated. Right Atrium: Right atrial size was normal in size. Pericardium: There is no evidence of pericardial effusion. Mitral Valve: The mitral valve is normal in structure. Mild mitral valve regurgitation. No evidence of mitral valve stenosis. MV peak gradient, 3.8 mmHg. The mean mitral valve gradient is 1.7 mmHg. Tricuspid Valve: The tricuspid valve is normal in structure.  Tricuspid valve regurgitation is mild . No evidence of tricuspid stenosis. Aortic Valve: The aortic valve is tricuspid. Aortic valve regurgitation is mild. Aortic regurgitation PHT measures 686 msec. Aortic valve sclerosis/calcification is present, without any evidence of aortic stenosis. Aortic valve mean gradient measures 7.7  mmHg. Aortic valve peak gradient measures 15.4 mmHg. Aortic valve area, by VTI measures 2.07 cm. Pulmonic Valve: The pulmonic valve was normal in structure. Pulmonic valve regurgitation is mild. No evidence of pulmonic stenosis. Aorta: Aortic dilatation noted. There is mild dilatation of the ascending aorta, measuring 41 mm. Venous: The inferior vena cava is normal in size with greater than 50% respiratory variability, suggesting right atrial pressure of 3 mmHg. IAS/Shunts: No atrial level shunt detected by color flow Doppler.  LEFT VENTRICLE PLAX 2D LVIDd:         4.00 cm LVIDs:         3.10 cm LV PW:         1.10 cm LV IVS:        1.10 cm LVOT diam:     2.10 cm LV SV:         77 LV SV Index:   48 LVOT Area:     3.46 cm  RIGHT VENTRICLE          IVC RV Basal diam:  3.70 cm  IVC  diam: 0.90 cm LEFT ATRIUM             Index        RIGHT ATRIUM           Index LA diam:        3.10 cm 1.92 cm/m   RA Area:     19.00 cm LA Vol (A2C):   92.5 ml 57.26 ml/m  RA Volume:   51.70 ml  32.00 ml/m LA Vol (A4C):   72.2 ml 44.69 ml/m LA Biplane Vol: 80.9 ml 50.08 ml/m  AORTIC VALVE AV Area (Vmax):    2.09 cm AV Area (Vmean):   2.03 cm AV Area (VTI):     2.07 cm AV Vmax:           196.00 cm/s AV Vmean:          129.000 cm/s AV VTI:            0.373 m AV Peak Grad:      15.4 mmHg AV Mean Grad:      7.7 mmHg LVOT Vmax:         118.40 cm/s LVOT Vmean:        75.660 cm/s LVOT VTI:          0.223 m LVOT/AV VTI ratio: 0.60 AI PHT:            686 msec  AORTA Ao Root diam: 3.10 cm Ao Asc diam:  4.15 cm MITRAL VALVE               TRICUSPID VALVE MV Area (PHT): 3.24 cm    TR Peak grad:   43.0 mmHg MV Area  VTI:   3.90 cm    TR Vmax:        328.00 cm/s MV Peak grad:  3.8 mmHg MV Mean grad:  1.7 mmHg    SHUNTS MV Vmax:       0.98 m/s    Systemic VTI:  0.22 m MV Vmean:      59.9 cm/s   Systemic Diam: 2.10 cm MV Decel Time: 235 msec MV E velocity: 54.00 cm/s MV A velocity: 97.60 cm/s MV E/A ratio:  0.55 Olga Millers MD Electronically signed by Olga Millers MD Signature Date/Time: 01/22/2024/12:08:45 PM    Final    DG Abd Portable 1V Result Date: 01/21/2024 CLINICAL DATA:  Nasogastric tube placement. EXAM: PORTABLE ABDOMEN - 1 VIEW COMPARISON:  Radiograph 01/18/2024 FINDINGS: Tip and side port of the enteric tube below the diaphragm in the stomach. Gaseous small bowel distention in the left abdomen. Midline skin staples in place. IMPRESSION: Tip and side port of the enteric tube below the diaphragm in the stomach. Electronically Signed   By: Narda Rutherford M.D.   On: 01/21/2024 20:59   [place] EKG SITE RITE Result Date: 01/21/2024 If Site Rite image not attached, placement could not be confirmed due to current cardiac rhythm.  CT CHEST WO CONTRAST Result Date: 01/20/2024 CLINICAL DATA:  Abnormal chest x-ray EXAM: CT CHEST WITHOUT CONTRAST TECHNIQUE: Multidetector CT imaging of the chest was performed following the standard protocol without IV contrast. RADIATION DOSE REDUCTION: This exam was performed according to the departmental dose-optimization program which includes automated exposure control, adjustment of the mA and/or kV according to patient size and/or use of iterative reconstruction technique. COMPARISON:  None Available. FINDINGS: Cardiovascular: Marked cardiac enlargement. No pericardial effusion. The aorta is elongated with ascending segment measuring up to 3.9 cm and transverse segment measuring up  to 3.3 cm. Radiographic abnormality is primarily related to marked enlargement the main pulmonary artery which is 5.2 cm in diameter. Mediastinum/Nodes: Negative for mass or adenopathy. Unremarkable  enteric tube traversing the esophagus. Lungs/Pleura: Dependent atelectasis with trace pleural fluid. No interstitial edema. Few pulmonary cysts are seen in the apical lungs. Upper Abdomen: Small volume ascites and pneumoperitoneum seen in the upper abdomen. An enteric tube at least reaches the stomach. The patient underwent recent colectomy. Musculoskeletal: Osteopenia.  No acute finding IMPRESSION: Radiographic abnormality correlates with marked main pulmonary artery enlargement compatible with pulmonary hypertension. The aorta is also elongated and tortuous. No mass or adenopathy the chest. Dependent atelectasis with small volume pleural fluid. Electronically Signed   By: Tiburcio Pea M.D.   On: 01/20/2024 04:09   DG CHEST PORT 1 VIEW Result Date: 01/19/2024 CLINICAL DATA:  Chest pain EXAM: PORTABLE CHEST 1 VIEW COMPARISON:  04/18/2018, 01/17/2024 FINDINGS: Single frontal view of the chest demonstrates enteric catheter passing below diaphragm, tip and side port projecting over the region of the gastric body. Cardiac silhouette is enlarged. There is increased density in the AP window, which could reflect dilated pulmonary artery, aortic aneurysm, or mediastinal adenopathy. CT chest with IV contrast recommended. No airspace disease, effusion, or pneumothorax. No acute bony abnormalities. Surgical clips midline upper abdomen consistent with recent laparotomy. Diffuse gaseous distention of the bowel may reflect residual obstruction or postoperative ileus. IMPRESSION: 1. Increased density in the AP window, which may reflect dilated pulmonary artery, descending thoracic aortic aneurysm, or lymphadenopathy. Further evaluation with chest CT is recommended. According to the referring physician, there is a history of underlying acute renal insufficiency. An unenhanced chest CT could be performed to identify the etiology the chest x-ray finding, with any further evaluation requiring IV contrast dictated by the findings  on unenhanced CT and clinical picture. 2. Stable enlarged cardiac silhouette. 3. No acute airspace disease. 4. Enteric catheter tip projecting over gastric body. Persistent gas distention of the bowel may reflect residual obstruction or postoperative ileus. Critical Value/emergent results were called by telephone at the time of interpretation on 01/19/2024 at 5:20 pm to provider [facility] , who verbally acknowledged these results. Electronically Signed   By: Sharlet Salina M.D.   On: 01/19/2024 17:29   DG Abd Portable 1V Result Date: 01/18/2024 CLINICAL DATA:  Nasogastric tube placement. EXAM: PORTABLE ABDOMEN - 1 VIEW COMPARISON:  Same day. FINDINGS: Distal tip of nasogastric tube has been advanced slightly beyond gastroesophageal junction, although distal side hole remains in expected position of the esophagus. IMPRESSION: Distal tip of nasogastric tube has been advanced slightly beyond gastroesophageal junction. Continued advancement is recommended. Electronically Signed   By: Lupita Raider M.D.   On: 01/18/2024 09:56   DG Abd 1 View Result Date: 01/18/2024 CLINICAL DATA:  Nasogastric tube placement. EXAM: ABDOMEN - 1 VIEW COMPARISON:  January 17, 2024. FINDINGS: Distal tip of nasogastric tube is seen at expected position of gastroesophageal junction. Advancement is recommended. IMPRESSION: Distal tip of nasogastric tube seen in expected position of gastroesophageal junction. Advancement is recommended. Electronically Signed   By: Lupita Raider M.D.   On: 01/18/2024 09:56   DG Abd Portable 1 View Result Date: 01/18/2024 CLINICAL DATA:  NG tube placement. EXAM: PORTABLE ABDOMEN - 1 VIEW COMPARISON:  Abdominal CT earlier today FINDINGS: Tip of the enteric tube is below the diaphragm in the stomach, the side port is in the region of the gastroesophageal junction. Advancement of 3-4 cm would place  the side-port below the diaphragm. Small-bowel distention on CT is fluid-filled and not well demonstrated  by radiograph. There is excreted IV contrast in the renal collecting systems. IMPRESSION: Tip of the enteric tube is below the diaphragm in the stomach, the side port is in the region of the gastroesophageal junction. Advancement of 3-4 cm would place the side-port below the diaphragm. Electronically Signed   By: Narda Rutherford M.D.   On: 01/18/2024 00:01   CT ABDOMEN PELVIS W CONTRAST Result Date: 01/17/2024 CLINICAL DATA:  Abdomen pain EXAM: CT ABDOMEN AND PELVIS WITH CONTRAST TECHNIQUE: Multidetector CT imaging of the abdomen and pelvis was performed using the standard protocol following bolus administration of intravenous contrast. RADIATION DOSE REDUCTION: This exam was performed according to the departmental dose-optimization program which includes automated exposure control, adjustment of the mA and/or kV according to patient size and/or use of iterative reconstruction technique. CONTRAST:  75mL OMNIPAQUE IOHEXOL 350 MG/ML SOLN COMPARISON:  Chest CT 07/12/2014 FINDINGS: Lower chest: Lung bases demonstrate no acute airspace disease. Minimal bronchiectasis and atelectasis or scar at the bases. Cardiomegaly. Small hiatal hernia Hepatobiliary: Cholecystectomy. Intra and extrahepatic biliary dilatation, common bile duct dilated up to 13 mm. Pancreas: Unremarkable. No pancreatic ductal dilatation or surrounding inflammatory changes. Spleen: Subcentimeter hypodense splenic lesions too small to further characterize though some are stable compared with chest CT from 2015, however interval development of indeterminate mass within the anterior spleen measuring 2.7 cm on series 3, image 10. Vague indeterminate hypodense mass in the inferior spleen measuring 15 mm on series 3, image 19. Adrenals/Urinary Tract: Left adrenal gland is normal. 7 mm hypodensity in the right adrenal gland on series 3, image 16 too small to further characterize, no specific imaging follow-up recommended. Kidneys show no hydronephrosis.  Renal cysts for which no imaging follow-up is recommended. Cortical atrophy lower pole left kidney. Generalized hypoenhancement of the lower pole left kidney, delayed series 8, image 18. The urinary bladder is unremarkable. Stomach/Bowel: Stomach nonenlarged. Multiple loops of dilated mid to distal small bowel, measuring up to 3.1 cm. Obstruction is secondary to a large heterogenous ileo cecal/ascending colon mass, this measures approximately 7 by 5.1 by 7.3 cm on series 3, image 31 and coronal series 6, image 59. Colon distal to the mass is completely decompressed. No extraluminal gas to suggest perforation. Vascular/Lymphatic: Aortic atherosclerosis. No aneurysm. Enlarged mesenteric nodes in the vicinity of colon mass, for example 15 mm lymph node on series 3, image 34 and adjacent nodes measuring up to 9 mm on series 3, image 34. Reproductive: Uterus and bilateral adnexa are unremarkable. Other: No free air. Small volume free fluid in the pelvis. Numerous fat containing ventral hernias with some fluid in the hernia sacs. Musculoskeletal: Heterogenous osseous mineralization of the spine and pelvis. No fracture IMPRESSION: 1. Findings consistent with small-bowel obstruction secondary to large ileocecal/ascending colon mass measuring up to 7.3 cm. No extraluminal gas to suggest perforation. Findings concerning for colon malignancy. 2. Enlarged mesenteric nodes in the vicinity of colon mass, suspicious for metastatic adenopathy. 3. Indeterminate splenic masses measuring up to 2.7 cm. When the patient is clinically stable and able to follow directions and hold their breath (preferably as an outpatient) further evaluation with dedicated abdominal MRI should be considered. 4. Heterogenous osseous mineralization of the spine and pelvis, could be due to osteopenia but marrow disease not excluded 5. Small volume free fluid in the pelvis. 6. Numerous fat containing ventral hernias with some fluid in the hernia sacs. 7.  Cholecystectomy with intra and extrahepatic biliary dilatation. Correlation with LFTs is suggested. 8. Cortical atrophy lower pole left kidney with generalized hypoenhancement of the lower pole left kidney, question pyelonephritis, appearance not typical for infarct 9. Aortic atherosclerosis. Electronically Signed   By: Jasmine Pang M.D.   On: 01/17/2024 21:07     Discharge Exam: Vitals:   01/29/24 1940 01/30/24 0414  BP: 138/82 138/76  Pulse: 71 66  Resp: 18 18  Temp: 98.6 F (37 C) 98.1 F (36.7 C)  SpO2: 100% 99%   Vitals:   01/29/24 1720 01/29/24 1940 01/30/24 0414 01/30/24 0451  BP: 130/69 138/82 138/76   Pulse: 74 71 66   Resp:  18 18   Temp:  98.6 F (37 C) 98.1 F (36.7 C)   TempSrc:  Oral Oral   SpO2:  100% 99%   Weight:    68.4 kg  Height:        General: Pt is alert, awake, not in acute distress Cardiovascular: RRR, S1/S2 +, no rubs, no gallops Respiratory: CTA bilaterally, no wheezing, no rhonchi Abdominal: Soft, NT, ND, bowel sounds +, has staples Extremities: no edema, no cyanosis    The results of significant diagnostics from this hospitalization (including imaging, microbiology, ancillary and laboratory) are listed below for reference.     Microbiology: No results found for this or any previous visit (from the past 240 hours).   Labs: BNP (last 3 results) No results for input(s): "BNP" in the last 8760 hours. Basic Metabolic Panel: Recent Labs  Lab 01/25/24 0614 01/26/24 0507 01/27/24 0247 01/28/24 0228 01/29/24 0418  NA 139 137 138 139 142  K 3.3* 4.6 4.1 3.9 4.3  CL 103 106 105 107 108  CO2 24 25 26 26 26   GLUCOSE 136* 167* 130* 140* 140*  BUN 16 16 23  29* 28*  CREATININE 1.22* 1.19* 1.04* 1.12* 1.12*  CALCIUM 8.7* 8.4* 8.4* 8.5* 8.7*  MG 2.2 2.1 2.3 2.2 2.0  PHOS 3.2 3.3 4.0 3.9 4.1   Liver Function Tests: Recent Labs  Lab 01/25/24 0614 01/26/24 0507 01/27/24 0247 01/28/24 0228 01/29/24 0418  AST 27 28 26 22 20   ALT 17 19  20 16 14   ALKPHOS 60 62 59 55 54  BILITOT 0.6 0.5 0.6 0.4 0.4  PROT 5.8* 5.6* 5.6* 5.5* 6.0*  ALBUMIN 2.3* 2.2* 2.2* 2.3* 2.3*   No results for input(s): "LIPASE", "AMYLASE" in the last 168 hours. No results for input(s): "AMMONIA" in the last 168 hours. CBC: Recent Labs  Lab 01/25/24 0614 01/26/24 0507 01/27/24 0247 01/28/24 0228 01/29/24 0418  WBC 10.9* 10.8* 9.9 11.4* 9.4  NEUTROABS 7.1 7.8* 6.9 8.4* 6.4  HGB 11.6* 10.5* 9.9* 9.2* 9.3*  HCT 35.5* 33.8* 31.2* 29.4* 29.5*  MCV 80.9 83.3 83.2 84.2 84.5  PLT 677* 617* 557* 479* 479*   Cardiac Enzymes: No results for input(s): "CKTOTAL", "CKMB", "CKMBINDEX", "TROPONINI" in the last 168 hours. BNP: Invalid input(s): "POCBNP" CBG: Recent Labs  Lab 01/26/24 1659 01/26/24 2017 01/26/24 2334 01/27/24 0425 01/29/24 1513  GLUCAP 138* 140* 132* 131* 117*   D-Dimer No results for input(s): "DDIMER" in the last 72 hours. Hgb A1c No results for input(s): "HGBA1C" in the last 72 hours. Lipid Profile Recent Labs    01/28/24 0228  TRIG 129   Thyroid function studies No results for input(s): "TSH", "T4TOTAL", "T3FREE", "THYROIDAB" in the last 72 hours.  Invalid input(s): "FREET3" Anemia work up No results for input(s): "VITAMINB12", "FOLATE", "FERRITIN", "TIBC", "  IRON", "RETICCTPCT" in the last 72 hours. Urinalysis    Component Value Date/Time   COLORURINE YELLOW 01/17/2024 2010   APPEARANCEUR HAZY (A) 01/17/2024 2010   LABSPEC 1.028 01/17/2024 2010   PHURINE 5.0 01/17/2024 2010   GLUCOSEU NEGATIVE 01/17/2024 2010   HGBUR NEGATIVE 01/17/2024 2010   BILIRUBINUR NEGATIVE 01/17/2024 2010   KETONESUR NEGATIVE 01/17/2024 2010   PROTEINUR 30 (A) 01/17/2024 2010   NITRITE NEGATIVE 01/17/2024 2010   LEUKOCYTESUR MODERATE (A) 01/17/2024 2010   Sepsis Labs Recent Labs  Lab 01/26/24 0507 01/27/24 0247 01/28/24 0228 01/29/24 0418  WBC 10.8* 9.9 11.4* 9.4   Microbiology No results found for this or any previous visit  (from the past 240 hours).  FURTHER DISCHARGE INSTRUCTIONS:   Get Medicines reviewed and adjusted: Please take all your medications with you for your next visit with your Primary MD   Laboratory/radiological data: Please request your Primary MD to go over all hospital tests and procedure/radiological results at the follow up, please ask your Primary MD to get all Hospital records sent to his/her office.   In some cases, they will be blood work, cultures and biopsy results pending at the time of your discharge. Please request that your primary care M.D. goes through all the records of your hospital data and follows up on these results.   Also Note the following: If you experience worsening of your admission symptoms, develop shortness of breath, life threatening emergency, suicidal or homicidal thoughts you must seek medical attention immediately by calling 911 or calling your MD immediately  if symptoms less severe.   You must read complete instructions/literature along with all the possible adverse reactions/side effects for all the Medicines you take and that have been prescribed to you. Take any new Medicines after you have completely understood and accpet all the possible adverse reactions/side effects.    Do not drive when taking Pain medications or sleeping medications (Benzodaizepines)   Do not take more than prescribed Pain, Sleep and Anxiety Medications. It is not advisable to combine anxiety,sleep and pain medications without talking with your primary care practitioner   Special Instructions: If you have smoked or chewed Tobacco  in the last 2 yrs please stop smoking, stop any regular Alcohol  and or any Recreational drug use.   Wear Seat belts while driving.   Please note: You were cared for by a hospitalist during your hospital stay. Once you are discharged, your primary care physician will handle any further medical issues. Please note that NO REFILLS for any discharge  medications will be authorized once you are discharged, as it is imperative that you return to your primary care physician (or establish a relationship with a primary care physician if you do not have one) for your post hospital discharge needs so that they can reassess your need for medications and monitor your lab values  Time coordinating discharge: Over 30 minutes  SIGNED:   Hughie Closs, MD  [facility] 01/30/2024, 11:28 AM *Please note that this is a verbal dictation therefore any spelling or grammatical errors are due to the "[employer]" system interpretation. If 7PM-7AM, please contact night-coverage [email or web address]

## 2024-01-31 ENCOUNTER — Telehealth: Payer: Self-pay

## 2024-01-31 NOTE — Transitions of Care (Post Inpatient/ED Visit) (Addendum)
 01/31/2024  Name: [name] MRN: [id number] DOB: Jun 03, 1936  Today's TOC FU Call Status: Today's TOC FU Call Status:: Successful TOC FU Call Completed TOC FU Call Complete Date: 01/31/24 Patient's Name and Date of Birth confirmed.  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: How have you been since you were released from the hospital?: Better Any questions or concerns?: Yes Patient Questions/Concerns:: Equipment, wound care, Help at home Patient Questions/Concerns Addressed: Other:, Notified Provider of Patient Questions/Concerns  Items Reviewed: Did you receive and understand the discharge instructions provided?: Yes Medications obtained,verified, and reconciled?: Yes (Medications Reviewed) (Medication reconciliation completed based on recent discharge summary Patient taking medications as instructed and is aware of any changes or dosage adjustments medication regimen. Patient denies questions and reports no barriers to medication adherence) Any new allergies since your discharge?: No Dietary orders reviewed?: Yes Type of Diet Ordered:: Reg Heart Healthy Do you have support at home?: Yes People in Home: child(ren), adult Name of Support/Comfort Primary Source: Daughter Antionette, son Sharon Seller lives close  Medications Reviewed Today: Medications Reviewed Today     Reviewed by Johnnette Barrios, RN (Registered Nurse) on 01/31/24 at 1123  Med List Status: <None>   Medication Order Taking? Sig Documenting Provider Last Dose Status Informant  acetaminophen (TYLENOL) 325 MG tablet [id number] Yes Take 325 mg by mouth as needed for moderate pain (pain score 4-6), headache or mild pain (pain score 1-3). [provider] Taking Active  Child, Pharmacy Records  amLODipine (NORVASC) 5 MG tablet [id number] Yes Take 5 mg by mouth at bedtime. [provider] Taking Active Child, Pharmacy Records  anagrelide (AGRYLIN) 0.5 MG capsule [id number] Yes Take 2 capsules (1 mg) by mouth 2 times daily. Serena Croissant, MD Taking Active Child, Pharmacy Records  docusate sodium (COLACE) 100 MG capsule [id number] Yes Take 1 capsule (100 mg total) by mouth 2 (two) times daily for 5 days. Eric Form, PA-C Taking Active   folic acid (FOLVITE) 1 MG tablet [id number] Yes Take 1 mg by mouth daily. [provider] Taking Active Child, Pharmacy Records  furosemide (LASIX) 20 MG tablet [id number] Yes Take 1 tablet (20 mg total) by mouth as needed.  Patient taking differently: Take 20 mg by mouth as needed for fluid or edema.   Elder Negus, MD Taking Active Child, Pharmacy Records  gabapentin (NEURONTIN) 300 MG capsule [id number] Yes Take 300 mg by mouth daily as needed (burning). [provider] Taking Active Child, Pharmacy Records           Med Note (CRUTHIS, CHLOE C   Fri Jan 18, 2024  9:01 AM) Daughter is unsure of last dose.   hydrochlorothiazide (HYDRODIURIL) 12.5 MG tablet [id number] Yes Take 12.5 mg by mouth daily as needed (high BP). [provider] Taking Active Child, Pharmacy Records           Med Note (CRUTHIS, CHLOE C   Fri Jan 18, 2024  9:02 AM) Daughter is adamant the pt is using this medication PRN for high BP   latanoprost (XALATAN) 0.005 % ophthalmic solution [id number] Yes Place 1 drop into both eyes at bedtime. [provider] Taking Active Child, Pharmacy Records  levothyroxine (SYNTHROID) 100 MCG tablet [id number] Yes Take 1 tablet (100 mcg total) by mouth daily before breakfast. Reuben Likes, MD Taking Active Child, Pharmacy  Records  losartan (COZAAR) 50 MG tablet [id number] Yes Take 1 tablet (50 mg total) by mouth daily. Elder Negus, MD Taking Active Child, Pharmacy Records   metFORMIN (GLUCOPHAGE) 500 MG tablet [id number] Yes Take 250 mg by mouth daily. [provider] Taking Active Child, Pharmacy Records           Med Note (CRUTHIS, CHLOE C   Fri Jan 18, 2024  9:02 AM) Daughter is adamant this is how the pt is taking this medication.   mirtazapine (REMERON) 30 MG tablet [id number] Yes Take 30 mg by mouth at bedtime. [provider] Taking Active Child, Pharmacy Records  nitroGLYCERIN (NITROSTAT) 0.4 MG SL tablet [id number] Yes Place 1 tablet (0.4 mg total) under the tongue every 5 (five) minutes as needed for chest pain. Elder Negus, MD Taking Active Child, Pharmacy Records           Med Note (CRUTHIS, CHLOE C   Fri Jan 18, 2024  9:03 AM) Last dose is unknown   omeprazole (PRILOSEC) 20 MG capsule [id number] Yes TAKE ONE CAPSULE BY MOUTH EVERY DAY Patwardhan, Anabel Bene, MD Taking Active Child, Pharmacy Records  oxyCODONE (ROXICODONE) 5 MG immediate release tablet [id number] Yes Take 0.5 tablets (2.5 mg total) by mouth every 6 (six) hours as needed for up to 3 days for severe pain (pain score 7-10). Eric Form, PA-C Taking Active   Polyethyl Glycol-Propyl Glycol (SYSTANE OP) [id number] Yes Place 1 drop into both eyes as needed (irritation). [provider] Taking Active Child, Pharmacy Records  polyethylene glycol (MIRALAX) 17 g packet [id number] Yes Take 17 g by mouth daily as needed for up to 7 days for moderate constipation or severe constipation. Eric Form, PA-C Taking Active   potassium chloride SA (KLOR-CON) 20 MEQ tablet [id number] Yes TAKE 1 TABLET(20 MEQ) BY MOUTH DAILY WITH LASIX AND FUROSEMIDE AS NEEDED  Patient taking differently: Take 20 mEq by mouth daily as needed (when taking lasix).   Patwardhan, Anabel Bene, MD Taking Active Child, Pharmacy Records  propranolol (INDERAL) 10 MG tablet [id number] Yes Take 5 mg by mouth 3 (three) times daily. [provider] Taking Active Child, Pharmacy Records  senna  (SENOKOT) 8.6 MG TABS tablet [id number] Yes Take 2 tablets (17.2 mg total) by mouth daily as needed for moderate constipation.  Patient taking differently: Take 1 tablet by mouth daily as needed for moderate constipation.   Elease Etienne, MD Taking Active Child, Pharmacy Records           Med Note (CRUTHIS, CHLOE C   Fri Jan 18, 2024  9:03 AM) Daughter is unsure of last dose.   vitamin B-12 (CYANOCOBALAMIN) 1000 MCG tablet [id number] Yes Take 1,000 mcg by mouth daily. [provider] Taking Active Child, Pharmacy Records           Medication reconciliation / review completed based on most recent discharge summary and EHR medication list. Confirmed patient is taking all newly prescribed medications as instructed (any discrepancies are noted in review section)   Patient / Caregiver is aware of any changes to and / or  any dosage adjustments to medication regimen. Patient/ Caregiver denies questions at this time and reports no barriers to medication adherence.  Reviewed B/P 150/92.153/97 discussed Prn B/P medications and Parameters form high and low B/P- B/P during call  108/73 She will discuss with PCP. She does not weigh daily Daughter stated she is getting a scale weight in hospital 150. Reviewed calculating lbs  vs Kg with daughter  Medications,changes and  updates reviewed with patient and daughter  START taking: docusate sodium (Colace) Commonly known as: Colace Take 1 capsule (100 mg total) by mouth 2 (two) times daily for 5 days.   oxyCODONE (Roxicodone) Commonly known as: Roxicodone Take 0.5 tablets (2.5 mg total) by mouth every 6 (six) hours as needed for up to 3 days for severe pain (pain score 7-10).   polyethylene glycol (MiraLax) Commonly known as: MiraLax Take 17 g by mouth daily as needed for up to 7 days for moderate constipation or severe constipation.  Changes  furosemide 20 MG tablet Commonly known as: LASIX Take 1 tablet (20 mg total) by mouth as  needed. What changed: reasons to take this  senna 8.6 MG Tabs tablet Commonly known as: SENOKOT Take 2 tablets (17.2 mg total) by mouth daily as needed for moderate constipation. What changed: how much to take  STOP taking: meloxicam 7.5 MG tablet (MOBIC)  Review your  medication list below  acetaminophen 325 MG tablet Commonly known as: TYLENOL Take 325 mg by mouth as needed for moderate pain (pain score 4-6), headache or mild pain (pain score 1-3).    amLODipine 5 MG tablet Commonly known as: NORVASC Take 5 mg by mouth at bedtime.    anagrelide 0.5 MG capsule Commonly known as: AGRYLIN Take 2 capsules (1 mg) by mouth 2 times daily.    cyanocobalamin 1000 MCG tablet Commonly known as: VITAMIN B12 Take 1,000 mcg by mouth daily.    folic acid 1 MG tablet Commonly known as: FOLVITE Take 1 mg by mouth daily.    gabapentin 300 MG capsule Commonly known as: NEURONTIN Take 300 mg by mouth daily as needed (burning).    hydrochlorothiazide 12.5 MG tablet Commonly known as: HYDRODIURIL Take 12.5 mg by mouth daily as needed (high BP).    latanoprost 0.005 % ophthalmic solution Commonly known as: XALATAN Place 1 drop into both eyes at bedtime.    levothyroxine 100 MCG tablet Commonly known as: Synthroid Take 1 tablet (100 mcg total) by mouth daily before breakfast.    losartan 50 MG tablet Commonly known as: COZAAR Take 1 tablet (50 mg total) by mouth daily.    metFORMIN 500 MG tablet Commonly known as: GLUCOPHAGE Take 250 mg by mouth daily.    mirtazapine 30 MG tablet Commonly known as: REMERON Take 30 mg by mouth at bedtime.    nitroGLYCERIN 0.4 MG SL tablet Commonly known as: NITROSTAT Place 1 tablet (0.4 mg total) under the tongue every 5 (five) minutes as needed for chest pain.    omeprazole 20 MG capsule Commonly known as: PRILOSEC TAKE ONE CAPSULE BY MOUTH EVERY DAY    potassium chloride SA 20 MEQ tablet Commonly known as: KLOR-CON M TAKE 1 TABLET(20  MEQ) BY MOUTH DAILY WITH LASIX AND FUROSEMIDE AS NEEDED What changed: See the new instructions.    propranolol 10 MG tablet Commonly known as: INDERAL Take 5 mg by mouth 3 (three) times daily.    SYSTANE OP Place 1 drop into both eyes as needed (irritation).      Home Care and Equipment/Supplies: Were Home Health Services Ordered?: Yes [facility] of [facility] Health Agency:: [facility] [phone] Has Agency set up a time to come to your home?: No EMR reviewed for Home Health Orders: Orders present/patient has not received call (refer to CM for follow-up) Any new equipment or medical supplies ordered?: Yes Name of Medical supply agency?: [employer] [phone] Were you able  to get the equipment/medical supplies?: Yes (She has the Rollator, she still needs 3:1) Do you have any questions related to the use of the equipment/supplies?: Yes What questions do you have?: use of 3:1  Functional Questionnaire: Do you need assistance with bathing/showering or dressing?: Yes Do you need assistance with meal preparation?: Yes Do you need assistance with eating?: No Do you have difficulty maintaining continence: Yes she needs incontinence pull ups since patient has incontinence of urine/ bowel movement   Do you need assistance with getting out of bed/getting out of a chair/moving?: Yes (uses Rollator) Do you have difficulty managing or taking your medications?: Yes (daughter manages)  Follow up appointments reviewed: PCP Follow-up appointment confirmed?: No Follow up with Greggory Stallion Osei-Bonsu in 1 week Specialty: Internal Medicine [facility] [phone] MD Provider Line Number:[phone] Given: No (Independent practice , Daughter  will call and schedule) Specialist Hospital Follow-up appointment confirmed?: Yes Date of Specialist follow-up appointment?: 02/11/24 Follow-Up Specialty Provider:: Oncology (labs and MD visit)  Surgical follow-up,  Fritzi Mandes Specialty: General Surgery We are  making a follow up appointment for you., Please call to confirm appointment time.,  [employer] [phone] they will arrange  Do you need transportation to your follow-up appointment?: No (Family transport) Do you understand care options if your condition(s) worsen?: Yes-patient verbalized understanding  SDOH Interventions Today    Flowsheet Row Most Recent Value  SDOH Interventions   Food Insecurity Interventions Intervention Not Indicated  Housing Interventions Intervention Not Indicated  Transportation Interventions Intervention Not Indicated, Patient Resources (Friends/Family)  Utilities Interventions Intervention Not Indicated      Interventions Today    Flowsheet Row Most Recent Value  Chronic Disease   Chronic disease during today's visit Hypertension (HTN), Other  [Constipation]  General Interventions   General Interventions Discussed/Reviewed General Interventions Discussed, General Interventions Reviewed, Labs, Sick Day Rules, Communication with, Doctor Visits  Doctor Visits Discussed/Reviewed Doctor Visits Discussed, Doctor Visits Reviewed, PCP, Specialist  PCP/Specialist Visits Compliance with follow-up visit  Communication with PCP/Specialists, RN  Exercise Interventions   Exercise Discussed/Reviewed Exercise Reviewed, Physical Activity  Physical Activity Discussed/Reviewed Physical Activity Reviewed, Physical Activity Discussed  Education Interventions   Education Provided Provided Education  Provided Verbal Education On Labs, Nutrition, Medication, When to see the doctor, Sick Day Rules, [employer], Air traffic controller, Other  [Medication, weight monitoring, edema PRN medication, B/P parameteres, wound care , Help at home ( PCS)]  Labs Reviewed --  [Stool for OB pending]  Mental Health Interventions   Mental Health Discussed/Reviewed Coping Strategies  Nutrition Interventions   Nutrition Discussed/Reviewed Nutrition Discussed, Nutrition  Reviewed  Pharmacy Interventions   Pharmacy Dicussed/Reviewed Pharmacy Topics Discussed, Pharmacy Topics Reviewed, Medications and their functions, Medication Adherence  Safety Interventions   Safety Discussed/Reviewed Fall Risk, Safety Reviewed        Benefits reviewed  Based on current information and Insurance plan -Reviewed benefits accessible to patient, including details about eligibility options for care and  available value based care options  if any areas of needs were identified.  Reviewed patient/  caregiver's ability to access and / or  ability with navigating the benefits system..Amb Referral made if indicted , refer to orders section of note for details   Reviewed goals for care  Patient / Caregiver was encouraged to make informed decisions about their care, actively participate in managing their health condition, and implement lifestyle changes as needed to promote independence and self-management of health care. There were no reported  barriers to care.   TOC program  Patient is at  risk for readmission and / or has history of  high utilization  Discussed VBCI  TOC program and weekly calls to patient to assess condition/status, medication management  and provide support/education as indicated . Patient  and / or Caregive voiced understanding and declined enrollment in the 30-day TOC Program at this time . She has a referral for [facility] Requested Providers SN-RN, T/OT and Soccial Work.  Spoke with patient and daughter She feels with the [place] services along with family support they do not need  the 30 day Program. Will defer to Home Health RN for Case Management  to avoid duplication of services 1 additional call scheduled or 02/06/24 to ensure outpatient services have started and she has a PCP follow-up visit scheduled Daughter will call and schedule.    Follow-up Plan 1.If not already completed- Please call scheduled a post hospital Follow up appointment  with  your  PCP within in 1-2 weeks of your hospital discharge date -when scheduling please ask your Primary MD to get all Hospital records sent to his/her office. Take all your medications and your discharge paperwork with you for your next visit with your Primary MD-   Be sure to request your Primary MD to go over all hospital tests and procedure/radiological results at the follow up appointment ,     Patient was encouraged to Contact PCP with any questions or concerns regarding ongoing medical care, any difficulty obtaining or picking up prescriptions, any changes or worsening in condition including signs / symptoms not relieved  with interventions Patient had no additional questions or concerns at this time.   2.Provided information on PCS program  3. Call placed to [facility] [phone], verified order for 3:1 BSC  will be delivered 4. Call placed to [facility] services will be initiated as requested also made them aware of reported Pressure injury on inner buttocks , Provided education of treatment options including barrier creams until seen by [facility] RN    Consent to share information   The Patient  / Caregiver is aware and gave permission for VBCI CM  to share personal information  including TOC engagement notes with other service providers in connection with care, including accessing and sharing medical, and if applicable, mental health records. The Patient / Caregiver also agreed to a referral ( if indicated) being requested in order to support their identified needs.      Susa Loffler , BSN, RN [facility]   VBCI-Population Health RN Care Manager Direct Dial [phone]  Fax: [phone] Website: [email or web address]

## 2024-02-05 ENCOUNTER — Other Ambulatory Visit: Payer: Self-pay

## 2024-02-05 DIAGNOSIS — I119 Hypertensive heart disease without heart failure: Secondary | ICD-10-CM | POA: Diagnosis not present

## 2024-02-05 DIAGNOSIS — E7211 Homocystinuria: Secondary | ICD-10-CM | POA: Diagnosis not present

## 2024-02-05 DIAGNOSIS — E1142 Type 2 diabetes mellitus with diabetic polyneuropathy: Secondary | ICD-10-CM | POA: Diagnosis not present

## 2024-02-05 DIAGNOSIS — G250 Essential tremor: Secondary | ICD-10-CM | POA: Diagnosis not present

## 2024-02-05 DIAGNOSIS — E039 Hypothyroidism, unspecified: Secondary | ICD-10-CM | POA: Diagnosis not present

## 2024-02-05 DIAGNOSIS — E782 Mixed hyperlipidemia: Secondary | ICD-10-CM | POA: Diagnosis not present

## 2024-02-05 DIAGNOSIS — K219 Gastro-esophageal reflux disease without esophagitis: Secondary | ICD-10-CM | POA: Diagnosis not present

## 2024-02-05 DIAGNOSIS — N1831 Chronic kidney disease, stage 3a: Secondary | ICD-10-CM | POA: Diagnosis not present

## 2024-02-05 DIAGNOSIS — H1013 Acute atopic conjunctivitis, bilateral: Secondary | ICD-10-CM | POA: Diagnosis not present

## 2024-02-05 DIAGNOSIS — C182 Malignant neoplasm of ascending colon: Secondary | ICD-10-CM | POA: Diagnosis not present

## 2024-02-05 DIAGNOSIS — E1122 Type 2 diabetes mellitus with diabetic chronic kidney disease: Secondary | ICD-10-CM | POA: Diagnosis not present

## 2024-02-05 NOTE — Progress Notes (Signed)
 02/05/2024 Name: Tahra Hitzeman MRN: [id number] DOB: September 25, 1936  Chief Complaint  Patient presents with   Medication Management    Jolin Benavides is a 88 y.o. year old female who was referred for medication management by their primary care provider, Jackie Plum, MD. They presented for a face to face visit today.   They were referred to the pharmacist by their PCP for assistance in managing complex medication management    Subjective: Patient sent to pharmacist by PCP for medication review post-discharge. She is with her daughter whom she lives with and who helps manages her medications.   Care Team: Primary Care Provider: Jackie Plum, MD ; Next Scheduled Visit: 03/18/2024   Medication Access/Adherence  Current Pharmacy:  [facility] [address] [place] [address] Phone: [phone] Fax: [phone]  [id number] [place], [place] - [address] AT [address] [place] [address] Phone: [phone] Fax: [phone]   Patient reports affordability concerns with their medications: Yes  Patient reports access/transportation concerns to their pharmacy: No  Patient reports adherence concerns with their medications:  No  Daughter Sherry Ruffing patient with medications, and picks them up from the pharmacy    Medication Management:  Current adherence strategy: Patient's daughter picks up medications and ensures patient is taking correctly.   Reports financial barriers to some of the over-the-counter medications that the patient takes.  Also reports SDOH needs, patient received referral from [facility] for nursing, physical therapy, occupational therapy and social work.    Objective:  Lab Results  Component Value Date   HGBA1C 5.7 (H) 01/18/2024    Lab Results  Component Value Date   CREATININE 1.12 (H) 01/29/2024   BUN 28 (H) 01/29/2024   NA 142 01/29/2024   K 4.3 01/29/2024   CL 108  01/29/2024   CO2 26 01/29/2024    Lab Results  Component Value Date   CHOL 216 (H) 07/11/2014   HDL 49 07/11/2014   LDLCALC 140 (H) 07/11/2014   TRIG 129 01/28/2024   CHOLHDL 4.4 07/11/2014    Medications Reviewed Today     Reviewed by Harlon Flor, [facility] (Pharmacist) on 02/05/24 at 1211  Med List Status: <None>   Medication Order Taking? Sig Documenting Provider Last Dose Status Informant  acetaminophen (TYLENOL) 325 MG tablet [id number] Yes Take 325 mg by mouth as needed for moderate pain (pain score 4-6), headache or mild pain (pain score 1-3).  Patient taking differently: Take 500 mg by mouth daily as needed for moderate pain (pain score 4-6), headache or mild pain (pain score 1-3).   [provider] Taking Active Child, Pharmacy Records  amLODipine (NORVASC) 5 MG tablet [id number] Yes Take 5 mg by mouth at bedtime. [provider] Taking Active Child, Pharmacy Records  anagrelide (AGRYLIN) 0.5 MG capsule [id number] Yes Take 2 capsules (1 mg) by mouth 2 times daily. Serena Croissant, MD Taking Active Child, Pharmacy Records  folic acid (FOLVITE) 1 MG tablet [id number] Yes Take 1 mg by mouth daily. [provider] Taking Active Child, Pharmacy Records  furosemide (LASIX) 20 MG tablet [id number] Yes Take 1 tablet (20 mg total) by mouth as needed.  Patient taking differently: Take 20 mg by mouth as needed for fluid or edema.   Elder Negus, MD Taking Active Child, Pharmacy Records           Med Note Colvin Caroli, Toma Copier D   Tue Feb 05, 2024 11:51 AM) Pt hasn't needed  it in the last month  gabapentin (NEURONTIN) 300 MG capsule [id number] Yes Take 300 mg by mouth daily as needed (burning). [provider] Taking Active Child, Pharmacy Records           Med Note (CRUTHIS, CHLOE C   Fri Jan 18, 2024  9:01 AM) Daughter is unsure of last dose.   hydrochlorothiazide (HYDRODIURIL) 12.5 MG tablet [id number] Yes Take 12.5 mg by mouth daily as needed (high BP).  [provider] Taking Active Child, Pharmacy Records           Med Note (CRUTHIS, CHLOE C   Fri Jan 18, 2024  9:02 AM) Daughter is adamant the pt is using this medication PRN for high BP   latanoprost (XALATAN) 0.005 % ophthalmic solution [id number] Yes Place 1 drop into both eyes at bedtime. [provider] Taking Active Child, Pharmacy Records  levothyroxine (SYNTHROID) 100 MCG tablet [id number] Yes Take 1 tablet (100 mcg total) by mouth daily before breakfast. Reuben Likes, MD Taking Active Child, Pharmacy Records  losartan (COZAAR) 50 MG tablet [id number] Yes Take 1 tablet (50 mg total) by mouth daily. Elder Negus, MD Taking Active Child, Pharmacy Records  metFORMIN (GLUCOPHAGE) 500 MG tablet [id number] Yes Take 250 mg by mouth 2 (two) times daily. [provider] Taking Active Child, Pharmacy Records           Med Note (CRUTHIS, CHLOE C   Fri Jan 18, 2024  9:02 AM) Daughter is adamant this is how the pt is taking this medication.   mirtazapine (REMERON) 30 MG tablet [id number] Yes Take 30 mg by mouth at bedtime. [provider] Taking Active Child, Pharmacy Records  nitroGLYCERIN (NITROSTAT) 0.4 MG SL tablet [id number] Yes Place 1 tablet (0.4 mg total) under the tongue every 5 (five) minutes as needed for chest pain. Elder Negus, MD Taking Active Child, Pharmacy Records           Med Note (CRUTHIS, CHLOE C   Fri Jan 18, 2024  9:03 AM) Last dose is unknown   omeprazole (PRILOSEC) 20 MG capsule [id number] Yes TAKE ONE CAPSULE BY MOUTH EVERY DAY Patwardhan, Anabel Bene, MD Taking Active Child, Pharmacy Records  Polyethyl Glycol-Propyl Glycol [facility] OP) [id number] Yes Place 1 drop into both eyes as needed (irritation). [provider] Taking Active Child, Pharmacy Records           Med Note Colvin Caroli, Toma Copier D   Tue Feb 05, 2024 11:57 AM) Only uses with irritation from latanoprost   polyethylene glycol (MIRALAX) 17 g packet [id number] Yes Take  17 g by mouth daily as needed for up to 7 days for moderate constipation or severe constipation. Eric Form, PA-C Taking Active   potassium chloride SA (KLOR-CON) 20 MEQ tablet [id number] Yes TAKE 1 TABLET(20 MEQ) BY MOUTH DAILY WITH LASIX AND FUROSEMIDE AS NEEDED  Patient taking differently: Take 20 mEq by mouth daily as needed (when taking lasix).   Patwardhan, Anabel Bene, MD Taking Active Child, Pharmacy Records  propranolol (INDERAL) 10 MG tablet [id number] Yes Take 5 mg by mouth 3 (three) times daily. [provider] Taking Active Child, Pharmacy Records  rosuvastatin (CRESTOR) 10 MG tablet [id number] Yes Take 10 mg by mouth daily. [provider] Taking Active   senna (SENOKOT) 8.6 MG TABS tablet [id number] No Take 2 tablets (17.2 mg total) by mouth daily as needed for moderate constipation.  Patient not taking: Reported on 02/05/2024   Elease Etienne, MD  Not Taking Active Child, Pharmacy Records           Med Note (CRUTHIS, CHLOE C   Fri Jan 18, 2024  9:03 AM) Daughter is unsure of last dose.   vitamin B-12 (CYANOCOBALAMIN) 1000 MCG tablet [id number] Yes Take 1,000 mcg by mouth daily. [provider] Taking Active Child, Pharmacy Records              Assessment/Plan:   Medication Management: - Currently strategy sufficient to maintain appropriate adherence to prescribed medication regimen - Called [facility] to verify which services are being provided and ensure patient's needs are being met, she will receive their services for 60 days.  - Performed benefits investigation to see if patient's plan offered OTC benefits, according to their customer service representative, they do not offer OTC benefits that would cover the GI medications the patient's daughter was inquiring on, simethicone, and Tums - Patient discussed with PCP bringing medication bottles to next visit for more comprehensive medication review    Follow Up Plan: Will follow-up in 6 weeks  after next PCP visit.  Harlon Flor, PharmD Clinical Pharmacist  [phone]

## 2024-02-06 ENCOUNTER — Other Ambulatory Visit: Payer: Self-pay

## 2024-02-06 ENCOUNTER — Telehealth: Payer: Self-pay

## 2024-02-06 NOTE — Patient Outreach (Signed)
 Care Management  Transitions of Care Program Transitions of Care Post-discharge 1 Additional follow-up call  02/06/2024 Name: [name] MRN: [id number] DOB: 09-14-1936  Subjective: [name] is a 88 y.o. year old female who is a primary care patient of Osei-Bonsu, Greggory Stallion, MD. The Care Management team was unable to reach the patient by phone to assess and address transitions of care needs.   Plan:   Patient  Outreach attempt is in the course of  [facility]  30-day TOC program. Pt previously agreed to an additional follow-up call due to potential risk for readmission and/or high utilization. Unfortunately, I was not able to speak with the patient in regards to recent hospital discharge    Patient's voicemail has  a generic greeting. To maintain HIPAA compliance, left message including only [facility] CM contact information and a request for a call back .   Additional outreach attempts will be made to reach the patient enrolled in the [facility] Program (Post Inpatient/ED Visit).  Susa Loffler , BSN, RN [facility]   VBCI-Population Health RN Care Manager Direct Dial [phone]  Fax: [phone] Website: [email or web address]

## 2024-02-06 NOTE — Transitions of Care (Post Inpatient/ED Visit) (Addendum)
 02/06/2024  Name: [name] MRN: [id number] DOB: 22-Jun-1936  Today's TOC FU Call Status:   Patient's Name and Date of Birth confirmed.  Transition Care Management Follow-up Telephone Call      Medications Reviewed Today: Medications Reviewed Today     Reviewed by Johnnette Barrios, RN (Registered Nurse) on 02/06/24 at 1312  Med List Status: <None>   Medication Order Taking? Sig Documenting Provider Last Dose Status Informant  acetaminophen (TYLENOL) 325 MG tablet [id number] Yes Take 325 mg by mouth as needed for moderate pain (pain score 4-6), headache or mild pain (pain score 1-3).  Patient taking differently: Take 500 mg by mouth daily as needed for moderate pain (pain score 4-6), headache or mild pain (pain score 1-3).   [provider] Taking Active Child, Pharmacy Records  amLODipine (NORVASC) 5 MG tablet [id number] Yes Take 5 mg by mouth at bedtime. [provider] Taking Active Child, Pharmacy Records  anagrelide (AGRYLIN) 0.5 MG capsule [id number] Yes Take 2 capsules (1 mg) by mouth 2 times daily. Serena Croissant, MD Taking Active Child, Pharmacy Records  folic acid (FOLVITE) 1 MG tablet [id number] Yes Take 1 mg by mouth daily. [provider] Taking Active Child, Pharmacy Records  furosemide (LASIX) 20 MG tablet [id number] Yes Take 1 tablet (20 mg total) by mouth as needed.  Patient taking differently: Take 20 mg by mouth as needed for fluid or edema.   Elder Negus, MD Taking Active Child, Pharmacy Records           Med Note Colvin Caroli, Toma Copier D   Tue Feb 05, 2024 11:51 AM) Pt hasn't needed it in the last month  gabapentin (NEURONTIN) 300 MG capsule [id number] Yes Take 300 mg by mouth daily as needed (burning). [provider] Taking Active Child, Pharmacy Records           Med Note (CRUTHIS, CHLOE C   Fri Jan 18, 2024  9:01 AM) Daughter is unsure of last dose.   hydrochlorothiazide (HYDRODIURIL) 12.5 MG tablet [id number] Yes Take 12.5 mg by  mouth daily as needed (high BP). [provider] Taking Active Child, Pharmacy Records           Med Note (CRUTHIS, CHLOE C   Fri Jan 18, 2024  9:02 AM) Daughter is adamant the pt is using this medication PRN for high BP   latanoprost (XALATAN) 0.005 % ophthalmic solution [id number] Yes Place 1 drop into both eyes at bedtime. [provider] Taking Active Child, Pharmacy Records  levothyroxine (SYNTHROID) 100 MCG tablet [id number] Yes Take 1 tablet (100 mcg total) by mouth daily before breakfast. Reuben Likes, MD Taking Active Child, Pharmacy Records  losartan (COZAAR) 50 MG tablet [id number] Yes Take 1 tablet (50 mg total) by mouth daily. Elder Negus, MD Taking Active Child, Pharmacy Records  metFORMIN (GLUCOPHAGE) 500 MG tablet [id number] Yes Take 250 mg by mouth 2 (two) times daily. [provider] Taking Active Child, Pharmacy Records           Med Note (CRUTHIS, CHLOE C   Fri Jan 18, 2024  9:02 AM) Daughter is adamant this is how the pt is taking this medication.   mirtazapine (REMERON) 30 MG tablet [id number] Yes Take 30 mg by mouth at bedtime. [provider] Taking Active Child, Pharmacy Records  nitroGLYCERIN (NITROSTAT) 0.4 MG SL tablet [id number] Yes Place 1 tablet (0.4 mg total) under the tongue every 5 (five) minutes as needed for chest pain. Patwardhan, Manish  J, MD Taking Active Child, Pharmacy Records           Med Note (CRUTHIS, CHLOE C   Fri Jan 18, 2024  9:03 AM) Last dose is unknown   omeprazole (PRILOSEC) 20 MG capsule [id number] Yes TAKE ONE CAPSULE BY MOUTH EVERY DAY Patwardhan, Anabel Bene, MD Taking Active Child, Pharmacy Records  Polyethyl Glycol-Propyl Glycol [facility] OP) [id number] Yes Place 1 drop into both eyes as needed (irritation). [provider] Taking Active Child, Pharmacy Records           Med Note Colvin Caroli, Toma Copier D   Tue Feb 05, 2024 11:57 AM) Only uses with irritation from latanoprost   polyethylene glycol  (MIRALAX) 17 g packet [id number] Yes Take 17 g by mouth daily as needed for up to 7 days for moderate constipation or severe constipation. Eric Form, PA-C Taking Active   potassium chloride SA (KLOR-CON) 20 MEQ tablet [id number] Yes TAKE 1 TABLET(20 MEQ) BY MOUTH DAILY WITH LASIX AND FUROSEMIDE AS NEEDED  Patient taking differently: Take 20 mEq by mouth daily as needed (when taking lasix).   Patwardhan, Anabel Bene, MD Taking Active Child, Pharmacy Records  propranolol (INDERAL) 10 MG tablet [id number] Yes Take 5 mg by mouth 3 (three) times daily. [provider] Taking Active Child, Pharmacy Records  rosuvastatin (CRESTOR) 10 MG tablet [id number] Yes Take 10 mg by mouth daily. [provider] Taking Active   senna (SENOKOT) 8.6 MG TABS tablet [id number] Yes Take 2 tablets (17.2 mg total) by mouth daily as needed for moderate constipation. Elease Etienne, MD Taking Active Child, Pharmacy Records           Med Note (CRUTHIS, CHLOE C   Fri Jan 18, 2024  9:03 AM) Daughter is unsure of last dose.   vitamin B-12 (CYANOCOBALAMIN) 1000 MCG tablet [id number] Yes Take 1,000 mcg by mouth daily. [provider] Taking Active Child, Pharmacy Records           Medication reconciliation / review completed based on most recent discharge summary and EHR medication list. Confirmed patient is taking all newly prescribed medications as instructed (any discrepancies are noted in review section)    Caregiver denies questions at this time and reports no barriers to medication adherence.   Items Reviewed: Had medication review with Pharmacists 02/05/24 and also PCP She has had a review with Frances Furbish SW RN, PT/OT  She expressed concerns that there were multiple disciplines visiting and "no one " has helped yet. Explained SOC process and the need for initial assessments . Reviewed contact info and Clinical Manager role and encouraged her to touch base with the [facility] CM to review concerns . She  is waiting for a call regarding ongoing plan and visit schedule. She is hoping to get some adaptive  equipment.  Reviewed several options for DME , MCR and MA coverage and some limitations  They are ok with medications and no report  barriers at this time   Patient was encouraged to Contact PCP with any questions or concerns regarding ongoing medical care, any difficulty obtaining or picking up prescriptions, any changes or worsening in condition including signs / symptoms not relieved  with interventions   The patient has been provided with contact information for the care management team and has been advised to call with any health-related questions or concerns.  Susa Loffler , BSN, RN [facility]   VBCI-Population Health RN Care Manager Direct Dial [phone]  Fax: [phone] Website: [email or web address]

## 2024-02-11 ENCOUNTER — Encounter: Payer: Self-pay | Admitting: Hematology and Oncology

## 2024-02-11 ENCOUNTER — Inpatient Hospital Stay

## 2024-02-11 ENCOUNTER — Encounter (HOSPITAL_COMMUNITY): Payer: Self-pay | Admitting: Emergency Medicine

## 2024-02-11 ENCOUNTER — Inpatient Hospital Stay: Payer: Medicare Other

## 2024-02-11 ENCOUNTER — Inpatient Hospital Stay (HOSPITAL_COMMUNITY)
Admission: EM | Admit: 2024-02-11 | Discharge: 2024-02-13 | DRG: 863 | Disposition: A | Discharge status: Home Health | Attending: Surgery | Admitting: Surgery

## 2024-02-11 ENCOUNTER — Inpatient Hospital Stay: Payer: Medicare Other | Admitting: Hematology and Oncology

## 2024-02-11 ENCOUNTER — Emergency Department (HOSPITAL_COMMUNITY)

## 2024-02-11 ENCOUNTER — Inpatient Hospital Stay: Admitting: Hematology

## 2024-02-11 ENCOUNTER — Observation Stay (HOSPITAL_COMMUNITY)

## 2024-02-11 DIAGNOSIS — I509 Heart failure, unspecified: Secondary | ICD-10-CM | POA: Diagnosis present

## 2024-02-11 DIAGNOSIS — T8141XA Infection following a procedure, superficial incisional surgical site, initial encounter: Principal | ICD-10-CM | POA: Diagnosis present

## 2024-02-11 DIAGNOSIS — J984 Other disorders of lung: Secondary | ICD-10-CM | POA: Diagnosis not present

## 2024-02-11 DIAGNOSIS — D3501 Benign neoplasm of right adrenal gland: Secondary | ICD-10-CM | POA: Diagnosis not present

## 2024-02-11 DIAGNOSIS — L089 Local infection of the skin and subcutaneous tissue, unspecified: Secondary | ICD-10-CM | POA: Diagnosis not present

## 2024-02-11 DIAGNOSIS — L03818 Cellulitis of other sites: Secondary | ICD-10-CM | POA: Diagnosis present

## 2024-02-11 DIAGNOSIS — Z8249 Family history of ischemic heart disease and other diseases of the circulatory system: Secondary | ICD-10-CM

## 2024-02-11 DIAGNOSIS — L03311 Cellulitis of abdominal wall: Secondary | ICD-10-CM | POA: Diagnosis present

## 2024-02-11 DIAGNOSIS — C180 Malignant neoplasm of cecum: Secondary | ICD-10-CM | POA: Diagnosis not present

## 2024-02-11 DIAGNOSIS — C189 Malignant neoplasm of colon, unspecified: Secondary | ICD-10-CM | POA: Diagnosis present

## 2024-02-11 DIAGNOSIS — E039 Hypothyroidism, unspecified: Secondary | ICD-10-CM | POA: Diagnosis present

## 2024-02-11 DIAGNOSIS — Z91013 Allergy to seafood: Secondary | ICD-10-CM

## 2024-02-11 DIAGNOSIS — Z880 Allergy status to penicillin: Secondary | ICD-10-CM

## 2024-02-11 DIAGNOSIS — I130 Hypertensive heart and chronic kidney disease with heart failure and stage 1 through stage 4 chronic kidney disease, or unspecified chronic kidney disease: Secondary | ICD-10-CM | POA: Diagnosis present

## 2024-02-11 DIAGNOSIS — Z79890 Hormone replacement therapy (postmenopausal): Secondary | ICD-10-CM

## 2024-02-11 DIAGNOSIS — Y838 Other surgical procedures as the cause of abnormal reaction of the patient, or of later complication, without mention of misadventure at the time of the procedure: Secondary | ICD-10-CM | POA: Diagnosis present

## 2024-02-11 DIAGNOSIS — T148XXA Other injury of unspecified body region, initial encounter: Secondary | ICD-10-CM | POA: Diagnosis not present

## 2024-02-11 DIAGNOSIS — Z79899 Other long term (current) drug therapy: Secondary | ICD-10-CM

## 2024-02-11 DIAGNOSIS — N1831 Chronic kidney disease, stage 3a: Secondary | ICD-10-CM | POA: Diagnosis present

## 2024-02-11 DIAGNOSIS — H409 Unspecified glaucoma: Secondary | ICD-10-CM | POA: Diagnosis present

## 2024-02-11 DIAGNOSIS — E1122 Type 2 diabetes mellitus with diabetic chronic kidney disease: Secondary | ICD-10-CM | POA: Diagnosis present

## 2024-02-11 DIAGNOSIS — E876 Hypokalemia: Secondary | ICD-10-CM | POA: Diagnosis present

## 2024-02-11 DIAGNOSIS — Z7984 Long term (current) use of oral hypoglycemic drugs: Secondary | ICD-10-CM

## 2024-02-11 DIAGNOSIS — R918 Other nonspecific abnormal finding of lung field: Secondary | ICD-10-CM | POA: Diagnosis not present

## 2024-02-11 DIAGNOSIS — K439 Ventral hernia without obstruction or gangrene: Secondary | ICD-10-CM | POA: Diagnosis present

## 2024-02-11 DIAGNOSIS — N179 Acute kidney failure, unspecified: Secondary | ICD-10-CM | POA: Diagnosis present

## 2024-02-11 DIAGNOSIS — K219 Gastro-esophageal reflux disease without esophagitis: Secondary | ICD-10-CM | POA: Diagnosis present

## 2024-02-11 DIAGNOSIS — E785 Hyperlipidemia, unspecified: Secondary | ICD-10-CM | POA: Diagnosis present

## 2024-02-11 DIAGNOSIS — D7389 Other diseases of spleen: Secondary | ICD-10-CM | POA: Diagnosis not present

## 2024-02-11 DIAGNOSIS — I517 Cardiomegaly: Secondary | ICD-10-CM | POA: Diagnosis not present

## 2024-02-11 LAB — BASIC METABOLIC PANEL WITH GFR
Anion gap: 11 (ref 5–15)
BUN: 15 mg/dL (ref 8–23)
CO2: 27 mmol/L (ref 22–32)
Calcium: 9.2 mg/dL (ref 8.9–10.3)
Chloride: 104 mmol/L (ref 98–111)
Creatinine, Ser: 1.12 mg/dL — ABNORMAL HIGH (ref 0.44–1.00)
GFR, Estimated: 48 mL/min — ABNORMAL LOW (ref >60)
Glucose, Bld: 89 mg/dL (ref 70–99)
Potassium: 3.4 mmol/L — ABNORMAL LOW (ref 3.5–5.1)
Sodium: 142 mmol/L (ref 135–145)

## 2024-02-11 LAB — URINALYSIS, W/ REFLEX TO CULTURE (INFECTION SUSPECTED)
Bacteria, UA: NONE SEEN
Bilirubin Urine: NEGATIVE
Glucose, UA: NEGATIVE mg/dL
Hgb urine dipstick: NEGATIVE
Ketones, ur: NEGATIVE mg/dL
Nitrite: NEGATIVE
Protein, ur: NEGATIVE mg/dL
Specific Gravity, Urine: 1.014 (ref 1.005–1.030)
pH: 6 (ref 5.0–8.0)

## 2024-02-11 LAB — COMPREHENSIVE METABOLIC PANEL WITH GFR
ALT: 13 U/L (ref 0–44)
AST: 23 U/L (ref 15–41)
Albumin: 2.7 g/dL — ABNORMAL LOW (ref 3.5–5.0)
Alkaline Phosphatase: 67 U/L (ref 38–126)
Anion gap: 11 (ref 5–15)
BUN: 15 mg/dL (ref 8–23)
CO2: 28 mmol/L (ref 22–32)
Calcium: 9.2 mg/dL (ref 8.9–10.3)
Chloride: 102 mmol/L (ref 98–111)
Creatinine, Ser: 1.19 mg/dL — ABNORMAL HIGH (ref 0.44–1.00)
GFR, Estimated: 45 mL/min — ABNORMAL LOW (ref >60)
Glucose, Bld: 99 mg/dL (ref 70–99)
Potassium: 3.1 mmol/L — ABNORMAL LOW (ref 3.5–5.1)
Sodium: 141 mmol/L (ref 135–145)
Total Bilirubin: 0.6 mg/dL (ref 0.0–1.2)
Total Protein: 7.5 g/dL (ref 6.5–8.1)

## 2024-02-11 LAB — CBC WITH DIFFERENTIAL/PLATELET
Abs Immature Granulocytes: 0.04 10*3/uL (ref 0.00–0.07)
Basophils Absolute: 0 10*3/uL (ref 0.0–0.1)
Basophils Relative: 1 %
Eosinophils Absolute: 0.3 10*3/uL (ref 0.0–0.5)
Eosinophils Relative: 5 %
HCT: 39.6 % (ref 36.0–46.0)
Hemoglobin: 12.4 g/dL (ref 12.0–15.0)
Immature Granulocytes: 1 %
Lymphocytes Relative: 19 %
Lymphs Abs: 1.2 10*3/uL (ref 0.7–4.0)
MCH: 26.1 pg (ref 26.0–34.0)
MCHC: 31.3 g/dL (ref 30.0–36.0)
MCV: 83.2 fL (ref 80.0–100.0)
Monocytes Absolute: 0.7 10*3/uL (ref 0.1–1.0)
Monocytes Relative: 10 %
Neutro Abs: 4.2 10*3/uL (ref 1.7–7.7)
Neutrophils Relative %: 64 %
Platelets: 207 10*3/uL (ref 150–400)
RBC: 4.76 MIL/uL (ref 3.87–5.11)
RDW: 18.5 % — ABNORMAL HIGH (ref 11.5–15.5)
WBC: 6.4 10*3/uL (ref 4.0–10.5)
nRBC: 0 % (ref 0.0–0.2)

## 2024-02-11 LAB — CBC
HCT: 30.7 % — ABNORMAL LOW (ref 36.0–46.0)
Hemoglobin: 9.7 g/dL — ABNORMAL LOW (ref 12.0–15.0)
MCH: 26.2 pg (ref 26.0–34.0)
MCHC: 31.6 g/dL (ref 30.0–36.0)
MCV: 83 fL (ref 80.0–100.0)
Platelets: 253 10*3/uL (ref 150–400)
RBC: 3.7 MIL/uL — ABNORMAL LOW (ref 3.87–5.11)
RDW: 18.2 % — ABNORMAL HIGH (ref 11.5–15.5)
WBC: 6.9 10*3/uL (ref 4.0–10.5)
nRBC: 0 % (ref 0.0–0.2)

## 2024-02-11 LAB — I-STAT CG4 LACTIC ACID, ED: Lactic Acid, Venous: 0.8 mmol/L (ref 0.5–1.9)

## 2024-02-11 LAB — GLUCOSE, CAPILLARY
Glucose-Capillary: 90 mg/dL (ref 70–99)
Glucose-Capillary: 75 mg/dL (ref 70–99)
Glucose-Capillary: 92 mg/dL (ref 70–99)
Glucose-Capillary: 110 mg/dL — ABNORMAL HIGH (ref 70–99)

## 2024-02-11 LAB — PROTIME-INR
INR: 1 (ref 0.8–1.2)
Prothrombin Time: 13.8 s (ref 11.4–15.2)

## 2024-02-11 MED ORDER — TRAMADOL HCL 50 MG PO TABS
50.0000 mg | ORAL_TABLET | Freq: Four times a day (QID) | ORAL | Status: DC | PRN
  Administered 2024-02-11 – 2024-02-13 (×4): 50 mg via ORAL
  Filled 2024-02-11 (×4): qty 1

## 2024-02-11 MED ORDER — LATANOPROST 0.005 % OP SOLN
1.0000 [drp] | Freq: Every day | OPHTHALMIC | Status: DC
  Administered 2024-02-11 – 2024-02-12 (×2): 1 [drp] via OPHTHALMIC
  Filled 2024-02-11: qty 2.5

## 2024-02-11 MED ORDER — ENOXAPARIN SODIUM 30 MG/0.3ML IJ SOSY
30.0000 mg | PREFILLED_SYRINGE | INTRAMUSCULAR | Status: DC
  Administered 2024-02-11 – 2024-02-13 (×3): 30 mg via SUBCUTANEOUS
  Filled 2024-02-11 (×3): qty 0.3

## 2024-02-11 MED ORDER — FENTANYL CITRATE PF 50 MCG/ML IJ SOSY: 12.5000 ug | PREFILLED_SYRINGE | INTRAMUSCULAR | Status: DC | PRN

## 2024-02-11 MED ORDER — LOSARTAN POTASSIUM 50 MG PO TABS
50.0000 mg | ORAL_TABLET | Freq: Every day | ORAL | Status: DC
  Administered 2024-02-11 – 2024-02-13 (×3): 50 mg via ORAL
  Filled 2024-02-11 (×3): qty 1

## 2024-02-11 MED ORDER — DOCUSATE SODIUM 100 MG PO CAPS
100.0000 mg | ORAL_CAPSULE | Freq: Two times a day (BID) | ORAL | Status: DC
  Administered 2024-02-11 – 2024-02-13 (×5): 100 mg via ORAL
  Filled 2024-02-11 (×5): qty 1

## 2024-02-11 MED ORDER — ONDANSETRON 4 MG PO TBDP: 4.0000 mg | ORAL_TABLET | Freq: Four times a day (QID) | ORAL | Status: DC | PRN

## 2024-02-11 MED ORDER — AMLODIPINE BESYLATE 5 MG PO TABS
5.0000 mg | ORAL_TABLET | Freq: Every day | ORAL | Status: DC
  Administered 2024-02-11 – 2024-02-12 (×2): 5 mg via ORAL
  Filled 2024-02-11 (×2): qty 1

## 2024-02-11 MED ORDER — FUROSEMIDE 20 MG PO TABS
20.0000 mg | ORAL_TABLET | Freq: Once | ORAL | Status: AC
  Administered 2024-02-11: 20 mg via ORAL
  Filled 2024-02-11: qty 1

## 2024-02-11 MED ORDER — HYDROCODONE-ACETAMINOPHEN 5-325 MG PO TABS
1.0000 | ORAL_TABLET | Freq: Once | ORAL | Status: AC
  Administered 2024-02-11: 1 via ORAL
  Filled 2024-02-11: qty 1

## 2024-02-11 MED ORDER — SIMETHICONE 80 MG PO CHEW: 40.0000 mg | CHEWABLE_TABLET | Freq: Four times a day (QID) | ORAL | Status: DC | PRN

## 2024-02-11 MED ORDER — PROPRANOLOL HCL 10 MG PO TABS
5.0000 mg | ORAL_TABLET | Freq: Three times a day (TID) | ORAL | Status: DC
Start: 2024-02-11 — End: 2024-02-13
  Administered 2024-02-11 – 2024-02-13 (×7): 5 mg via ORAL
  Filled 2024-02-11 (×7): qty 0.5

## 2024-02-11 MED ORDER — BISACODYL 10 MG RE SUPP: 10.0000 mg | Freq: Every day | RECTAL | Status: DC | PRN

## 2024-02-11 MED ORDER — INSULIN ASPART 100 UNIT/ML IJ SOLN: 0.0000 [IU] | Freq: Every day | INTRAMUSCULAR | Status: DC

## 2024-02-11 MED ORDER — ACETAMINOPHEN 500 MG PO TABS
1000.0000 mg | ORAL_TABLET | Freq: Four times a day (QID) | ORAL | Status: DC
  Administered 2024-02-11 – 2024-02-13 (×8): 1000 mg via ORAL
  Filled 2024-02-11 (×8): qty 2

## 2024-02-11 MED ORDER — PANTOPRAZOLE SODIUM 40 MG PO TBEC
40.0000 mg | DELAYED_RELEASE_TABLET | Freq: Every day | ORAL | Status: DC
  Administered 2024-02-11 – 2024-02-13 (×3): 40 mg via ORAL
  Filled 2024-02-11 (×3): qty 1

## 2024-02-11 MED ORDER — ONDANSETRON HCL 4 MG/2ML IJ SOLN: 4.0000 mg | Freq: Four times a day (QID) | INTRAMUSCULAR | Status: DC | PRN

## 2024-02-11 MED ORDER — PIPERACILLIN-TAZOBACTAM 3.375 G IVPB
3.3750 g | Freq: Three times a day (TID) | INTRAVENOUS | Status: DC
  Administered 2024-02-11 – 2024-02-13 (×7): 3.375 g via INTRAVENOUS
  Filled 2024-02-11 (×7): qty 50

## 2024-02-11 MED ORDER — INSULIN ASPART 100 UNIT/ML IJ SOLN
0.0000 [IU] | Freq: Three times a day (TID) | INTRAMUSCULAR | Status: DC
  Administered 2024-02-12: 2 [IU] via SUBCUTANEOUS

## 2024-02-11 MED ORDER — POLYETHYLENE GLYCOL 3350 17 G PO PACK
17.0000 g | PACK | Freq: Two times a day (BID) | ORAL | Status: DC
  Administered 2024-02-11 – 2024-02-13 (×4): 17 g via ORAL
  Filled 2024-02-11 (×4): qty 1

## 2024-02-11 MED ORDER — LEVOTHYROXINE SODIUM 100 MCG PO TABS
100.0000 ug | ORAL_TABLET | Freq: Every day | ORAL | Status: DC
  Administered 2024-02-11 – 2024-02-13 (×3): 100 ug via ORAL
  Filled 2024-02-11 (×3): qty 1

## 2024-02-11 MED ORDER — MIRTAZAPINE 15 MG PO TABS
30.0000 mg | ORAL_TABLET | Freq: Every day | ORAL | Status: DC
  Administered 2024-02-11 – 2024-02-12 (×2): 30 mg via ORAL
  Filled 2024-02-11 (×2): qty 2

## 2024-02-11 MED ORDER — POTASSIUM CHLORIDE 20 MEQ PO PACK
60.0000 meq | PACK | Freq: Once | ORAL | Status: AC
  Administered 2024-02-11: 60 meq via ORAL
  Filled 2024-02-11: qty 3

## 2024-02-11 NOTE — ED Provider Notes (Signed)
 [place] [facility] AT [facility] Provider Note   CSN: [id number] Arrival date & time: 02/11/24  0042     History  Chief Complaint  Patient presents with   Wound Check    [name] is a 88 y.o. female.  The history is provided by the patient.  Wound Check This is a new problem. Associated symptoms include abdominal pain. Nothing relieves the symptoms.  Patient w/history of hypertension, CHF presents for a wound check.  Patient underwent a hemicolectomy on March 14.  Over the past several days she has had increasing pain, swelling redness and is now draining pus.  It is dramatically worsened over the past 24 hours.  She denies any fevers or vomiting.    Past Medical History:  Diagnosis Date   CHF (congestive heart failure) (HCC)    Hypertension    Pre-diabetes    Thrombocytosis    Thyroid disease     Home Medications Prior to Admission medications   Medication Sig Start Date End Date Taking? Authorizing Provider  acetaminophen (TYLENOL) 325 MG tablet Take 325 mg by mouth as needed for moderate pain (pain score 4-6), headache or mild pain (pain score 1-3). Patient taking differently: Take 500 mg by mouth daily as needed for moderate pain (pain score 4-6), headache or mild pain (pain score 1-3).    [provider]  amLODipine (NORVASC) 5 MG tablet Take 5 mg by mouth at bedtime.    [provider]  anagrelide (AGRYLIN) 0.5 MG capsule Take 2 capsules (1 mg) by mouth 2 times daily. 06/14/23   Serena Croissant, MD  folic acid (FOLVITE) 1 MG tablet Take 1 mg by mouth daily. 04/09/18   [provider]  furosemide (LASIX) 20 MG tablet Take 1 tablet (20 mg total) by mouth as needed. Patient taking differently: Take 20 mg by mouth as needed for fluid or edema. 06/22/23   Patwardhan, Anabel Bene, MD  gabapentin (NEURONTIN) 300 MG capsule Take 300 mg by mouth daily as needed (burning).    [provider]  hydrochlorothiazide (HYDRODIURIL)  12.5 MG tablet Take 12.5 mg by mouth daily as needed (high BP). 12/13/23   [provider]  latanoprost (XALATAN) 0.005 % ophthalmic solution Place 1 drop into both eyes at bedtime. 01/03/24 01/02/25  [provider]  levothyroxine (SYNTHROID) 100 MCG tablet Take 1 tablet (100 mcg total) by mouth daily before breakfast. 08/06/14   Reuben Likes, MD  losartan (COZAAR) 50 MG tablet Take 1 tablet (50 mg total) by mouth daily. 08/16/20   Patwardhan, Anabel Bene, MD  metFORMIN (GLUCOPHAGE) 500 MG tablet Take 250 mg by mouth 2 (two) times daily.    [provider]  mirtazapine (REMERON) 30 MG tablet Take 30 mg by mouth at bedtime. 01/12/24   [provider]  nitroGLYCERIN (NITROSTAT) 0.4 MG SL tablet Place 1 tablet (0.4 mg total) under the tongue every 5 (five) minutes as needed for chest pain. 08/16/20   Patwardhan, Anabel Bene, MD  omeprazole (PRILOSEC) 20 MG capsule TAKE ONE CAPSULE BY MOUTH EVERY DAY 07/22/19   Patwardhan, Anabel Bene, MD  Polyethyl Glycol-Propyl Glycol (SYSTANE OP) Place 1 drop into both eyes as needed (irritation).    [provider]  potassium chloride SA (KLOR-CON) 20 MEQ tablet TAKE 1 TABLET(20 MEQ) BY MOUTH DAILY WITH LASIX AND FUROSEMIDE AS NEEDED Patient taking differently: Take 20 mEq by mouth daily as needed (when taking lasix). 08/01/21   Patwardhan, Anabel Bene, MD  propranolol (INDERAL) 10 MG tablet Take 5 mg by mouth 3 (three) times daily. 11/21/23   [provider]  rosuvastatin (CRESTOR) 10 MG tablet Take 10 mg by mouth daily.    [provider]  senna (SENOKOT) 8.6 MG TABS tablet Take 2 tablets (17.2 mg total) by mouth daily as needed for moderate constipation. 07/13/14   Hongalgi, Maximino Greenland, MD  vitamin B-12 (CYANOCOBALAMIN) 1000 MCG tablet Take 1,000 mcg by mouth daily. 07/29/20   [provider]      Allergies    Penicillin g and Shellfish allergy    Review of Systems   Review of Systems  Constitutional:  Negative  for fever.  Gastrointestinal:  Positive for abdominal pain. Negative for vomiting.  Skin:  Positive for wound.    Physical Exam Updated Vital Signs BP (!) 154/99   Pulse 100   Temp 98 F (36.7 C) (Oral)   Resp 18   SpO2 95%  Physical Exam CONSTITUTIONAL: Elderly, no acute distress HEAD: Normocephalic/atraumatic ENMT: Mucous membranes moist NECK: supple no meningeal signs CV: S1/S2 noted LUNGS: Lungs are clear to auscultation bilaterally, no apparent distress ABDOMEN: soft, midline incision is firm to touch with tenderness and erythema.  No crepitus.  It is actively draining pus, see photo NEURO: Pt is awake/alert/appropriate, moves all extremitiesx4.  No facial droop.   EXTREMITIES: full ROM SKIN: warm,see photo PSYCH: no abnormalities of mood noted, alert and oriented to situation    ED Results / Procedures / Treatments   Labs (all labs ordered are listed, but only abnormal results are displayed) Labs Reviewed  CBC WITH DIFFERENTIAL/PLATELET - Abnormal; Notable for the following components:      Result Value   RDW 18.5 (*)    All other components within normal limits  CULTURE, BLOOD (ROUTINE X 2)  CULTURE, BLOOD (ROUTINE X 2)  PROTIME-INR  CBC WITH DIFFERENTIAL/PLATELET  URINALYSIS, W/ REFLEX TO CULTURE (INFECTION SUSPECTED)  COMPREHENSIVE METABOLIC PANEL WITH GFR  BASIC METABOLIC PANEL WITH GFR  CBC  I-STAT CG4 LACTIC ACID, ED    EKG None  Radiology DG Chest Portable 1 View Result Date: 02/11/2024 CLINICAL DATA:  Possible infection EXAM: PORTABLE CHEST 1 VIEW COMPARISON:  01/24/2024 FINDINGS: Cardiomegaly with possible small pleural effusions. Streaky bibasilar airspace disease. No pneumothorax IMPRESSION: Cardiomegaly with possible small pleural effusions. Streaky bibasilar airspace disease, atelectasis versus pneumonia. Electronically Signed   By: Jasmine Pang M.D.   On: 02/11/2024 01:30    Procedures Procedures    Medications Ordered in ED Medications   enoxaparin (LOVENOX) injection 30 mg (has no administration in time range)  acetaminophen (TYLENOL) tablet 1,000 mg (has no administration in time range)  traMADol (ULTRAM) tablet 50 mg (has no administration in time range)  fentaNYL (SUBLIMAZE) injection 12.5 mcg (has no administration in time range)  docusate sodium (COLACE) capsule 100 mg (has no administration in time range)  bisacodyl (DULCOLAX) suppository 10 mg (has no administration in time range)  ondansetron (ZOFRAN-ODT) disintegrating tablet 4 mg (has no administration in time range)    Or  ondansetron (ZOFRAN) injection 4 mg (has no administration in time range)  simethicone (MYLICON) chewable tablet 40 mg (has no administration in time range)  amLODipine (NORVASC) tablet 5 mg (has no administration in time range)  propranolol (INDERAL) tablet 5 mg (has no administration in time range)  pantoprazole (PROTONIX) EC tablet 40 mg (has no administration in time range)  mirtazapine (REMERON) tablet 30 mg (has no administration in time range)  losartan (COZAAR) tablet 50 mg (has no administration in time range)  levothyroxine (SYNTHROID) tablet 100 mcg (has no administration in time range)  latanoprost (XALATAN) 0.005 % ophthalmic solution 1 drop (has no administration in time range)  insulin aspart (novoLOG) injection 0-15 Units (has no administration in time range)  insulin aspart (novoLOG) injection 0-5 Units (has no administration in time range)  HYDROcodone-acetaminophen (NORCO/VICODIN) 5-325 MG per tablet 1 tablet (1 tablet Oral Given 02/11/24 0230)    ED Course/ Medical Decision Making/ A&P Clinical Course as of 02/11/24 0243  Mon Feb 11, 2024  0159 Patient presents with pain, swelling and drainage from abdominal incision from last month. Patient has obvious postoperative infection.  I have consulted general surgery Dr. Fredricka Bonine who will see the patient [DW]    Clinical Course User Index [DW] Zadie Rhine, MD                                  Medical Decision Making Amount and/or Complexity of Data Reviewed Labs: ordered. Radiology: ordered.  Risk Prescription drug management. Decision regarding hospitalization.   This patient presents to the ED for concern of wound check, this involves an extensive number of treatment options, and is a complaint that carries with it a high risk of complications and morbidity.  The differential diagnosis includes but is not limited to postoperative abscess, necrotizing fasciitis, cellulitis, postoperative hematoma  Comorbidities that complicate the patient evaluation: Patient's presentation is complicated by their history of hypertension  Additional history obtained: Additional history obtained from family Records reviewed previous admission documents  Lab Tests: I Ordered, and personally interpreted labs.  The pertinent results include:  no leukocytosis  Imaging Studies ordered: I ordered imaging studies including X-ray chest   I independently visualized and interpreted imaging which showed cardiomegaly I agree with the radiologist interpretation   Medicines ordered and prescription drug management: I ordered medication including Vicodin for pain   Critical Interventions:  admission to general surgery  Consultations Obtained: I requested consultation with the admitting physician surgery , and discussed  findings as well as pertinent plan - they recommend: will admit  Reevaluation: After the interventions noted above, I reevaluated the patient and found that they have :stayed the same  Complexity of problems addressed: Patient's presentation is most consistent with  acute presentation with potential threat to life or bodily function  Disposition: After consideration of the diagnostic results and the patient's response to treatment,  I feel that the patent would benefit from admission   .    2:44 AM Pt to be admitted to general surgery  service        Final Clinical Impression(s) / ED Diagnoses Final diagnoses:  Wound infection    Rx / DC Orders ED Discharge Orders     None         Zadie Rhine, MD 02/11/24 [phone]

## 2024-02-11 NOTE — Progress Notes (Signed)
 Transition of Care [facility]) - Inpatient Brief Assessment   Patient Details  Name: [name] MRN: [id number] Date of Birth: [birth year]  Transition of Care [facility]) CM/SW Contact:    Amanda Bridgeman, RN Phone Number: 02/11/2024, 3:47 PM   Clinical Narrative: CM met with the patient at the bedside to discuss TOC needs.  The patient lives with family and admitted to the hospital with an abdominal wound infection.  Patient plans to return home with home health when stable for discharge.  Patient is currently active with [facility].  [place] letter was provided to the patient at the bedside.  No other TOC needs at this time.   Transition of Care Asessment: Insurance and Status: (P) Insurance coverage has been reviewed Patient has primary care physician: (P) Yes Home environment has been reviewed: (P) from home with family Prior level of function:: (P) RW Prior/Current Home Services: (P) Current home services (Active with [facility]) Social Drivers of Health Review: (P) SDOH reviewed interventions complete Readmission risk has been reviewed: (P) Yes Transition of care needs: (P) transition of care needs identified, TOC will continue to follow

## 2024-02-11 NOTE — Plan of Care (Signed)
   Problem: Education: Goal: Ability to describe self-care measures that may prevent or decrease complications (Diabetes Survival Skills Education) will improve Outcome: Progressing

## 2024-02-11 NOTE — Progress Notes (Signed)
 Subjective: CC: Reviewed hx w/ pt and daughter at bedside.   S/p Open right hemicolectomy with ileocolic anastomosis by Dr. 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 Seen by Dr. [name] during last admission. They plan for outpt follow up. Was scheduled for f/u today.   Was d/c'd 3/26. Was living at home w/ her daughter at discharge. Was tolerating soft food with early satiety but slowly getting her appetite back. Was having loose BM's every other day on daily fiber and miralax.   Noted some erythema of her wound last week. On 4/4 they noted swelling/firmness at the upper aspect of the incision. Yesterday noted purulent drainage over the last 24 hours.   Currently has no abdominal pain. Tolerating liquids without n/v. Last BM 4/6.   Objective: Vital signs in last 24 hours: Temp:  [98 F (36.7 C)-98.7 F (37.1 C)] 98.7 F (37.1 C) (04/07 0754) Pulse Rate:  [78-100] 78 (04/07 0754) Resp:  [16-18] 16 (04/07 0754) BP: (131-154)/(75-99) 137/78 (04/07 0754) SpO2:  [94 %-95 %] 94 % (04/07 0754)    Intake/Output from previous day: No intake/output data recorded. Intake/Output this shift: No intake/output data recorded.  PE: Gen:  Alert, NAD, pleasant Card:  Reg Pulm:  Rate and effort normal Abd: Soft, mild distension, NT except over midline wound, +BS. Midline wound 6cm x 7cm area of erythema, heat and induration along the superior aspect. Induration is more along the R lateral portion of midline wound. There is a 1cm opening along midline wound, centrally in the area of erythema that has purulent drainage. This tracks 3cm superiorly. No obvious fluctuance. Ventral hernia noted.  Psych: A&Ox3    Lab Results:  Recent Labs    02/11/24 0220 02/11/24 0620  WBC 6.4 6.9  HGB 12.4 9.7*  HCT 39.6 30.7*  PLT 207 253   BMET Recent Labs    02/11/24 0220 02/11/24 0620  NA 141 142  K 3.1* 3.4*  CL 102 104  CO2 28 27  GLUCOSE 99 89   BUN 15 15  CREATININE 1.19* 1.12*  CALCIUM 9.2 9.2   PT/INR Recent Labs    02/11/24 0220  LABPROT 13.8  INR 1.0   CMP     Component Value Date/Time   NA 142 02/11/2024 0620   K 3.4 (L) 02/11/2024 0620   CL 104 02/11/2024 0620   CO2 27 02/11/2024 0620   GLUCOSE 89 02/11/2024 0620   BUN 15 02/11/2024 0620   CREATININE 1.12 (H) 02/11/2024 0620   CREATININE 1.26 (H) 11/13/2023 1248   CALCIUM 9.2 02/11/2024 0620   PROT 7.5 02/11/2024 0220   ALBUMIN 2.7 (L) 02/11/2024 0220   AST 23 02/11/2024 0220   AST 16 11/13/2023 1248   ALT 13 02/11/2024 0220   ALT 8 11/13/2023 1248   ALKPHOS 67 02/11/2024 0220   BILITOT 0.6 02/11/2024 0220   BILITOT 0.5 11/13/2023 1248   GFRNONAA 48 (L) 02/11/2024 0620   GFRNONAA 42 (L) 11/13/2023 1248   GFRAA 48 (L) 12/02/2018 1255   Lipase     Component Value Date/Time   LIPASE 17 01/17/2024 1830    Studies/Results: CT ABDOMEN PELVIS WO CONTRAST Result Date: 02/11/2024 CLINICAL DATA:  Laparotomy and partial colectomy 01/17/2024 for colonic adenocarcinoma in the cecum/ascending colon. Presents with wound infection. EXAM: CT ABDOMEN AND PELVIS WITHOUT CONTRAST TECHNIQUE: Multidetector CT imaging of the abdomen and pelvis was performed following the standard protocol  without IV contrast. RADIATION DOSE REDUCTION: This exam was performed according to the departmental dose-optimization program which includes automated exposure control, adjustment of the mA and/or kV according to patient size and/or use of iterative reconstruction technique. COMPARISON:  Postoperative CT with IV contrast 01/25/2024, preoperative CT with IV contrast 01/17/2024. FINDINGS: Lower chest: There are bilateral trace pleural effusions, improved. There is posterior basal atelectasis in the lower lobes without infiltrates. Moderate panchamber cardiomegaly is again noted. Small pericardial effusion increased. Hepatobiliary: There is no focal liver abnormality without contrast. Status post  cholecystectomy with again noted intrahepatic and extrahepatic biliary dilatation, common bile duct again 13 mm. No filling defect is seen in the duct. Pancreas: Partially atrophic. No focal abnormality without contrast. Spleen: Hypodense splenic lesions are again noted, largest 2.4 cm medially, indeterminate. Others are smallerbut with similar imaging features. MRI without and with contrast is recommended when clinically feasible. Adrenals/Urinary Tract: Stable small bilateral Bosniak 1 renal cysts. Stable 1.2 cm right adrenal adenoma, Hounsfield density is 5.5. No follow-up imaging is required.  No left adrenal lesion is seen. No new contour deforming renal abnormality is evident and no increased perinephric edema. There is no urinary stone or obstruction. There is increased bladder thickening versus underdistention. Correlate with urinalysis for potential cystitis. Stomach/Bowel: Unremarkable contracted stomach. Normal caliber of the small bowel. Right hemicolectomy with primary surgical ileocolic anastomosis in the right lower quadrant. There is increased moderate fecal stasis in the transverse and proximal descending colon, fecal back up into the distal ileum. No upstream mechanical obstruction is seen. No findings of acute colitis or diverticulitis. Vascular/Lymphatic: Aortic atherosclerosis. No enlarged abdominal or pelvic lymph nodes. Reproductive: Uterus and bilateral adnexa are unremarkable. Other: Patchy mesenteric edema continues to be seen abdomen and upper pelvis. This could be congestive or inflammatory. Laparotomy skin staples interval removed. Skin thickening and subcutaneous stranding extending increasingly over the mid to lower abdominal wall anteriorly and could be due to third spacing of fluid or inflammation such as cellulitis. There is no discrete fluid collection. Supraumbilical anterior wall hernia previously contained nonobstructed small bowel segment now contains only fluid. There is mild  scattered fluid in the mesenteric folds, small amount in the subhepatic space, small amount in the pelvis, less than previously. There is no free air, abscess or free hemorrhage. Musculoskeletal: There is generalized bone marrow heterogeneity without a discrete focal lytic or blastic lesion. This was also seen previously. No fracture is evident. IMPRESSION: 1. Skin thickening and subcutaneous stranding extending increasingly over the mid to lower abdominal wall anteriorly and could be due to third spacing of fluid or inflammation such as cellulitis. No discrete fluid collection is seen. 2. Supraumbilical anterior wall hernia previously contained nonobstructed small bowel now contains only fluid. 3. Increased fecal stasis in the transverse and proximal descending colon, with fecal back up into the distal ileum. No upstream mechanical obstruction. 4. Patchy mesenteric edema continues to be seen abdomen and upper pelvis. This could be congestive or inflammatory. 5. Increased bladder thickening versus underdistention. Correlate with urinalysis for potential cystitis. 6. Hypodense splenic lesions, largest 2.4 cm medially, indeterminate. MRI without and with contrast is recommended when clinically feasible. 7. Stable 1.2 cm right adrenal adenoma. No follow-up imaging is required. 8. Aortic atherosclerosis. 9. Cardiomegaly with small pericardial effusion increased. 10. Trace pleural effusions, improved. 11. Generalized bone marrow heterogeneity without a discrete focal lytic or blastic lesion. This was also seen previously. Electronically Signed   By: Almira Bar M.D.   On: 02/11/2024  04:16   DG Chest Portable 1 View Result Date: 02/11/2024 CLINICAL DATA:  Possible infection EXAM: PORTABLE CHEST 1 VIEW COMPARISON:  01/24/2024 FINDINGS: Cardiomegaly with possible small pleural effusions. Streaky bibasilar airspace disease. No pneumothorax IMPRESSION: Cardiomegaly with possible small pleural effusions. Streaky bibasilar  airspace disease, atelectasis versus pneumonia. Electronically Signed   By: Jasmine Pang M.D.   On: 02/11/2024 01:30    Anti-infectives: Anti-infectives (From admission, onward)    Start     Dose/Rate Route Frequency Ordered Stop   02/11/24 0300  piperacillin-tazobactam (ZOSYN) IVPB 3.375 g        3.375 g 12.5 mL/hr over 240 Minutes Intravenous Every 8 hours 02/11/24 0245 02/18/24 0259        Assessment/Plan Hx of Open right hemicolectomy with ileocolic anastomosis by Dr. Freida Busman on 01/18/24 for  Obstructive cecal mass  Path w/ stage IIa colon cancer. Seen by Dr. [name] during last admission. Oncology plans for outpt follow up. Was scheduled for f/u day of admission Here w/ soft tissue infection of midline wound - CT w/o focal drainable fluid collection - Will pack wound w/ 1/4 iodoform packing daily - Cont abx  - If any concerns in characteristic of drainage, could repeat CT A/P w/ PO contrast.  - Diet as tolerated - Mobilize - Pulm toilet - Home meds  FEN - Reg diet. BID colace and miralax bowel regimen. On Remeron already. Hypokalemia replaced (K 3.4) VTE - SCDs, Lovenox. Hgb 9.7 (12.4) and near baseline.  ID - Zosyn. Afebrile. WBC wnl. Lactic wnl on admit.   Hx CHF - CT w/ Cardiomegaly small perdicardial effusion. Trace b/l effusions. On RA. She takes Lasix 20mg  PRN at home. Will order dose for today.  Hx HTN - home meds Hx pre-diabetes - SSI Hx Hypothyroidism - home meds  Incidental findings - Splenic lesion, R Adrenal adenoma, Aortic atherosclerosis, Generalized bone marrow heterogeneity without a discrete focal lytic or blastic lesion. Will need outpt f/u for these.    LOS: 0 days    Jacinto Halim, [facility] 02/11/2024, 8:45 AM Please see Amion for pager number during day hours 7:00am-4:30pm

## 2024-02-11 NOTE — ED Notes (Signed)
 2W called and aware of pt arrival

## 2024-02-11 NOTE — H&P (Addendum)
 Surgical Evaluation  Chief Complaint: Wound infection  HPI: 88 year old woman with history significant for type 2 diabetes, CKD 3, CHF, hypertension, hyperlipidemia, hypothyroidism, iron deficiency anemia, essential thrombocytosis, glaucoma, splenic masses, who was discharged 3/26 following emergent right hemicolectomy with ileocolic anastomosis for obstructing mass (ultimate diagnosis stage IIa colon cancer) on 3/14.  Postop course was complicated by ileus, anemia, chest pain with reassuring workup, electrolyte derangements, Klebsiella UTI, AKI.  She has overall been doing well at home, tolerating a diet and bowel function has been appropriate, although notes early satiety.  She followed up with her PCP on 4/1 and her daughter states that there was some erythema at that time.  On 4/4 they noted swelling and firmness at the upper aspect of the incision and did try to call.  Began to drain purulent fluid and has dramatically worsened in the last 24 hours.  No associated fevers or emesis.  Allergies  Allergen Reactions   Penicillin G Hives   Shellfish Allergy Hives    Past Medical History:  Diagnosis Date   CHF (congestive heart failure) (HCC)    Hypertension    Pre-diabetes    Thrombocytosis    Thyroid disease     Past Surgical History:  Procedure Laterality Date   CHOLECYSTECTOMY     LAPAROTOMY N/A 01/18/2024   Procedure: LAPAROTOMY, EXPLORATORY;  Surgeon: Fritzi Mandes, MD;  Location: [facility] OR;  Service: General;  Laterality: N/A;   PARTIAL COLECTOMY N/A 01/18/2024   Procedure: COLECTOMY, PARTIAL;  Surgeon: Fritzi Mandes, MD;  Location: [facility] OR;  Service: General;  Laterality: N/A;    Family History  Problem Relation Age of Onset   Hypertension Mother    Hypertension Father     Social History   Socioeconomic History   Marital status: Widowed    Spouse name: Not on file   Number of children: 9   Years of education: Not on file   Highest education level: Not on file   Occupational History   Not on file  Tobacco Use   Smoking status: Never   Smokeless tobacco: Never  Vaping Use   Vaping status: Never Used  Substance and Sexual Activity   Alcohol use: No   Drug use: Never   Sexual activity: Never  Other Topics Concern   Not on file  Social History Narrative   Not on file   Social Drivers of Health   Financial Resource Strain: Not on file  Food Insecurity: No Food Insecurity (01/31/2024)   Hunger Vital Sign    Worried About Running Out of Food in the Last Year: Never true    Ran Out of Food in the Last Year: Never true  Transportation Needs: No Transportation Needs (01/31/2024)   PRAPARE - Administrator, Civil Service (Medical): No    Lack of Transportation (Non-Medical): No  Physical Activity: Not on file  Stress: Not on file  Social Connections: Unknown (01/18/2024)   Social Connection and Isolation Panel [NHANES]    Frequency of Communication with Friends and Family: Three times a week    Frequency of Social Gatherings with Friends and Family: Once a week    Attends Religious Services: 1 to 4 times per year    Active Member of [employer] or Organizations: No    Attends Banker Meetings: Never    Marital Status: Patient declined    No current facility-administered medications on file prior to encounter.   Current Outpatient Medications on File Prior to  Encounter  Medication Sig Dispense Refill   acetaminophen (TYLENOL) 325 MG tablet Take 325 mg by mouth as needed for moderate pain (pain score 4-6), headache or mild pain (pain score 1-3). (Patient taking differently: Take 500 mg by mouth daily as needed for moderate pain (pain score 4-6), headache or mild pain (pain score 1-3).)     amLODipine (NORVASC) 5 MG tablet Take 5 mg by mouth at bedtime.     anagrelide (AGRYLIN) 0.5 MG capsule Take 2 capsules (1 mg) by mouth 2 times daily. 360 capsule 3   folic acid (FOLVITE) 1 MG tablet Take 1 mg by mouth daily.  5    furosemide (LASIX) 20 MG tablet Take 1 tablet (20 mg total) by mouth as needed. (Patient taking differently: Take 20 mg by mouth as needed for fluid or edema.) 90 tablet 3   gabapentin (NEURONTIN) 300 MG capsule Take 300 mg by mouth daily as needed (burning).     hydrochlorothiazide (HYDRODIURIL) 12.5 MG tablet Take 12.5 mg by mouth daily as needed (high BP).     latanoprost (XALATAN) 0.005 % ophthalmic solution Place 1 drop into both eyes at bedtime.     levothyroxine (SYNTHROID) 100 MCG tablet Take 1 tablet (100 mcg total) by mouth daily before breakfast. 30 tablet 3   losartan (COZAAR) 50 MG tablet Take 1 tablet (50 mg total) by mouth daily. 90 tablet 3   metFORMIN (GLUCOPHAGE) 500 MG tablet Take 250 mg by mouth 2 (two) times daily.     mirtazapine (REMERON) 30 MG tablet Take 30 mg by mouth at bedtime.     nitroGLYCERIN (NITROSTAT) 0.4 MG SL tablet Place 1 tablet (0.4 mg total) under the tongue every 5 (five) minutes as needed for chest pain. 90 tablet 3   omeprazole (PRILOSEC) 20 MG capsule TAKE ONE CAPSULE BY MOUTH EVERY DAY 90 capsule 3   Polyethyl Glycol-Propyl Glycol (SYSTANE OP) Place 1 drop into both eyes as needed (irritation).     potassium chloride SA (KLOR-CON) 20 MEQ tablet TAKE 1 TABLET(20 MEQ) BY MOUTH DAILY WITH LASIX AND FUROSEMIDE AS NEEDED (Patient taking differently: Take 20 mEq by mouth daily as needed (when taking lasix).) 90 tablet 3   propranolol (INDERAL) 10 MG tablet Take 5 mg by mouth 3 (three) times daily.     rosuvastatin (CRESTOR) 10 MG tablet Take 10 mg by mouth daily.     senna (SENOKOT) 8.6 MG TABS tablet Take 2 tablets (17.2 mg total) by mouth daily as needed for moderate constipation. 30 each 0   vitamin B-12 (CYANOCOBALAMIN) 1000 MCG tablet Take 1,000 mcg by mouth daily.      Review of Systems: a complete, 10pt review of systems was completed with pertinent positives and negatives as documented in the HPI  Physical Exam: Vitals:   02/11/24 0053  BP: (!)  154/99  Pulse: 100  Resp: 18  Temp: 98 F (36.7 C)  SpO2: 95%   Gen: A&Ox3, no distress  Chest: respiratory effort is normal.  Cardiovascular: RRR Gastrointestinal: soft, nondistended.  Had upper aspect of incision is an approximately 10 x 8 cm area of swelling, induration, erythema and tenderness with a central opening in the incision about 5 mm in diameter that drains thick purulent fluid with compression.  A Q-tip was able to be inserted in this and tracks superiorly a couple centimeters, does not seem to track deep or inferiorly.  Hernia superior to the incision noted at previous admission is not overtly palpable currently  Skin: warm and dry      Latest Ref Rng & Units 02/11/2024    2:20 AM 01/29/2024    4:18 AM 01/28/2024    2:28 AM  CBC  WBC 4.0 - 10.5 K/uL 6.4  9.4  11.4   Hemoglobin 12.0 - 15.0 g/dL 40.9  9.3  9.2   Hematocrit 36.0 - 46.0 % 39.6  29.5  29.4   Platelets 150 - 400 K/uL 207  479  479        Latest Ref Rng & Units 01/29/2024    4:18 AM 01/28/2024    2:28 AM 01/27/2024    2:47 AM  CMP  Glucose 70 - 99 mg/dL 811  914  782   BUN 8 - 23 mg/dL 28  29  23    Creatinine 0.44 - 1.00 mg/dL 9.56  2.13  0.86   Sodium 135 - 145 mmol/L 142  139  138   Potassium 3.5 - 5.1 mmol/L 4.3  3.9  4.1   Chloride 98 - 111 mmol/L 108  107  105   CO2 22 - 32 mmol/L 26  26  26    Calcium 8.9 - 10.3 mg/dL 8.7  8.5  8.4   Total Protein 6.5 - 8.1 g/dL 6.0  5.5  5.6   Total Bilirubin 0.0 - 1.2 mg/dL 0.4  0.4  0.6   Alkaline Phos 38 - 126 U/L 54  55  59   AST 15 - 41 U/L 20  22  26    ALT 0 - 44 U/L 14  16  20      No results found for: "INR", "PROTIME"  Imaging: DG Chest Portable 1 View Result Date: 02/11/2024 CLINICAL DATA:  Possible infection EXAM: PORTABLE CHEST 1 VIEW COMPARISON:  01/24/2024 FINDINGS: Cardiomegaly with possible small pleural effusions. Streaky bibasilar airspace disease. No pneumothorax IMPRESSION: Cardiomegaly with possible small pleural effusions. Streaky bibasilar  airspace disease, atelectasis versus pneumonia. Electronically Signed   By: Jasmine Pang M.D.   On: 02/11/2024 01:30     A/P: 88 year old with multiple significant comorbidities who is now 3-1/2 weeks status post emergent right colectomy presents with wound infection.  She is afebrile and her white count is normal, but given her age and medical history I think most prudent to admit for IV antibiotics and close monitoring of this wound, which may ultimately need to be opened up further so that it can be packed more readily.  Will order a noncontrast CT scan to ensure no deeper component or other complicating feature.   Hypokalemia-p.o. replacement ordered.  Creatinine is 1.19, from 1.12 at discharge     Patient Active Problem List   Diagnosis Date Noted   Wound infection 02/11/2024   Postoperative ileus (HCC) 01/21/2024   Hypomagnesemia 01/19/2024   AKI (acute kidney injury) (HCC) 01/19/2024   Urinary tract infection without hematuria 01/18/2024   Small bowel obstruction (HCC) 01/17/2024   Colonic mass 01/17/2024   Chronic kidney disease, stage 3a (HCC) 01/17/2024   Iron deficiency anemia due to chronic blood loss 05/23/2022   Leg edema 08/01/2021   Chest pain 07/29/2020   Exertional dyspnea 03/24/2019   Dyslipidemia 03/04/2019   Gastroesophageal reflux disease 03/04/2019   Dilated aortic root (HCC) 03/04/2019   Newly diagnosed diabetes (HCC) 04/30/2018   Essential thrombocytosis (HCC) 04/08/2018   Constipation 07/12/2014   Vomiting 07/12/2014   Essential hypertension, benign 07/12/2014   Hypokalemia 07/12/2014   Exertional chest pain 07/11/2014   Hypertensive urgency 07/11/2014  Hypothyroidism 07/11/2014       Phylliss Blakes, MD [facility]  See AMION to contact appropriate on-call provider

## 2024-02-11 NOTE — Progress Notes (Signed)
 Pharmacy Antibiotic Note  [name] is a 88 y.o. female admitted on 02/11/2024 with concern for wound infection. Recent surgery 3/14 for SBO now with pain and drainage at surgical site. Noted rash to penicillin but has previously been prescribed amoxicillin and cephalosporins per chart review. Pharmacy has been consulted for zosyn dosing.  Plan: Zosyn 3.375g q8h  F/u surgical recs, renal function and infectious work up    Temp (24hrs), Avg:98 F (36.7 C), Min:98 F (36.7 C), Max:98 F (36.7 C)  Recent Labs  Lab 02/11/24 0113 02/11/24 0220  WBC  --  6.4  LATICACIDVEN 0.8  --     Estimated Creatinine Clearance: 31.9 mL/min (A) (by C-G formula based on SCr of 1.12 mg/dL (H)).    Allergies  Allergen Reactions   Penicillin G Hives   Shellfish Allergy Hives    Antimicrobials this admission: Zosyn 4/7 >  Microbiology results: 4/7 BCx:   Thank you for allowing pharmacy to be a part of this patient's care.  Marja Kays 02/11/2024 2:46 AM

## 2024-02-11 NOTE — ED Triage Notes (Signed)
 Pt here from home with c/o possible wound infection from a surgery that she was 01/18/2024 family states no fevers , pt has some drainage from surgical site and pain

## 2024-02-11 NOTE — ED Notes (Signed)
 Pt in CT. Will go to 2W19 when complete.

## 2024-02-11 NOTE — Care Management Obs Status (Cosign Needed)
 MEDICARE OBSERVATION STATUS NOTIFICATION   Patient Details  Name: [name] MRN: [id number] Date of Birth: 1936/03/14   Medicare Observation Status Notification Given:  Yes    Janae Bridgeman, RN 02/11/2024, 3:41 PM

## 2024-02-12 DIAGNOSIS — E876 Hypokalemia: Secondary | ICD-10-CM | POA: Diagnosis present

## 2024-02-12 DIAGNOSIS — E039 Hypothyroidism, unspecified: Secondary | ICD-10-CM | POA: Diagnosis present

## 2024-02-12 DIAGNOSIS — Z8249 Family history of ischemic heart disease and other diseases of the circulatory system: Secondary | ICD-10-CM | POA: Diagnosis not present

## 2024-02-12 DIAGNOSIS — K439 Ventral hernia without obstruction or gangrene: Secondary | ICD-10-CM | POA: Diagnosis present

## 2024-02-12 DIAGNOSIS — L03818 Cellulitis of other sites: Secondary | ICD-10-CM | POA: Diagnosis present

## 2024-02-12 DIAGNOSIS — E1122 Type 2 diabetes mellitus with diabetic chronic kidney disease: Secondary | ICD-10-CM | POA: Diagnosis present

## 2024-02-12 DIAGNOSIS — C189 Malignant neoplasm of colon, unspecified: Secondary | ICD-10-CM | POA: Diagnosis present

## 2024-02-12 DIAGNOSIS — Z79899 Other long term (current) drug therapy: Secondary | ICD-10-CM | POA: Diagnosis not present

## 2024-02-12 DIAGNOSIS — H409 Unspecified glaucoma: Secondary | ICD-10-CM | POA: Diagnosis present

## 2024-02-12 DIAGNOSIS — K219 Gastro-esophageal reflux disease without esophagitis: Secondary | ICD-10-CM | POA: Diagnosis present

## 2024-02-12 DIAGNOSIS — I130 Hypertensive heart and chronic kidney disease with heart failure and stage 1 through stage 4 chronic kidney disease, or unspecified chronic kidney disease: Secondary | ICD-10-CM | POA: Diagnosis present

## 2024-02-12 DIAGNOSIS — T148XXA Other injury of unspecified body region, initial encounter: Secondary | ICD-10-CM | POA: Diagnosis present

## 2024-02-12 DIAGNOSIS — E785 Hyperlipidemia, unspecified: Secondary | ICD-10-CM | POA: Diagnosis present

## 2024-02-12 DIAGNOSIS — L03311 Cellulitis of abdominal wall: Secondary | ICD-10-CM | POA: Diagnosis present

## 2024-02-12 DIAGNOSIS — Z91013 Allergy to seafood: Secondary | ICD-10-CM | POA: Diagnosis not present

## 2024-02-12 DIAGNOSIS — Z7984 Long term (current) use of oral hypoglycemic drugs: Secondary | ICD-10-CM | POA: Diagnosis not present

## 2024-02-12 DIAGNOSIS — Y838 Other surgical procedures as the cause of abnormal reaction of the patient, or of later complication, without mention of misadventure at the time of the procedure: Secondary | ICD-10-CM | POA: Diagnosis present

## 2024-02-12 DIAGNOSIS — Z880 Allergy status to penicillin: Secondary | ICD-10-CM | POA: Diagnosis not present

## 2024-02-12 DIAGNOSIS — N1831 Chronic kidney disease, stage 3a: Secondary | ICD-10-CM | POA: Diagnosis present

## 2024-02-12 DIAGNOSIS — Z79890 Hormone replacement therapy (postmenopausal): Secondary | ICD-10-CM | POA: Diagnosis not present

## 2024-02-12 DIAGNOSIS — T8141XA Infection following a procedure, superficial incisional surgical site, initial encounter: Secondary | ICD-10-CM | POA: Diagnosis present

## 2024-02-12 DIAGNOSIS — N179 Acute kidney failure, unspecified: Secondary | ICD-10-CM | POA: Diagnosis present

## 2024-02-12 DIAGNOSIS — I509 Heart failure, unspecified: Secondary | ICD-10-CM | POA: Diagnosis present

## 2024-02-12 LAB — BASIC METABOLIC PANEL WITH GFR
Anion gap: 12 (ref 5–15)
Anion gap: 10 (ref 5–15)
BUN: 17 mg/dL (ref 8–23)
BUN: 17 mg/dL (ref 8–23)
CO2: 27 mmol/L (ref 22–32)
CO2: 27 mmol/L (ref 22–32)
Calcium: 9.4 mg/dL (ref 8.9–10.3)
Calcium: 8.9 mg/dL (ref 8.9–10.3)
Chloride: 103 mmol/L (ref 98–111)
Chloride: 104 mmol/L (ref 98–111)
Creatinine, Ser: 1.4 mg/dL — ABNORMAL HIGH (ref 0.44–1.00)
Creatinine, Ser: 1.47 mg/dL — ABNORMAL HIGH (ref 0.44–1.00)
GFR, Estimated: 37 mL/min — ABNORMAL LOW (ref >60)
GFR, Estimated: 35 mL/min — ABNORMAL LOW (ref >60)
Glucose, Bld: 102 mg/dL — ABNORMAL HIGH (ref 70–99)
Glucose, Bld: 110 mg/dL — ABNORMAL HIGH (ref 70–99)
Potassium: 4.1 mmol/L (ref 3.5–5.1)
Potassium: 3.2 mmol/L — ABNORMAL LOW (ref 3.5–5.1)
Sodium: 142 mmol/L (ref 135–145)
Sodium: 141 mmol/L (ref 135–145)

## 2024-02-12 LAB — GLUCOSE, CAPILLARY
Glucose-Capillary: 118 mg/dL — ABNORMAL HIGH (ref 70–99)
Glucose-Capillary: 97 mg/dL (ref 70–99)
Glucose-Capillary: 145 mg/dL — ABNORMAL HIGH (ref 70–99)
Glucose-Capillary: 80 mg/dL (ref 70–99)

## 2024-02-12 LAB — CBC
HCT: 33.4 % — ABNORMAL LOW (ref 36.0–46.0)
Hemoglobin: 10.4 g/dL — ABNORMAL LOW (ref 12.0–15.0)
MCH: 25.8 pg — ABNORMAL LOW (ref 26.0–34.0)
MCHC: 31.1 g/dL (ref 30.0–36.0)
MCV: 82.9 fL (ref 80.0–100.0)
Platelets: 286 10*3/uL (ref 150–400)
RBC: 4.03 MIL/uL (ref 3.87–5.11)
RDW: 18.7 % — ABNORMAL HIGH (ref 11.5–15.5)
WBC: 6.9 10*3/uL (ref 4.0–10.5)
nRBC: 0 % (ref 0.0–0.2)

## 2024-02-12 MED ORDER — SODIUM CHLORIDE 0.45 % IV SOLN
INTRAVENOUS | Status: DC
  Filled 2024-02-12: qty 1000

## 2024-02-12 MED ORDER — POTASSIUM CHLORIDE CRYS ER 20 MEQ PO TBCR
40.0000 meq | EXTENDED_RELEASE_TABLET | Freq: Two times a day (BID) | ORAL | Status: AC
Start: 2024-02-12 — End: 2024-02-12
  Administered 2024-02-12 (×2): 40 meq via ORAL
  Filled 2024-02-12 (×2): qty 2

## 2024-02-12 MED ORDER — SODIUM CHLORIDE 0.9 % IV SOLN: INTRAVENOUS | Status: DC

## 2024-02-12 NOTE — Progress Notes (Signed)
 Subjective: CC: No abdominal pain. Tolerating reg diet without n/v. BM yesterday. Voiding without issues. Denies sob.   Her daughter and son are in the room. Her other daughter and son are on speaker phone during the visit. All updated with her permission.   Afebrile (Tmax 99.6). WBC wnl. Cr 1.4 (1.12).   Objective: Vital signs in last 24 hours: Temp:  [97.5 F (36.4 C)-99.6 F (37.6 C)] 99.6 F (37.6 C) (04/08 0835) Pulse Rate:  [68-82] 69 (04/08 0835) Resp:  [16-20] 20 (04/08 0835) BP: (114-148)/(67-99) 148/85 (04/08 0835) SpO2:  [95 %-100 %] 100 % (04/08 0835) Last BM Date : 02/11/24  Intake/Output from previous day: 04/07 0701 - 04/08 0700 In: 360 [P.O.:360] Out: -  Intake/Output this shift: No intake/output data recorded.  PE: Gen:  Alert, NAD, pleasant Card:  Reg Pulm:  CTA b/l. Rate and effort normal Abd: Soft, ND,, NT except over midline wound where cellulitis is present, +BS. Midline wound with improving area of erythema, heat and induration along the superior aspect. Induration is more along the R lateral portion of midline wound but softer today. There is a 1cm opening along midline wound, centrally in the area of erythema that has scant purulent drainage. This tracks 3cm superiorly. No obvious fluctuance. Ventral hernia noted without overlying skin changes and soft, NT  Psych: A&Ox3   Lab Results:  Recent Labs    02/11/24 0620 02/12/24 0604  WBC 6.9 6.9  HGB 9.7* 10.4*  HCT 30.7* 33.4*  PLT 253 286   BMET Recent Labs    02/11/24 0620 02/12/24 0604  NA 142 142  K 3.4* 4.1  CL 104 103  CO2 27 27  GLUCOSE 89 102*  BUN 15 17  CREATININE 1.12* 1.40*  CALCIUM 9.2 9.4   PT/INR Recent Labs    02/11/24 0220  LABPROT 13.8  INR 1.0   CMP     Component Value Date/Time   NA 142 02/12/2024 0604   K 4.1 02/12/2024 0604   CL 103 02/12/2024 0604   CO2 27 02/12/2024 0604   GLUCOSE 102 (H) 02/12/2024 0604   BUN 17 02/12/2024 0604    CREATININE 1.40 (H) 02/12/2024 0604   CREATININE 1.26 (H) 11/13/2023 1248   CALCIUM 9.4 02/12/2024 0604   PROT 7.5 02/11/2024 0220   ALBUMIN 2.7 (L) 02/11/2024 0220   AST 23 02/11/2024 0220   AST 16 11/13/2023 1248   ALT 13 02/11/2024 0220   ALT 8 11/13/2023 1248   ALKPHOS 67 02/11/2024 0220   BILITOT 0.6 02/11/2024 0220   BILITOT 0.5 11/13/2023 1248   GFRNONAA 37 (L) 02/12/2024 0604   GFRNONAA 42 (L) 11/13/2023 1248   GFRAA 48 (L) 12/02/2018 1255   Lipase     Component Value Date/Time   LIPASE 17 01/17/2024 1830    Studies/Results: CT ABDOMEN PELVIS WO CONTRAST Result Date: 02/11/2024 CLINICAL DATA:  Laparotomy and partial colectomy 01/17/2024 for colonic adenocarcinoma in the cecum/ascending colon. Presents with wound infection. EXAM: CT ABDOMEN AND PELVIS WITHOUT CONTRAST TECHNIQUE: Multidetector CT imaging of the abdomen and pelvis was performed following the standard protocol without IV contrast. RADIATION DOSE REDUCTION: This exam was performed according to the departmental dose-optimization program which includes automated exposure control, adjustment of the mA and/or kV according to patient size and/or use of iterative reconstruction technique. COMPARISON:  Postoperative CT with IV contrast 01/25/2024, preoperative CT with IV contrast 01/17/2024. FINDINGS: Lower chest: There are bilateral trace pleural effusions, improved.  There is posterior basal atelectasis in the lower lobes without infiltrates. Moderate panchamber cardiomegaly is again noted. Small pericardial effusion increased. Hepatobiliary: There is no focal liver abnormality without contrast. Status post cholecystectomy with again noted intrahepatic and extrahepatic biliary dilatation, common bile duct again 13 mm. No filling defect is seen in the duct. Pancreas: Partially atrophic. No focal abnormality without contrast. Spleen: Hypodense splenic lesions are again noted, largest 2.4 cm medially, indeterminate. Others are  smallerbut with similar imaging features. MRI without and with contrast is recommended when clinically feasible. Adrenals/Urinary Tract: Stable small bilateral Bosniak 1 renal cysts. Stable 1.2 cm right adrenal adenoma, Hounsfield density is 5.5. No follow-up imaging is required.  No left adrenal lesion is seen. No new contour deforming renal abnormality is evident and no increased perinephric edema. There is no urinary stone or obstruction. There is increased bladder thickening versus underdistention. Correlate with urinalysis for potential cystitis. Stomach/Bowel: Unremarkable contracted stomach. Normal caliber of the small bowel. Right hemicolectomy with primary surgical ileocolic anastomosis in the right lower quadrant. There is increased moderate fecal stasis in the transverse and proximal descending colon, fecal back up into the distal ileum. No upstream mechanical obstruction is seen. No findings of acute colitis or diverticulitis. Vascular/Lymphatic: Aortic atherosclerosis. No enlarged abdominal or pelvic lymph nodes. Reproductive: Uterus and bilateral adnexa are unremarkable. Other: Patchy mesenteric edema continues to be seen abdomen and upper pelvis. This could be congestive or inflammatory. Laparotomy skin staples interval removed. Skin thickening and subcutaneous stranding extending increasingly over the mid to lower abdominal wall anteriorly and could be due to third spacing of fluid or inflammation such as cellulitis. There is no discrete fluid collection. Supraumbilical anterior wall hernia previously contained nonobstructed small bowel segment now contains only fluid. There is mild scattered fluid in the mesenteric folds, small amount in the subhepatic space, small amount in the pelvis, less than previously. There is no free air, abscess or free hemorrhage. Musculoskeletal: There is generalized bone marrow heterogeneity without a discrete focal lytic or blastic lesion. This was also seen previously.  No fracture is evident. IMPRESSION: 1. Skin thickening and subcutaneous stranding extending increasingly over the mid to lower abdominal wall anteriorly and could be due to third spacing of fluid or inflammation such as cellulitis. No discrete fluid collection is seen. 2. Supraumbilical anterior wall hernia previously contained nonobstructed small bowel now contains only fluid. 3. Increased fecal stasis in the transverse and proximal descending colon, with fecal back up into the distal ileum. No upstream mechanical obstruction. 4. Patchy mesenteric edema continues to be seen abdomen and upper pelvis. This could be congestive or inflammatory. 5. Increased bladder thickening versus underdistention. Correlate with urinalysis for potential cystitis. 6. Hypodense splenic lesions, largest 2.4 cm medially, indeterminate. MRI without and with contrast is recommended when clinically feasible. 7. Stable 1.2 cm right adrenal adenoma. No follow-up imaging is required. 8. Aortic atherosclerosis. 9. Cardiomegaly with small pericardial effusion increased. 10. Trace pleural effusions, improved. 11. Generalized bone marrow heterogeneity without a discrete focal lytic or blastic lesion. This was also seen previously. Electronically Signed   By: Almira Bar M.D.   On: 02/11/2024 04:16   DG Chest Portable 1 View Result Date: 02/11/2024 CLINICAL DATA:  Possible infection EXAM: PORTABLE CHEST 1 VIEW COMPARISON:  01/24/2024 FINDINGS: Cardiomegaly with possible small pleural effusions. Streaky bibasilar airspace disease. No pneumothorax IMPRESSION: Cardiomegaly with possible small pleural effusions. Streaky bibasilar airspace disease, atelectasis versus pneumonia. Electronically Signed   By: Adrian Prows.D.  On: 02/11/2024 01:30    Anti-infectives: Anti-infectives (From admission, onward)    Start     Dose/Rate Route Frequency Ordered Stop   02/11/24 0300  piperacillin-tazobactam (ZOSYN) IVPB 3.375 g        3.375 g 12.5  mL/hr over 240 Minutes Intravenous Every 8 hours 02/11/24 0245 02/18/24 0259        Assessment/Plan Hx of Open right hemicolectomy with ileocolic anastomosis by Dr. Freida Busman on 01/18/24 for  Obstructive cecal mass  Path w/ stage IIa colon cancer. Seen by Dr. [name] during last admission. Oncology plans for outpt follow up. Was scheduled for f/u day of admission Here w/ soft tissue infection of midline wound - CT w/o focal drainable fluid collection - Cont packing wound w/ 1/4 iodoform packing daily - Cont abx - If any concerns in characteristic of drainage, could repeat CT A/P w/ PO contrast.  - Diet as tolerated - Mobilize - Pulm toilet - Home meds - Plan pm d/c today if Cr improving on pm labs. I taught her daughter how to do dressing changes at bedside today. Will ask CM if she qualifies for [facility] RN. Will need f/u in the office in 1 week for wound check. Plan to d/c on PO abx. Discussed discharge instructions, restrictions and return/call back precautions.   FEN - Reg diet. BID colace and miralax bowel regimen. On Remeron already. = VTE - SCDs, Lovenox ID - Zosyn. Afebrile. WBC wnl.   Elevated Cr - Cr 1.4 from 1.12. Given lasix yesterday. Voiding per report. Will give gentle IVF today and recheck.  Hx CHF - CT w/ Cardiomegaly small perdicardial effusion. Trace b/l effusions. On RA. She takes Lasix 20mg  PRN at home. Given dose 4/7.  Hx HTN - home meds Hx pre-diabetes - SSI Hx Hypothyroidism - home meds  Incidental findings - Splenic lesion, R Adrenal adenoma, Aortic atherosclerosis, Generalized bone marrow heterogeneity without a discrete focal lytic or blastic lesion. Will need outpt f/u for these.    LOS: 0 days    Jacinto Halim, [facility] 02/12/2024, 9:24 AM Please see Amion for pager number during day hours 7:00am-4:30pm

## 2024-02-12 NOTE — TOC Progression Note (Signed)
 Transition of Care [facility]) - Progression Note    Patient Details  Name: Giulianna Rocha MRN: [id number] Date of Birth: 02/04/1936  [facility] CM/SW Contact  Janae Bridgeman, RN Phone Number: 02/12/2024, 10:16 AM  Clinical Narrative:    CM spoke with [facility] and patient is active for [facility] PT, RN, SW, aide.  Re-newed HH orders placed.  I asked bedside nursing to please instruct the family regarding wound care needs and supplies for home since home health RN does not visit daily.  CM will continue to follow the patient for TOC needs.        Expected Discharge Plan and Services                                               Social Determinants of Health (SDOH) Interventions SDOH Screenings   Food Insecurity: No Food Insecurity (02/11/2024)  Housing: Low Risk  (02/11/2024)  Transportation Needs: No Transportation Needs (02/11/2024)  Utilities: At Risk (02/11/2024)  Social Connections: Unknown (01/18/2024)  Tobacco Use: Low Risk  (02/11/2024)    Readmission Risk Interventions    01/28/2024    1:58 PM  Readmission Risk Prevention Plan  Transportation Screening Complete  PCP or Specialist Appt within 3-5 Days Complete  HRI or Home Care Consult Complete  Social Work Consult for Recovery Care Planning/Counseling Complete  Palliative Care Screening Complete

## 2024-02-12 NOTE — Plan of Care (Signed)

## 2024-02-13 ENCOUNTER — Telehealth: Payer: Self-pay | Admitting: Hematology

## 2024-02-13 ENCOUNTER — Other Ambulatory Visit (HOSPITAL_COMMUNITY): Payer: Self-pay

## 2024-02-13 LAB — BASIC METABOLIC PANEL WITH GFR
Anion gap: 10 (ref 5–15)
BUN: 15 mg/dL (ref 8–23)
CO2: 26 mmol/L (ref 22–32)
Calcium: 8.8 mg/dL — ABNORMAL LOW (ref 8.9–10.3)
Chloride: 105 mmol/L (ref 98–111)
Creatinine, Ser: 1.24 mg/dL — ABNORMAL HIGH (ref 0.44–1.00)
GFR, Estimated: 42 mL/min — ABNORMAL LOW (ref >60)
Glucose, Bld: 82 mg/dL (ref 70–99)
Potassium: 4.3 mmol/L (ref 3.5–5.1)
Sodium: 141 mmol/L (ref 135–145)

## 2024-02-13 LAB — GLUCOSE, CAPILLARY: Glucose-Capillary: 81 mg/dL (ref 70–99)

## 2024-02-13 MED ORDER — POLYETHYLENE GLYCOL 3350 17 G PO PACK: 17.0000 g | PACK | Freq: Two times a day (BID) | ORAL | Status: AC | PRN

## 2024-02-13 MED ORDER — TRAMADOL HCL 50 MG PO TABS
50.0000 mg | ORAL_TABLET | Freq: Four times a day (QID) | ORAL | 0 refills | Status: DC | PRN
  Filled 2024-02-13: qty 10, 3d supply, fill #0

## 2024-02-13 MED ORDER — METRONIDAZOLE 500 MG PO TABS
500.0000 mg | ORAL_TABLET | Freq: Three times a day (TID) | ORAL | 0 refills | Status: AC
  Filled 2024-02-13: qty 15, 5d supply, fill #0

## 2024-02-13 MED ORDER — DOCUSATE SODIUM 100 MG PO CAPS: 100.0000 mg | ORAL_CAPSULE | Freq: Two times a day (BID) | ORAL | Status: AC | PRN

## 2024-02-13 MED ORDER — CIPROFLOXACIN HCL 500 MG PO TABS
500.0000 mg | ORAL_TABLET | Freq: Two times a day (BID) | ORAL | 0 refills | Status: DC
  Filled 2024-02-13: qty 10, 5d supply, fill #0

## 2024-02-13 NOTE — Discharge Instructions (Signed)
 You were found to have incidental findings on your CT  Splenic lesions, largest 2.4 cm medially. Please follow up with your primary care for this. Radiology recommends a MRI without and with contrast  2. 1.2 cm right adrenal adenoma. Radiology did not recommend follow-up imaging

## 2024-02-13 NOTE — Progress Notes (Signed)
 DISCHARGE NOTE HOME [name] to be discharged Home per MD order. Discussed prescriptions and follow up appointments with the patient. Prescriptions given to patient; medication list explained in detail. Patient verbalized understanding.  Skin clean, dry and intact without evidence of skin break down, no evidence of skin tears noted. IV catheter discontinued intact. Site without signs and symptoms of complications. Dressing and pressure applied. Pt denies pain at the site currently. No complaints noted.  See LDA for wounds ABD wound  An After Visit Summary (AVS) was printed and given to the patient. Patient escorted via wheelchair, and discharged home via private auto.  Velia Meyer, RN

## 2024-02-13 NOTE — Discharge Summary (Signed)
 Patient ID: [name] [id number] 09-16-1936 88 y.o.  Admit date: 02/11/2024 Discharge date: 02/13/2024  Discharge Diagnosis Hx of Open right hemicolectomy with ileocolic anastomosis by Dr. Freida Busman on 01/18/24 for  Obstructive cecal mass - Path w/ stage IIa colon cancer.  Here w/ soft tissue infection of midline wound  Consultants None  HPI: 87 year old woman with history significant for type 2 diabetes, CKD 3, CHF, hypertension, hyperlipidemia, hypothyroidism, iron deficiency anemia, essential thrombocytosis, glaucoma, splenic masses, who was discharged 3/26 following emergent right hemicolectomy with ileocolic anastomosis for obstructing mass (ultimate diagnosis stage IIa colon cancer) on 3/14.  Postop course was complicated by ileus, anemia, chest pain with reassuring workup, electrolyte derangements, Klebsiella UTI, AKI.  She has overall been doing well at home, tolerating a diet and bowel function has been appropriate, although notes early satiety.  She followed up with her PCP on 4/1 and her daughter states that there was some erythema at that time.  On 4/4 they noted swelling and firmness at the upper aspect of the incision and did try to call.  Began to drain purulent fluid and has dramatically worsened in the last 24 hours.  No associated fevers or emesis.   Procedures None  Hospital Course:  Patient w/ Hx of Open right hemicolectomy with ileocolic anastomosis by Dr. Freida Busman on 01/18/24 for  Obstructive cecal mass. Path w/ stage IIa colon cancer. Seen by Dr. [name] during last admission. Was scheduled for f/u day of admission  Presented as above and found to have soft tissue infection of midline wound that spontaneously drained. Started on abx. CT w/o focal drainable fluid collection. Wound packed daily w/ 1/4 iodoform packing. On 4/9 pt was tolerating a diet without n/v, pain well controlled, HDS, voiding, Cr improving, and felt stable for d/c home. I taught her daughter how to do  dressing changes at bedside. CM arranged HH RN. Follow up arranged next week for recheck. She does have pcn allergy - plan cipro/flagyl at d/c. Discussed risk/side effects of cipro. Discussed discharge instructions, restrictions and return/call back precautions. Additional follow up as noted below.   Incidental findings - Splenic lesion, R Adrenal adenoma, Aortic atherosclerosis, Generalized bone marrow heterogeneity without a discrete focal lytic or blastic lesion. Will need outpt f/u for these.   Physical Exam: Gen:  Alert, NAD, pleasant Card:  Reg Pulm:  CTA b/l. Rate and effort normal Abd: Soft, ND, NT except over midline wound where cellulitis is present, +BS. Midline wound with improving area of erythema, heat and induration along the superior aspect. Induration is more along the R lateral portion of midline wound but softer today. There is a 1cm opening along midline wound, centrally in the area of erythema that has scant purulent drainage. This tracks 3cm superiorly. No obvious fluctuance. Ventral hernia noted without overlying skin changes and soft, NT  Msk: No LE edema Psych: A&Ox3    Allergies as of 02/13/2024       Reactions   Penicillin G Hives   Shellfish Allergy Hives        Medication List     STOP taking these medications    oxycodone 5 MG capsule Commonly known as: OXY-IR       TAKE these medications    acetaminophen 500 MG tablet Commonly known as: TYLENOL Take 500 mg by mouth every 6 (six) hours as needed for mild pain (pain score 1-3) or moderate pain (pain score 4-6).   amLODipine 5 MG tablet Commonly known as: NORVASC  Take 5 mg by mouth at bedtime.   anagrelide 0.5 MG capsule Commonly known as: AGRYLIN Take 2 capsules (1 mg) by mouth 2 times daily.   ciprofloxacin 500 MG tablet Commonly known as: Cipro Take 1 tablet (500 mg total) by mouth 2 (two) times daily.   cyanocobalamin 1000 MCG tablet Commonly known as: VITAMIN B12 Take 1,000 mcg by  mouth daily.   docusate sodium 100 MG capsule Commonly known as: COLACE Take 1 capsule (100 mg total) by mouth 2 (two) times daily as needed for mild constipation.   folic acid 1 MG tablet Commonly known as: FOLVITE Take 1 mg by mouth daily.   furosemide 20 MG tablet Commonly known as: LASIX Take 1 tablet (20 mg total) by mouth as needed. What changed: reasons to take this   gabapentin 300 MG capsule Commonly known as: NEURONTIN Take 300 mg by mouth daily as needed (pain, muscle spasms).   hydrochlorothiazide 12.5 MG tablet Commonly known as: HYDRODIURIL Take 12.5 mg by mouth daily as needed (high BP).   latanoprost 0.005 % ophthalmic solution Commonly known as: XALATAN Place 1 drop into both eyes at bedtime.   levothyroxine 100 MCG tablet Commonly known as: Synthroid Take 1 tablet (100 mcg total) by mouth daily before breakfast.   losartan 50 MG tablet Commonly known as: COZAAR Take 1 tablet (50 mg total) by mouth daily.   metFORMIN 500 MG tablet Commonly known as: GLUCOPHAGE Take 250 mg by mouth in the morning, at noon, and at bedtime.   metroNIDAZOLE 500 MG tablet Commonly known as: Flagyl Take 1 tablet (500 mg total) by mouth 3 (three) times daily for 5 days.   mirtazapine 30 MG tablet Commonly known as: REMERON Take 30 mg by mouth at bedtime.   nitroGLYCERIN 0.4 MG SL tablet Commonly known as: NITROSTAT Place 1 tablet (0.4 mg total) under the tongue every 5 (five) minutes as needed for chest pain.   omeprazole 20 MG capsule Commonly known as: PRILOSEC TAKE ONE CAPSULE BY MOUTH EVERY DAY   polyethylene glycol 17 g packet Commonly known as: MIRALAX / GLYCOLAX Take 17 g by mouth 2 (two) times daily as needed.   potassium chloride SA 20 MEQ tablet Commonly known as: KLOR-CON M TAKE 1 TABLET(20 MEQ) BY MOUTH DAILY WITH LASIX AND FUROSEMIDE AS NEEDED What changed: See the new instructions.   propranolol 10 MG tablet Commonly known as: INDERAL Take 5 mg  by mouth 3 (three) times daily.   rosuvastatin 10 MG tablet Commonly known as: CRESTOR Take 10 mg by mouth daily.   senna 8.6 MG Tabs tablet Commonly known as: SENOKOT Take 2 tablets (17.2 mg total) by mouth daily as needed for moderate constipation.   SYSTANE OP Place 1 drop into both eyes as needed (irritation).   traMADol 50 MG tablet Commonly known as: ULTRAM Take 1 tablet (50 mg total) by mouth every 6 (six) hours as needed (breakthrough pain).          Follow-up Information     [facility] Follow up.   Specialty: Home Health Services Why: [facility] will continue to provide home health services. Contact information: [address] [place] [address] [phone]         Fritzi Mandes, MD. Go on 02/20/2024.   Specialty: General Surgery Why: 9:40 AM, please arrive 20 min prior to appointment time to check in. Contact information: [address] [place] [address] [phone]  Serena Croissant, MD Follow up.   Specialty: Hematology and Oncology Why: Call to reschedule your appointment. Contact information: [address] [place] [address] [phone]         Jackie Plum, MD Follow up.   Specialty: Internal Medicine Why: For repeat labs in one week to check your kidney function Contact information: [address] [place] [address] [phone]                 Signed: Leary Roca, [facility] 02/13/2024, 9:09 AM Please see Amion for pager number during day hours 7:00am-4:30pm

## 2024-02-13 NOTE — Progress Notes (Signed)
  Progress Note   Date: 02/13/2024  Patient Name: [name]        MRN#: [id number]  Clarification of diagnosis:  Wound infection is clinically supported and the clinical data/criteria used for diagnosing is ______.

## 2024-02-13 NOTE — Progress Notes (Signed)
  Progress Note   Date: 02/13/2024  Patient Name: [name]        MRN#: [id number]  Review the patient's clinical findings supports the diagnosis of:   AKI

## 2024-02-16 LAB — CULTURE, BLOOD (ROUTINE X 2)
Culture: NO GROWTH
Culture: NO GROWTH

## 2024-02-19 LAB — MOLECULAR PATHOLOGY

## 2024-02-20 ENCOUNTER — Other Ambulatory Visit

## 2024-02-20 ENCOUNTER — Ambulatory Visit: Admitting: Hematology

## 2024-02-21 ENCOUNTER — Telehealth: Payer: Self-pay

## 2024-02-26 ENCOUNTER — Encounter: Payer: Self-pay | Admitting: Hematology

## 2024-02-26 ENCOUNTER — Inpatient Hospital Stay: Attending: Hematology and Oncology | Admitting: Hematology

## 2024-02-26 ENCOUNTER — Inpatient Hospital Stay

## 2024-02-26 VITALS — BP 142/80 | HR 87 | Temp 97.9°F | Resp 20 | Ht 61.0 in | Wt 130.2 lb

## 2024-02-26 DIAGNOSIS — C182 Malignant neoplasm of ascending colon: Secondary | ICD-10-CM | POA: Insufficient documentation

## 2024-02-26 DIAGNOSIS — C180 Malignant neoplasm of cecum: Secondary | ICD-10-CM

## 2024-02-26 DIAGNOSIS — D473 Essential (hemorrhagic) thrombocythemia: Secondary | ICD-10-CM | POA: Diagnosis not present

## 2024-02-26 DIAGNOSIS — D75839 Thrombocytosis, unspecified: Secondary | ICD-10-CM | POA: Diagnosis not present

## 2024-02-26 NOTE — Addendum Note (Signed)
 Addended by: Sonja [place] on: 02/26/2024 05:45 PM   Modules accepted: Orders

## 2024-02-26 NOTE — Progress Notes (Addendum)
 [name]   [phone] [phone]   Clinic New Consult Note   Patient Care Team: Tretha Fu, MD as PCP - General (Internal Medicine) Marry Skains, RN as [facility] Care Management 02/26/2024  CHIEF COMPLAINTS/PURPOSE OF CONSULTATION:  Recently diagnosed colon cancer  REFERRING PHYSICIAN: Dr. Lee [facility]   Discussed the use of AI scribe software for clinical note transcription with the patient, who gave verbal consent to proceed.  History of Present Illness The patient, an 88 year old female with a history of hypertension, prediabetes, thyroid  disease, and essential thrombocythemia, presents for a new consult of colon cancer.  She was referred by my partner [name], who is treating her ET.  She presents to the clinic with her daughter today.    Patient presented to [facility] with abdominal pain, nausea, vomiting on January 17, 2024, A CT scan revealed a large mass in the cecum with small bowel obstruction, leading to immediate surgery next day. The patient has not had a colonoscopy prior to this event.  She tolerated right hemicolectomy well, did have wound infection and was hospitalized again on February 11, 2024.  She is followed up with her surgeon [name], the wound is healing well. The patient's recovery has been otherwise uneventful, with regular bowel movements and no blood in the stool. The patient's appetite has improved, and she has been managing to walk on her own.  The patient has a history of gallbladder removal and is currently being treated for high platelet count. She has no family history of malignancy, does not drink alcohol, and has never smoked. The patient has nine children and requires assistance with self-care.     MEDICAL HISTORY:  Past Medical History:  Diagnosis Date   CHF (congestive heart failure) (HCC)    Hypertension    Pre-diabetes    Thrombocytosis    Thyroid  disease     SURGICAL HISTORY: Past Surgical History:   Procedure Laterality Date   CHOLECYSTECTOMY     LAPAROTOMY N/A 01/18/2024   Procedure: LAPAROTOMY, EXPLORATORY;  Surgeon: Lujean Sake, MD;  Location: [facility] OR;  Service: General;  Laterality: N/A;   PARTIAL COLECTOMY N/A 01/18/2024   Procedure: COLECTOMY, PARTIAL;  Surgeon: Lujean Sake, MD;  Location: [facility] OR;  Service: General;  Laterality: N/A;    SOCIAL HISTORY: Social History   Socioeconomic History   Marital status: Widowed    Spouse name: Not on file   Number of children: 9   Years of education: Not on file   Highest education level: Not on file  Occupational History   Not on file  Tobacco Use   Smoking status: Never   Smokeless tobacco: Never  Vaping Use   Vaping status: Never Used  Substance and Sexual Activity   Alcohol use: No   Drug use: Never   Sexual activity: Never  Other Topics Concern   Not on file  Social History Narrative   Not on file   Social Drivers of Amanda   Financial Resource Strain: Not on file  Food Insecurity: No Food Insecurity (02/11/2024)   Hunger Vital Sign    Worried About Running Out of Food in the Last Year: Never true    Ran Out of Food in the Last Year: Never true  Transportation Needs: No Transportation Needs (02/11/2024)   PRAPARE - Administrator, Civil Service (Medical): No    Lack of Transportation (Non-Medical): No  Physical Activity: Not on file  Stress: Not on file  Social Connections: Unknown (02/12/2024)   Social Connection and Isolation Panel [NHANES]    Frequency of Communication with Friends and Family: Three times a week    Frequency of Social Gatherings with Friends and Family: Three times a week    Attends Religious Services: More than 4 times per year    Active Member of Clubs or Organizations: No    Attends Banker Meetings: Never    Marital Status: Patient declined  Intimate Partner Violence: Not At Risk (02/11/2024)   Humiliation, Afraid, Rape, and Kick questionnaire    Fear of Current  or Ex-Partner: No    Emotionally Abused: No    Physically Abused: No    Sexually Abused: No    FAMILY HISTORY: Family History  Problem Relation Age of Onset   Hypertension Mother    Hypertension Father     ALLERGIES:  is allergic to penicillin g and shellfish allergy.  MEDICATIONS:  Current Outpatient Medications  Medication Sig Dispense Refill   acetaminophen  (TYLENOL ) 500 MG tablet Take 500 mg by mouth every 6 (six) hours as needed for mild pain (pain score 1-3) or moderate pain (pain score 4-6).     amLODipine  (NORVASC ) 5 MG tablet Take 5 mg by mouth at bedtime.     anagrelide  (AGRYLIN) 0.5 MG capsule Take 2 capsules (1 mg) by mouth 2 times daily. 360 capsule 3   docusate sodium  (COLACE) 100 MG capsule Take 1 capsule (100 mg total) by mouth 2 (two) times daily as needed for mild constipation.     folic acid  (FOLVITE ) 1 MG tablet Take 1 mg by mouth daily.  5   furosemide  (LASIX ) 20 MG tablet Take 1 tablet (20 mg total) by mouth as needed. (Patient taking differently: Take 20 mg by mouth as needed for fluid or edema.) 90 tablet 3   gabapentin (NEURONTIN) 300 MG capsule Take 300 mg by mouth daily as needed (pain, muscle spasms).     hydrochlorothiazide  (HYDRODIURIL ) 12.5 MG tablet Take 12.5 mg by mouth daily as needed (high BP).     latanoprost  (XALATAN ) 0.005 % ophthalmic solution Place 1 drop into both eyes at bedtime.     levothyroxine  (SYNTHROID ) 100 MCG tablet Take 1 tablet (100 mcg total) by mouth daily before breakfast. 30 tablet 3   losartan  (COZAAR ) 50 MG tablet Take 1 tablet (50 mg total) by mouth daily. 90 tablet 3   metFORMIN (GLUCOPHAGE) 500 MG tablet Take 250 mg by mouth in the morning, at noon, and at bedtime.     mirtazapine  (REMERON ) 30 MG tablet Take 30 mg by mouth at bedtime.     nitroGLYCERIN  (NITROSTAT ) 0.4 MG SL tablet Place 1 tablet (0.4 mg total) under the tongue every 5 (five) minutes as needed for chest pain. 90 tablet 3   omeprazole  (PRILOSEC ) 20 MG capsule  TAKE ONE CAPSULE BY MOUTH EVERY DAY (Patient taking differently: Take 20 mg by mouth daily.) 90 capsule 3   Polyethyl Glycol-Propyl Glycol (SYSTANE OP) Place 1 drop into both eyes as needed (irritation).     polyethylene glycol (MIRALAX  / GLYCOLAX ) 17 g packet Take 17 g by mouth 2 (two) times daily as needed.     potassium chloride  SA (KLOR-CON ) 20 MEQ tablet TAKE 1 TABLET(20 MEQ) BY MOUTH DAILY WITH LASIX  AND FUROSEMIDE  AS NEEDED (Patient taking differently: Take 20 mEq by mouth daily as needed (when taking lasix ).) 90 tablet 3   propranolol  (INDERAL ) 10 MG tablet Take 5 mg by mouth 3 (three)  times daily.     rosuvastatin  (CRESTOR ) 10 MG tablet Take 10 mg by mouth daily.     senna (SENOKOT) 8.6 MG TABS tablet Take 2 tablets (17.2 mg total) by mouth daily as needed for moderate constipation. 30 each 0   traMADol  (ULTRAM ) 50 MG tablet Take 1 tablet (50 mg total) by mouth every 6 (six) hours as needed (breakthrough pain). 10 tablet 0   vitamin B-12 (CYANOCOBALAMIN) 1000 MCG tablet Take 1,000 mcg by mouth daily.     No current facility-administered medications for this visit.    REVIEW OF SYSTEMS:   Constitutional: Denies fevers, chills or abnormal night sweats Eyes: Denies blurriness of vision, double vision or watery eyes Ears, nose, mouth, throat, and face: Denies mucositis or sore throat Respiratory: Denies cough, dyspnea or wheezes Cardiovascular: Denies palpitation, chest discomfort or lower extremity swelling Gastrointestinal:  Denies nausea, heartburn or change in bowel habits Skin: Denies abnormal skin rashes Lymphatics: Denies new lymphadenopathy or easy bruising Neurological:Denies numbness, tingling or new weaknesses Behavioral/Psych: Mood is stable, no new changes  All other systems were reviewed with the patient and are negative.  PHYSICAL EXAMINATION: ECOG PERFORMANCE STATUS: 2 - Symptomatic, <50% confined to bed  Vitals:   02/26/24 0921  BP: (!) 142/80  Pulse: 87  Resp:  20  Temp: 97.9 F (36.6 C)  SpO2: 95%   Filed Weights   02/26/24 0921  Weight: 130 lb 3.2 oz (59.1 kg)    GENERAL:alert, no distress and comfortable SKIN: skin color, texture, turgor are normal, no rashes or significant lesions EYES: normal, conjunctiva are pink and non-injected, sclera clear OROPHARYNX:no exudate, no erythema and lips, buccal mucosa, and tongue normal  NECK: supple, thyroid  normal size, non-tender, without nodularity LYMPH:  no palpable lymphadenopathy in the cervical, axillary or inguinal LUNGS: clear to auscultation and percussion with normal breathing effort HEART: regular rate & rhythm and no murmurs and no lower extremity edema ABDOMEN:abdomen soft, non-tender and normal bowel sounds Musculoskeletal:no cyanosis of digits and no clubbing  PSYCH: alert & oriented x 3 with fluent speech NEURO: no focal motor/sensory deficits  Physical Exam ABDOMEN: Abdominal incision healed  LABORATORY DATA:  I have reviewed the data as listed    Latest Ref Rng & Units 02/12/2024    6:04 AM 02/11/2024    6:20 AM 02/11/2024    2:20 AM  CBC  WBC 4.0 - 10.5 K/uL 6.9  6.9  6.4   Hemoglobin 12.0 - 15.0 g/dL 16.1  9.7  09.6   Hematocrit 36.0 - 46.0 % 33.4  30.7  39.6   Platelets 150 - 400 K/uL 286  253  207     @cmpl @  RADIOGRAPHIC STUDIES: I have personally reviewed the radiological images as listed and agreed with the findings in the report. CT ABDOMEN PELVIS WO CONTRAST Result Date: 02/11/2024 CLINICAL DATA:  Laparotomy and partial colectomy 01/17/2024 for colonic adenocarcinoma in the cecum/ascending colon. Presents with wound infection. EXAM: CT ABDOMEN AND PELVIS WITHOUT CONTRAST TECHNIQUE: Multidetector CT imaging of the abdomen and pelvis was performed following the standard protocol without IV contrast. RADIATION DOSE REDUCTION: This exam was performed according to the departmental dose-optimization program which includes automated exposure control, adjustment of the mA  and/or kV according to patient size and/or use of iterative reconstruction technique. COMPARISON:  Postoperative CT with IV contrast 01/25/2024, preoperative CT with IV contrast 01/17/2024. FINDINGS: Lower chest: There are bilateral trace pleural effusions, improved. There is posterior basal atelectasis in the lower lobes without  infiltrates. Moderate panchamber cardiomegaly is again noted. Small pericardial effusion increased. Hepatobiliary: There is no focal liver abnormality without contrast. Status post cholecystectomy with again noted intrahepatic and extrahepatic biliary dilatation, common bile duct again 13 mm. No filling defect is seen in the duct. Pancreas: Partially atrophic. No focal abnormality without contrast. Spleen: Hypodense splenic lesions are again noted, largest 2.4 cm medially, indeterminate. Others are smallerbut with similar imaging features. MRI without and with contrast is recommended when clinically feasible. Adrenals/Urinary Tract: Stable small bilateral Bosniak 1 renal cysts. Stable 1.2 cm right adrenal adenoma, Hounsfield density is 5.5. No follow-up imaging is required.  No left adrenal lesion is seen. No new contour deforming renal abnormality is evident and no increased perinephric edema. There is no urinary stone or obstruction. There is increased bladder thickening versus underdistention. Correlate with urinalysis for potential cystitis. Stomach/Bowel: Unremarkable contracted stomach. Normal caliber of the small bowel. Right hemicolectomy with primary surgical ileocolic anastomosis in the right lower quadrant. There is increased moderate fecal stasis in the transverse and proximal descending colon, fecal back up into the distal ileum. No upstream mechanical obstruction is seen. No findings of acute colitis or diverticulitis. Vascular/Lymphatic: Aortic atherosclerosis. No enlarged abdominal or pelvic lymph nodes. Reproductive: Uterus and bilateral adnexa are unremarkable. Other:  Patchy mesenteric edema continues to be seen abdomen and upper pelvis. This could be congestive or inflammatory. Laparotomy skin staples interval removed. Skin thickening and subcutaneous stranding extending increasingly over the mid to lower abdominal wall anteriorly and could be due to third spacing of fluid or inflammation such as cellulitis. There is no discrete fluid collection. Supraumbilical anterior wall hernia previously contained nonobstructed small bowel segment now contains only fluid. There is mild scattered fluid in the mesenteric folds, small amount in the subhepatic space, small amount in the pelvis, less than previously. There is no free air, abscess or free hemorrhage. Musculoskeletal: There is generalized bone marrow heterogeneity without a discrete focal lytic or blastic lesion. This was also seen previously. No fracture is evident. IMPRESSION: 1. Skin thickening and subcutaneous stranding extending increasingly over the mid to lower abdominal wall anteriorly and could be due to third spacing of fluid or inflammation such as cellulitis. No discrete fluid collection is seen. 2. Supraumbilical anterior wall hernia previously contained nonobstructed small bowel now contains only fluid. 3. Increased fecal stasis in the transverse and proximal descending colon, with fecal back up into the distal ileum. No upstream mechanical obstruction. 4. Patchy mesenteric edema continues to be seen abdomen and upper pelvis. This could be congestive or inflammatory. 5. Increased bladder thickening versus underdistention. Correlate with urinalysis for potential cystitis. 6. Hypodense splenic lesions, largest 2.4 cm medially, indeterminate. MRI without and with contrast is recommended when clinically feasible. 7. Stable 1.2 cm right adrenal adenoma. No follow-up imaging is required. 8. Aortic atherosclerosis. 9. Cardiomegaly with small pericardial effusion increased. 10. Trace pleural effusions, improved. 11.  Generalized bone marrow heterogeneity without a discrete focal lytic or blastic lesion. This was also seen previously. Electronically Signed   By: Denman [facility] M.D.   On: 02/11/2024 04:16   DG Chest Portable 1 View Result Date: 02/11/2024 CLINICAL DATA:  Possible infection EXAM: PORTABLE CHEST 1 VIEW COMPARISON:  01/24/2024 FINDINGS: Cardiomegaly with possible small pleural effusions. Streaky bibasilar airspace disease. No pneumothorax IMPRESSION: Cardiomegaly with possible small pleural effusions. Streaky bibasilar airspace disease, atelectasis versus pneumonia. Electronically Signed   By: Esmeralda [facility] M.D.   On: 02/11/2024 01:30    ASSESSMENT & PLAN:  88 year old female with past medical history of hypertension, hypothyroidism, diabetes, essential thrombocythemia, presented with small bowel obstruction. 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 Tumor was 13 cm, moderate grade (grade 2), and T3N0M0. Tumor was completely resected with negative surgical margins and no lymphovascular invasion. 13 lymph nodes were negative for metastasis.  No bowel perforation.  MSI-high disease, indicating a good prognosis and potential good response to immunotherapy if recurrence occurs. No Lynch syndrome detected, indicating sporadic colon cancer. Overall prognosis is good with around 20% chance of recurrence. Immunotherapy is an option if recurrence occurs, especially given her age and the MSI-high status - I did not recommend adjuvant chemotherapy given her overall good prognosis and her advanced age - Recommend annual CT scan for surveillance for 2 to 3 years - Follow up every 3-4 months for blood work to monitor for recurrence -Will obtain CT chest without contrast to complete staging -She did not have colonoscopy before surgery, due to her advanced age and limited performance status, okay to hold it for now. - Discuss follow-up plan with family and decide  whether to consolidate care with hematology/oncology  Essential thrombocythemia Thrombocytosis managed with anagrelide . Follow-up with hematology/oncology is suggested to manage both thrombocytosis and colon cancer surveillance. - Consider consolidating care with hematology/oncology for management of thrombocytosis and colon cancer surveillance  Plan [facility] course and surgical pathology reviewed with patient and her daughter. - Given her stage II disease, MSI high, overall good prognosis, and her advanced age, I do not recommend adjuvant chemotherapy -ct chest to complete staging  - I reviewed plan for cancer surveillance - Patient and her daughter wants to follow-up with [name] on 03/10/2024 then decide who to f/u in future.      No orders of the defined types were placed in this encounter.   All questions were answered. The patient knows to call the clinic with any problems, questions or concerns. I spent 35 minutes counseling the patient face to face. The total time spent in the appointment was 45 minutes and more than 50% was on counseling.     Amanda [place], MD 02/26/2024 5:33 PM

## 2024-02-28 ENCOUNTER — Ambulatory Visit: Admitting: Hematology

## 2024-02-28 ENCOUNTER — Telehealth: Payer: Self-pay

## 2024-02-28 ENCOUNTER — Encounter: Payer: Self-pay | Admitting: Hematology and Oncology

## 2024-02-28 ENCOUNTER — Other Ambulatory Visit

## 2024-02-28 NOTE — Progress Notes (Signed)
 PATIENT NAVIGATOR PROGRESS NOTE  Name: [name] Date: 02/28/2024 MRN: [id number]  DOB: 02-Aug-1936   Reason for visit:  First visit Phone call follow-up  Comments:  Called patient  on 4/23 for follow-up from her initial appt with Dr. Maryalice Smaller on 4/22.  Dr. Maryalice Smaller had requested for patient to get a CT scan for staging purposes prior to her next appointment with Dr. Lee Public.  Patient's daughter stated she would need to discuss this with the patient and also with her siblings, as patient has expressed not wanting to pursue treatment.  Called and spoke to patient's daughter on 4/24 for follow-up.  Per patient's daughter, patient has asked to wait on getting scan until she sees Dr. Lee Public on 5/5.  Both Dr. Maryalice Smaller and Dr. Lee Public made aware of patient's wishes.    Time spent counseling/coordinating care: 45-60 minutes

## 2024-03-06 ENCOUNTER — Telehealth: Payer: Self-pay

## 2024-03-06 NOTE — Transitions of Care (Post Inpatient/ED Visit) (Signed)
 03/06/2024  Patient ID: [name], female   DOB: 01-28-1936, 88 y.o.   MRN: [id number]  Medication Review for transitions of care.  See [facility] for documentation.  Mohamad Bruso J. Joshual Terrio RN, MSN [facility], [facility] RN Care Manager Direct Dial: [phone]  Fax: [phone] Website: [email or web address]

## 2024-03-10 ENCOUNTER — Inpatient Hospital Stay: Payer: Medicare (Managed Care) | Attending: Hematology and Oncology

## 2024-03-10 ENCOUNTER — Inpatient Hospital Stay (HOSPITAL_BASED_OUTPATIENT_CLINIC_OR_DEPARTMENT_OTHER): Payer: Medicare (Managed Care) | Admitting: Hematology and Oncology

## 2024-03-10 VITALS — BP 144/88 | HR 71 | Temp 97.6°F | Resp 15 | Wt 129.4 lb

## 2024-03-10 DIAGNOSIS — D509 Iron deficiency anemia, unspecified: Secondary | ICD-10-CM | POA: Insufficient documentation

## 2024-03-10 DIAGNOSIS — D75839 Thrombocytosis, unspecified: Secondary | ICD-10-CM | POA: Diagnosis not present

## 2024-03-10 DIAGNOSIS — D473 Essential (hemorrhagic) thrombocythemia: Secondary | ICD-10-CM | POA: Insufficient documentation

## 2024-03-10 DIAGNOSIS — C182 Malignant neoplasm of ascending colon: Secondary | ICD-10-CM | POA: Insufficient documentation

## 2024-03-10 LAB — IRON AND IRON BINDING CAPACITY (CC-WL,HP ONLY)
Iron: 46 ug/dL (ref 28–170)
Saturation Ratios: 20 % (ref 10.4–31.8)
TIBC: 232 ug/dL — ABNORMAL LOW (ref 250–450)
UIBC: 186 ug/dL (ref 148–442)

## 2024-03-10 LAB — CBC WITH DIFFERENTIAL (CANCER CENTER ONLY)
Abs Immature Granulocytes: 0.02 10*3/uL (ref 0.00–0.07)
Basophils Absolute: 0.1 10*3/uL (ref 0.0–0.1)
Basophils Relative: 1 %
Eosinophils Absolute: 0.4 10*3/uL (ref 0.0–0.5)
Eosinophils Relative: 6 %
HCT: 31.6 % — ABNORMAL LOW (ref 36.0–46.0)
Hemoglobin: 10 g/dL — ABNORMAL LOW (ref 12.0–15.0)
Immature Granulocytes: 0 %
Lymphocytes Relative: 31 %
Lymphs Abs: 1.9 10*3/uL (ref 0.7–4.0)
MCH: 26.1 pg (ref 26.0–34.0)
MCHC: 31.6 g/dL (ref 30.0–36.0)
MCV: 82.5 fL (ref 80.0–100.0)
Monocytes Absolute: 0.8 10*3/uL (ref 0.1–1.0)
Monocytes Relative: 13 %
Neutro Abs: 3 10*3/uL (ref 1.7–7.7)
Neutrophils Relative %: 49 %
Platelet Count: 298 10*3/uL (ref 150–400)
RBC: 3.83 MIL/uL — ABNORMAL LOW (ref 3.87–5.11)
RDW: 19 % — ABNORMAL HIGH (ref 11.5–15.5)
WBC Count: 6.1 10*3/uL (ref 4.0–10.5)
nRBC: 0 % (ref 0.0–0.2)

## 2024-03-10 LAB — CMP (CANCER CENTER ONLY)
ALT: 21 U/L (ref 0–44)
AST: 27 U/L (ref 15–41)
Albumin: 3.8 g/dL (ref 3.5–5.0)
Alkaline Phosphatase: 68 U/L (ref 38–126)
Anion gap: 3 — ABNORMAL LOW (ref 5–15)
BUN: 26 mg/dL — ABNORMAL HIGH (ref 8–23)
CO2: 34 mmol/L — ABNORMAL HIGH (ref 22–32)
Calcium: 9.6 mg/dL (ref 8.9–10.3)
Chloride: 100 mmol/L (ref 98–111)
Creatinine: 1.23 mg/dL — ABNORMAL HIGH (ref 0.44–1.00)
GFR, Estimated: 43 mL/min — ABNORMAL LOW (ref >60)
Glucose, Bld: 90 mg/dL (ref 70–99)
Potassium: 4.4 mmol/L (ref 3.5–5.1)
Sodium: 137 mmol/L (ref 135–145)
Total Bilirubin: 0.4 mg/dL (ref 0.0–1.2)
Total Protein: 8 g/dL (ref 6.5–8.1)

## 2024-03-10 LAB — FERRITIN: Ferritin: 570 ng/mL — ABNORMAL HIGH (ref 11–307)

## 2024-03-10 NOTE — Assessment & Plan Note (Signed)
 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   Current treatment: Anagrelide  0.5 mg daily started 04/08/2018   Lab review  05/14/2018: Platelet count 588 12/02/2018: Platelet count 1033 12/30/2018: Platelet count: 587 08/23/20: Platelet count 687 05/23/2022: Platelet count 343, hemoglobin 9.1 with MCV 78 06/14/2023: Platelet count 521, hemoglobin 9.7 09/04/2023: Platelets 394, hemoglobin 10.1   Iron  deficiency anemia: IV iron : July 2023, September 2024  She travels back and forth from [place]  Stage II colon cancer: T3 N0 M0 13 cm cecal mass status post surgery, 13 lymph nodes negative, MSI high indicating good prognosis.  No Lynch syndrome. Dr.Feng is following the patient and she would need routine follow-ups with her.  I recommended that she can follow-up with Dr.Feng for both the essential thrombocytosis and her colon cancer surveillance.

## 2024-03-10 NOTE — Progress Notes (Signed)
 Patient Care Team: Tretha Fu, MD as PCP - General (Internal Medicine) Leath, Dionne, RN as [facility] Gerald Kitty, Isiah Mare, RN as Oncology Nurse Navigator  DIAGNOSIS:  Encounter Diagnoses  Name Primary?   Essential thrombocytosis (HCC) Yes   Cancer of right colon (HCC)     SUMMARY OF ONCOLOGIC HISTORY: Oncology History  Cancer of right colon (HCC)  02/26/2024 Initial Diagnosis   Cancer of right colon (HCC)   02/26/2024 Cancer Staging   Staging form: Colon and Rectum, AJCC 8th Edition - Pathologic: Stage IIA (pT3, pN0, cM0) - Signed by Sonja Pomeroy, MD on 02/26/2024 Total positive nodes: 0 Histologic grading system: 4 grade system Histologic grade (G): G2 Residual tumor (R): R0     CHIEF COMPLIANT: Follow-up of essential thrombocytosis and recent history of colon cancer  HISTORY OF PRESENT ILLNESS:  History of Present Illness [name] is an 88 year old female with colon cancer who presents for follow-up after recent surgery. She was referred by Dr. Carlye Child for follow-up after gastrointestinal surgery.  She experiences low appetite and low energy levels since her surgery. Her energy levels were low even prior to the surgery, attributed to anemia. Her hemoglobin levels dropped to 9.2 g/dL during her hospitalization in March and have improved to 10 g/dL. She is not on any iron  supplements.   She engages in regular walking with her daughter.     ALLERGIES:  is allergic to penicillin g and shellfish allergy.  MEDICATIONS:  Current Outpatient Medications  Medication Sig Dispense Refill   acetaminophen  (TYLENOL ) 500 MG tablet Take 500 mg by mouth every 6 (six) hours as needed for mild pain (pain score 1-3) or moderate pain (pain score 4-6).     amLODipine  (NORVASC ) 5 MG tablet Take 5 mg by mouth at bedtime.     anagrelide  (AGRYLIN) 0.5 MG capsule Take 2 capsules (1 mg) by mouth 2 times daily. 360 capsule 3   docusate sodium  (COLACE) 100 MG capsule Take 1 capsule  (100 mg total) by mouth 2 (two) times daily as needed for mild constipation.     folic acid  (FOLVITE ) 1 MG tablet Take 1 mg by mouth daily.  5   furosemide  (LASIX ) 20 MG tablet Take 1 tablet (20 mg total) by mouth as needed. (Patient taking differently: Take 20 mg by mouth as needed for fluid or edema.) 90 tablet 3   gabapentin (NEURONTIN) 300 MG capsule Take 300 mg by mouth daily as needed (pain, muscle spasms).     hydrochlorothiazide  (HYDRODIURIL ) 12.5 MG tablet Take 12.5 mg by mouth daily as needed (high BP).     latanoprost  (XALATAN ) 0.005 % ophthalmic solution Place 1 drop into both eyes at bedtime.     levothyroxine  (SYNTHROID ) 100 MCG tablet Take 1 tablet (100 mcg total) by mouth daily before breakfast. 30 tablet 3   losartan  (COZAAR ) 50 MG tablet Take 1 tablet (50 mg total) by mouth daily. 90 tablet 3   metFORMIN (GLUCOPHAGE) 500 MG tablet Take 250 mg by mouth in the morning, at noon, and at bedtime.     mirtazapine  (REMERON ) 30 MG tablet Take 30 mg by mouth at bedtime.     nitroGLYCERIN  (NITROSTAT ) 0.4 MG SL tablet Place 1 tablet (0.4 mg total) under the tongue every 5 (five) minutes as needed for chest pain. 90 tablet 3   omeprazole  (PRILOSEC ) 20 MG capsule TAKE ONE CAPSULE BY MOUTH EVERY DAY (Patient taking differently: Take 20 mg by mouth daily.) 90 capsule 3  Polyethyl Glycol-Propyl Glycol (SYSTANE OP) Place 1 drop into both eyes as needed (irritation).     polyethylene glycol (MIRALAX  / GLYCOLAX ) 17 g packet Take 17 g by mouth 2 (two) times daily as needed.     potassium chloride  SA (KLOR-CON ) 20 MEQ tablet TAKE 1 TABLET(20 MEQ) BY MOUTH DAILY WITH LASIX  AND FUROSEMIDE  AS NEEDED (Patient taking differently: Take 20 mEq by mouth daily as needed (when taking lasix ).) 90 tablet 3   propranolol  (INDERAL ) 10 MG tablet Take 5 mg by mouth 3 (three) times daily.     rosuvastatin  (CRESTOR ) 10 MG tablet Take 10 mg by mouth daily.     senna (SENOKOT) 8.6 MG TABS tablet Take 2 tablets (17.2 mg  total) by mouth daily as needed for moderate constipation. 30 each 0   traMADol  (ULTRAM ) 50 MG tablet Take 1 tablet (50 mg total) by mouth every 6 (six) hours as needed (breakthrough pain). 10 tablet 0   vitamin B-12 (CYANOCOBALAMIN) 1000 MCG tablet Take 1,000 mcg by mouth daily.     No current facility-administered medications for this visit.    PHYSICAL EXAMINATION: ECOG PERFORMANCE STATUS: 1 - Symptomatic but completely ambulatory  Vitals:   03/10/24 1424  BP: (!) 144/88  Pulse: 71  Resp: 15  Temp: 97.6 F (36.4 C)  SpO2: 97%   Filed Weights   03/10/24 1424  Weight: 129 lb 6.4 oz (58.7 kg)      LABORATORY DATA:  I have reviewed the data as listed    Latest Ref Rng & Units 03/10/2024    2:00 PM 02/13/2024    6:07 AM 02/12/2024    1:07 PM  CMP  Glucose 70 - 99 mg/dL 90  82  960   BUN 8 - 23 mg/dL 26  15  17    Creatinine 0.44 - 1.00 mg/dL 4.54  0.98  1.19   Sodium 135 - 145 mmol/L 137  141  141   Potassium 3.5 - 5.1 mmol/L 4.4  4.3  3.2   Chloride 98 - 111 mmol/L 100  105  104   CO2 22 - 32 mmol/L 34  26  27   Calcium  8.9 - 10.3 mg/dL 9.6  8.8  8.9   Total Protein 6.5 - 8.1 g/dL 8.0     Total Bilirubin 0.0 - 1.2 mg/dL 0.4     Alkaline Phos 38 - 126 U/L 68     AST 15 - 41 U/L 27     ALT 0 - 44 U/L 21       Lab Results  Component Value Date   WBC 6.1 03/10/2024   HGB 10.0 (L) 03/10/2024   HCT 31.6 (L) 03/10/2024   MCV 82.5 03/10/2024   PLT 298 03/10/2024   NEUTROABS 3.0 03/10/2024    ASSESSMENT & PLAN:  Essential thrombocytosis (HCC) 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   Current treatment: Anagrelide  0.5 mg daily started 04/08/2018   Lab review  05/14/2018: Platelet count 588 12/02/2018: Platelet count 1033 12/30/2018: Platelet count: 587 08/23/20: Platelet count 687 05/23/2022: Platelet count 343, hemoglobin 9.1 with MCV 78 06/14/2023: Platelet count 521, hemoglobin 9.7 09/04/2023: Platelets 394, hemoglobin  10.1 03/10/2024: Platelets 298, hemoglobin 10   Iron  deficiency anemia: IV iron : July 2023, September 2024  She travels back and forth from [place]  Stage II colon cancer: T3 N0 M0 13 cm cecal mass status post surgery, 13 lymph nodes negative, MSI  high indicating good prognosis.  No Lynch syndrome.    Patient wishes to follow-up with us  for both her colon cancer needs as well as for surveillance of essential thrombocytosis. I will set her up every 3 months with labs and follow-up.  I will call her with results of iron  studies and determine if she needs IV iron .    Assessment & Plan Stage IIa colon cancer post-surgery Stage IIa colon cancer post-surgery with no need for adjuvant chemotherapy. Good prognosis, no current evidence of disease. - Monitor colon cancer markers with periodic blood tests. - Schedule CT scan in one year.  Anemia Anemia likely secondary to blood loss from recent colon surgery. Hemoglobin improved from 9.2 g/dL to 10 g/dL. Iron  levels pending. - Review iron  levels once available. - Consider IV iron  infusion if iron  levels are low. - Recheck hemoglobin in 3 months.      Orders Placed This Encounter  Procedures   CBC with Differential (Cancer Center Only)    Standing Status:   Future    Expiration Date:   03/10/2025   Ferritin    Standing Status:   Future    Expiration Date:   03/10/2025   Iron  and Iron  Binding Capacity ([facility] only)    Standing Status:   Future    Expiration Date:   03/10/2025   CEA (Access)-[facility] ONLY    Standing Status:   Future    Expiration Date:   03/10/2025   The patient has a good understanding of the overall plan. she agrees with it. she will call with any problems that may develop before the next visit here. Total time spent: 30 mins including face to face time and time spent for planning, charting and co-ordination of care   Viinay K Vick Filter, MD 03/10/24

## 2024-03-13 ENCOUNTER — Other Ambulatory Visit: Payer: Self-pay

## 2024-03-13 DIAGNOSIS — E119 Type 2 diabetes mellitus without complications: Secondary | ICD-10-CM

## 2024-03-13 NOTE — Transitions of Care (Post Inpatient/ED Visit) (Signed)
 03/13/2024  Patient ID: [name], female   DOB: 02/12/1936, 88 y.o.   MRN: [id number]  Medication Review for transitions of care.  See [employer] for documentation.   Jonathandavid Marlett J. Tyshana Nishida RN, MSN [facility], [facility] RN Care Manager Direct Dial: [phone]  Fax: [phone] Website: [email or web address]

## 2024-03-13 NOTE — Transitions of Care (Post Inpatient/ED Visit) (Signed)
 03/13/2024  Patient ID: [name], female   DOB: [birth year], 88 y.o.   MRN: [id number]  Situation: Patient has completed 30-day TOC program.    Background:  Patient with recent small bowel obstruction due to mass.  Surgical intervention completed.  Patient readmitted due to wound infection.  Wound is healed.    Assessment: Patient has completed the transition of care program for recent hospitalization. However, patient has chronic conditions such as diabetes, HTN and CKD III and would benefit from Complex Care Management.  Patient and family agreeable.   Recommendation: RN CM referring to Complex Care Management.   Transitions of Care documentation in [employer].  Laylonie Marzec J. Randee Upchurch RN, MSN [facility], [facility] RN Care Manager Direct Dial: [phone]  Fax: [phone] Website: [email or web address]

## 2024-03-17 ENCOUNTER — Other Ambulatory Visit (HOSPITAL_COMMUNITY): Payer: Self-pay

## 2024-03-17 ENCOUNTER — Encounter: Payer: Self-pay | Admitting: Hematology and Oncology

## 2024-03-17 ENCOUNTER — Other Ambulatory Visit: Payer: Self-pay

## 2024-03-20 ENCOUNTER — Encounter: Payer: Self-pay | Admitting: Hematology and Oncology

## 2024-03-25 ENCOUNTER — Telehealth: Payer: Self-pay | Admitting: *Deleted

## 2024-03-25 ENCOUNTER — Encounter: Payer: Self-pay | Admitting: *Deleted

## 2024-03-28 ENCOUNTER — Encounter: Payer: Self-pay | Admitting: Hematology and Oncology

## 2024-04-01 ENCOUNTER — Telehealth: Payer: Self-pay

## 2024-04-01 NOTE — Progress Notes (Unsigned)
 Complex Care Management Care Guide Note  04/01/2024 Name: Bradie Sangiovanni MRN: [id number] DOB: [birth year]  Roshanna Cimino is a 88 y.o. year old female who is a primary care patient of Osei-Bonsu, Caretha Chapel, MD and is actively engaged with the care management team. I reached out to Jil Mosses by phone today to assist with re-scheduling  with the RN Case Manager.  Follow up plan: Unsuccessful telephone outreach attempt made. A HIPAA compliant phone message was left for the patient providing contact information and requesting a return call.  Creola Doheny [facility], [facility] Guide  Direct Dial: [phone]  Fax [phone]

## 2024-04-02 NOTE — Progress Notes (Unsigned)
 Complex Care Management Note Care Guide Note  04/02/2024 Name: [name] MRN: [id number] DOB: Nov 01, 1936   Complex Care Management Outreach Attempts: A second unsuccessful outreach was attempted today to offer the patient with information about available complex care management services.  Follow Up Plan:  Additional outreach attempts will be made to offer the patient complex care management information and services.   Encounter Outcome:  No Answer   Creola Doheny [facility], [facility] Guide  Direct Dial: [phone]  Fax [phone]

## 2024-06-10 ENCOUNTER — Inpatient Hospital Stay: Payer: Medicare (Managed Care) | Admitting: Hematology and Oncology

## 2024-06-10 ENCOUNTER — Inpatient Hospital Stay: Payer: Medicare (Managed Care)

## 2024-06-20 ENCOUNTER — Ambulatory Visit: Payer: Self-pay | Admitting: Cardiology

## 2024-07-08 ENCOUNTER — Inpatient Hospital Stay: Payer: Medicare (Managed Care) | Attending: Hematology and Oncology

## 2024-07-08 ENCOUNTER — Other Ambulatory Visit: Payer: Self-pay

## 2024-07-08 ENCOUNTER — Other Ambulatory Visit (HOSPITAL_BASED_OUTPATIENT_CLINIC_OR_DEPARTMENT_OTHER): Payer: Self-pay

## 2024-07-08 ENCOUNTER — Inpatient Hospital Stay (HOSPITAL_BASED_OUTPATIENT_CLINIC_OR_DEPARTMENT_OTHER): Payer: Medicare (Managed Care) | Admitting: Hematology and Oncology

## 2024-07-08 VITALS — BP 137/84 | HR 80 | Temp 97.9°F | Resp 16 | Wt 144.2 lb

## 2024-07-08 DIAGNOSIS — D75839 Thrombocytosis, unspecified: Secondary | ICD-10-CM | POA: Insufficient documentation

## 2024-07-08 DIAGNOSIS — D473 Essential (hemorrhagic) thrombocythemia: Secondary | ICD-10-CM

## 2024-07-08 DIAGNOSIS — C182 Malignant neoplasm of ascending colon: Secondary | ICD-10-CM | POA: Diagnosis not present

## 2024-07-08 DIAGNOSIS — D509 Iron deficiency anemia, unspecified: Secondary | ICD-10-CM | POA: Insufficient documentation

## 2024-07-08 LAB — CBC WITH DIFFERENTIAL (CANCER CENTER ONLY)
Abs Immature Granulocytes: 0.04 K/uL (ref 0.00–0.07)
Basophils Absolute: 0.1 K/uL (ref 0.0–0.1)
Basophils Relative: 1 %
Eosinophils Absolute: 0.4 K/uL (ref 0.0–0.5)
Eosinophils Relative: 6 %
HCT: 33 % — ABNORMAL LOW (ref 36.0–46.0)
Hemoglobin: 10.6 g/dL — ABNORMAL LOW (ref 12.0–15.0)
Immature Granulocytes: 1 %
Lymphocytes Relative: 27 %
Lymphs Abs: 1.8 K/uL (ref 0.7–4.0)
MCH: 26.8 pg (ref 26.0–34.0)
MCHC: 32.1 g/dL (ref 30.0–36.0)
MCV: 83.5 fL (ref 80.0–100.0)
Monocytes Absolute: 0.8 K/uL (ref 0.1–1.0)
Monocytes Relative: 13 %
Neutro Abs: 3.5 K/uL (ref 1.7–7.7)
Neutrophils Relative %: 52 %
Platelet Count: 374 K/uL (ref 150–400)
RBC: 3.95 MIL/uL (ref 3.87–5.11)
RDW: 17.2 % — ABNORMAL HIGH (ref 11.5–15.5)
WBC Count: 6.6 K/uL (ref 4.0–10.5)
nRBC: 0 % (ref 0.0–0.2)

## 2024-07-08 LAB — IRON AND IRON BINDING CAPACITY (CC-WL,HP ONLY)
Iron: 68 ug/dL (ref 28–170)
Saturation Ratios: 27 % (ref 10.4–31.8)
TIBC: 253 ug/dL (ref 250–450)
UIBC: 185 ug/dL (ref 148–442)

## 2024-07-08 LAB — CEA (ACCESS): CEA (CHCC): 4.79 ng/mL (ref 0.00–5.00)

## 2024-07-08 LAB — FERRITIN: Ferritin: 364 ng/mL — ABNORMAL HIGH (ref 11–307)

## 2024-07-08 MED ORDER — ANAGRELIDE HCL 1 MG PO CAPS
1.0000 mg | ORAL_CAPSULE | Freq: Two times a day (BID) | ORAL | 3 refills | Status: DC
  Filled 2024-07-08: qty 180, 90d supply, fill #0
  Filled 2024-08-26: qty 360, 180d supply, fill #1

## 2024-07-08 NOTE — Assessment & Plan Note (Signed)
 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   Current treatment: Anagrelide  0.5 mg daily started 04/08/2018   Lab review  05/14/2018: Platelet count 588 12/02/2018: Platelet count 1033 12/30/2018: Platelet count: 587 08/23/20: Platelet count 687 05/23/2022: Platelet count 343, hemoglobin 9.1 with MCV 78 06/14/2023: Platelet count 521, hemoglobin 9.7 09/04/2023: Platelets 394, hemoglobin 10.1 03/10/2024: Platelets 298, hemoglobin 10    Iron  deficiency anemia: IV iron : July 2023, September 2024  She travels back and forth from [place]   Stage II colon cancer: T3 N0 M0 13 cm cecal mass status post surgery, 13 lymph nodes negative, MSI high indicating good prognosis.  No Lynch syndrome.     Patient wishes to follow-up with us  for both her colon cancer needs as well as for surveillance of essential thrombocytosis. I will set her up every 3 months with labs and follow-up.

## 2024-07-08 NOTE — Progress Notes (Signed)
 Patient Care Team: Catalina Bare, MD as PCP - General (Internal Medicine) Ardis Evalene CROME, RN as Oncology Nurse Navigator  DIAGNOSIS:  Encounter Diagnosis  Name Primary?   Essential thrombocytosis (HCC) Yes    SUMMARY OF ONCOLOGIC HISTORY: Oncology History  Cancer of right colon (HCC)  02/26/2024 Initial Diagnosis   Cancer of right colon (HCC)   02/26/2024 Cancer Staging   Staging form: Colon and Rectum, AJCC 8th Edition - Pathologic: Stage IIA (pT3, pN0, cM0) - Signed by Lanny Callander, MD on 02/26/2024 Total positive nodes: 0 Histologic grading system: 4 grade system Histologic grade (G): G2 Residual tumor (R): R0     CHIEF COMPLIANT: Follow-up of essential thrombocytosis on anagrelide   HISTORY OF PRESENT ILLNESS:  History of Present Illness [name] is an 88 year old female with thrombocytosis who presents for routine follow-up.  Her platelet count is 374, increased from 298, while on anagrelide  1 mg twice daily. She tolerates the medication well. Her hemoglobin has improved from 10 to 10.6, though it remains low. She plans to travel at the end of the year and will be away for at least a year.     ALLERGIES:  is allergic to penicillin g and shellfish allergy.  MEDICATIONS:  Current Outpatient Medications  Medication Sig Dispense Refill   acetaminophen  (TYLENOL ) 500 MG tablet Take 500 mg by mouth every 6 (six) hours as needed for mild pain (pain score 1-3) or moderate pain (pain score 4-6).     amLODipine  (NORVASC ) 5 MG tablet Take 5 mg by mouth at bedtime.     docusate sodium  (COLACE) 100 MG capsule Take 1 capsule (100 mg total) by mouth 2 (two) times daily as needed for mild constipation.     folic acid  (FOLVITE ) 1 MG tablet Take 1 mg by mouth daily.  5   furosemide  (LASIX ) 20 MG tablet Take 1 tablet (20 mg total) by mouth as needed. 90 tablet 3   gabapentin (NEURONTIN) 300 MG capsule Take 300 mg by mouth daily as needed (pain, muscle spasms).      hydrochlorothiazide  (HYDRODIURIL ) 12.5 MG tablet Take 12.5 mg by mouth daily as needed (high BP).     latanoprost  (XALATAN ) 0.005 % ophthalmic solution Place 1 drop into both eyes at bedtime.     levothyroxine  (SYNTHROID ) 100 MCG tablet Take 1 tablet (100 mcg total) by mouth daily before breakfast. 30 tablet 3   losartan  (COZAAR ) 50 MG tablet Take 1 tablet (50 mg total) by mouth daily. 90 tablet 3   metFORMIN (GLUCOPHAGE) 500 MG tablet Take 250 mg by mouth in the morning, at noon, and at bedtime.     mirtazapine  (REMERON ) 30 MG tablet Take 30 mg by mouth at bedtime.     nitroGLYCERIN  (NITROSTAT ) 0.4 MG SL tablet Place 1 tablet (0.4 mg total) under the tongue every 5 (five) minutes as needed for chest pain. 90 tablet 3   omeprazole  (PRILOSEC ) 20 MG capsule TAKE ONE CAPSULE BY MOUTH EVERY DAY 90 capsule 3   Polyethyl Glycol-Propyl Glycol (SYSTANE OP) Place 1 drop into both eyes as needed (irritation).     polyethylene glycol (MIRALAX  / GLYCOLAX ) 17 g packet Take 17 g by mouth 2 (two) times daily as needed.     propranolol  (INDERAL ) 10 MG tablet Take 5 mg by mouth 3 (three) times daily.     rosuvastatin  (CRESTOR ) 10 MG tablet Take 10 mg by mouth daily.     senna (SENOKOT) 8.6 MG TABS tablet Take 2  tablets (17.2 mg total) by mouth daily as needed for moderate constipation. 30 each 0   vitamin B-12 (CYANOCOBALAMIN) 1000 MCG tablet Take 1,000 mcg by mouth daily.     anagrelide  (AGRYLIN) 1 MG capsule Take 1 capsule (1 mg total) by mouth 2 (two) times daily. 180 capsule 3   megestrol (MEGACE) 20 MG tablet Take 20 mg by mouth 3 (three) times daily. (Patient not taking: Reported on 07/08/2024)     potassium chloride  SA (KLOR-CON ) 20 MEQ tablet TAKE 1 TABLET(20 MEQ) BY MOUTH DAILY WITH LASIX  AND FUROSEMIDE  AS NEEDED (Patient not taking: Reported on 07/08/2024) 90 tablet 3   No current facility-administered medications for this visit.    PHYSICAL EXAMINATION: ECOG PERFORMANCE STATUS: 1 - Symptomatic but  completely ambulatory  Vitals:   07/08/24 1047 07/08/24 1051  BP: (!) 159/86 137/84  Pulse: 80   Resp: 16   Temp: 97.9 F (36.6 C)   SpO2: 100%    Filed Weights   07/08/24 1047  Weight: 144 lb 3.2 oz (65.4 kg)     LABORATORY DATA:  I have reviewed the data as listed    Latest Ref Rng & Units 03/10/2024    2:00 PM 02/13/2024    6:07 AM 02/12/2024    1:07 PM  CMP  Glucose 70 - 99 mg/dL 90  82  889   BUN 8 - 23 mg/dL 26  15  17    Creatinine 0.44 - 1.00 mg/dL 8.76  8.75  8.52   Sodium 135 - 145 mmol/L 137  141  141   Potassium 3.5 - 5.1 mmol/L 4.4  4.3  3.2   Chloride 98 - 111 mmol/L 100  105  104   CO2 22 - 32 mmol/L 34  26  27   Calcium  8.9 - 10.3 mg/dL 9.6  8.8  8.9   Total Protein 6.5 - 8.1 g/dL 8.0     Total Bilirubin 0.0 - 1.2 mg/dL 0.4     Alkaline Phos 38 - 126 U/L 68     AST 15 - 41 U/L 27     ALT 0 - 44 U/L 21       Lab Results  Component Value Date   WBC 6.6 07/08/2024   HGB 10.6 (L) 07/08/2024   HCT 33.0 (L) 07/08/2024   MCV 83.5 07/08/2024   PLT 374 07/08/2024   NEUTROABS 3.5 07/08/2024    ASSESSMENT & PLAN:  Essential thrombocytosis (HCC) 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  She plans to go to [place] sometime in November and come back a year later.  If she does go to [place] instructed her that she needs to get the lab done in February or March 2026.   Stage II  colon cancer: T3 N0 M0 13 cm cecal mass status post surgery, 13 lymph nodes negative, MSI high indicating good prognosis.  No Lynch syndrome.     Patient wishes to follow-up with us  for  both her colon cancer needs as well as for surveillance of essential thrombocytosis. We will make appointments after she comes back to [place] . ------------------------------------- Assessment and Plan Assessment & Plan Essential thrombocythemia Platelet count increased to 374, managed with anagrelide  without issues. - Continue anagrelide  1 mg orally twice daily. - Prescribe anagrelide  with 3 refills, auto refill every 90 days. - Ensure prescription availability at [facility].  Iron  deficiency anemia Hemoglobin improved to 10.6, remains below normal, age-related marrow changes considered.  History of right colon cancer CEA levels pending, iron  levels stable, no immediate cancer concerns. - Monitor CEA levels and follow up on changes.      No orders of the defined types were placed in this encounter.  The patient has a good understanding of the overall plan. she agrees with it. she will call with any problems that may develop before the next visit here. Total time spent: 30 mins including face to face time and time spent for planning, charting and co-ordination of care   Naomi MARLA Chad, MD 07/08/24

## 2024-08-18 ENCOUNTER — Encounter: Payer: Self-pay | Admitting: Hematology and Oncology

## 2024-08-19 ENCOUNTER — Telehealth: Payer: Self-pay

## 2024-08-19 NOTE — Progress Notes (Unsigned)
   08/19/2024  Patient ID: [name], female   DOB: [birth year], 88 y.o.   MRN: [id number]

## 2024-08-19 NOTE — Progress Notes (Signed)
 08/19/2024 Name: [name] MRN: [id number] DOB: 1936-08-29  Chief Complaint  Patient presents with   Medication Management   Hypertension    Cortasia Screws is a 88 y.o. year old female who was referred for medication management by their primary care provider, Catalina Bare, MD. They presented for a face to face visit today.   They were referred to the pharmacist by their PCP for assistance in managing complex medication management    Subjective:  Care Team: Primary Care Provider: Catalina Bare, MD ; Next Scheduled Visit: 08/26/24   Medication Access/Adherence  Current Pharmacy:  DARRYLE LONG - [facility] [address] [place] [address] Phone: [phone] Fax: [phone]  [id number] [place], [place] - [address] [place] [address] Phone: [phone] Fax: [phone]  [facility] [address] [place] [address] Phone: [phone] Fax: [phone]   Patient reports affordability concerns with their medications: No  Patient reports access/transportation concerns to their pharmacy: No  Patient reports adherence concerns with their medications:  Yes  Patient's daughter helps her with medications. They fill 90-180 day supplies as she travels to [place] throughout the year.    Hypertension:  Current medications: *** Medications previously tried:   Patient {HAS/DOES NOT YJCZ:65250} a validated, automated, upper arm home BP cuff Current blood pressure readings readings: ***  Patient {Actions; denies-reports:120008} hypotensive s/sx including ***dizziness, lightheadedness.  Patient {Actions; denies-reports:120008} hypertensive symptoms including ***headache, chest pain, shortness of breath  Current meal patterns: ***  Current physical activity: ***   Medication Management:  Current adherence strategy: ***  Patient reports {Good Fair  Poor:9164727399} adherence to medications  Patient reports the following barriers to adherence: ***  Recent fill dates:    Objective:  Lab Results  Component Value Date   HGBA1C 5.7 (H) 01/18/2024    Lab Results  Component Value Date   CREATININE 1.23 (H) 03/10/2024   BUN 26 (H) 03/10/2024   NA 137 03/10/2024   K 4.4 03/10/2024   CL 100 03/10/2024   CO2 34 (H) 03/10/2024    Lab Results  Component Value Date   CHOL 216 (H) 07/11/2014   HDL 49 07/11/2014   LDLCALC 140 (H) 07/11/2014   TRIG 129 01/28/2024   CHOLHDL 4.4 07/11/2014    Medications Reviewed Today     Reviewed by Graylon Keen, [facility] (Pharmacist) on 08/19/24 at 1743  Med List Status: <None>   Medication Order Taking? Sig Documenting Provider Last Dose Status Informant  acetaminophen  (TYLENOL ) 500 MG tablet [id number] Yes Take 500 mg by mouth every 6 (six) hours as needed for mild pain (pain score 1-3) or moderate pain (pain score 4-6). [provider]  Active Pharmacy Records, Multiple Informants, Family Member, Care Giver  amLODipine  (NORVASC ) 5 MG tablet [id number] Yes Take 5 mg by mouth at bedtime. [provider]  Active Pharmacy Records, Multiple Informants, Family Member, Care Giver  anagrelide  (AGRYLIN) 1 MG capsule [id number] Yes Take 1 capsule (1 mg total) by mouth 2 (two) times daily. Gudena, Vinay, MD  Active            Med Note DELORAS, KEEN D   Tue Aug 19, 2024  5:24 PM) Pt still has 0.5 mg anagrelide , taking 2 capsules twice daily   docusate sodium  (COLACE) 100 MG capsule [id number] Yes Take 1 capsule (100 mg total) by mouth 2 (two) times daily as needed for mild constipation. Maczis, Michael  M, PA-C  Active   folic acid  (FOLVITE ) 1 MG tablet [id number] Yes Take 1 mg by mouth daily. [provider]  Active Pharmacy Records, Multiple Informants, Family Member, Care Giver  furosemide  (LASIX ) 20 MG tablet [id number] Yes Take 1 tablet (20 mg total) by mouth as needed. Elmira Newman PARAS, MD  Active Pharmacy Records, Multiple Informants, Family Member, Care Giver           Med Note STEFFI, ALEXANDRIA   Mon Feb 11, 2024  9:55 AM) Pt hasn't needed it in the last month   gabapentin (NEURONTIN) 300 MG capsule [id number] Yes Take 300 mg by mouth daily as needed (pain, muscle spasms). [provider]  Active Pharmacy Records, Multiple Informants, Family Member, Care Giver           Med Note STEFFI, ALEXANDRIA   Mon Feb 11, 2024  9:55 AM)    hydrochlorothiazide  (HYDRODIURIL ) 12.5 MG tablet [id number] Yes Take 12.5 mg by mouth daily as needed (high BP). [provider]  Active Pharmacy Records, Multiple Informants, Family Member, Care Giver           Med Note STEFFI, ADELITA Kitchens Feb 11, 2024  9:53 AM) Daughter is adamant the pt is using this medication PRN for high BP, with losartan   latanoprost  (XALATAN ) 0.005 % ophthalmic solution [id number] Yes Place 1 drop into both eyes at bedtime. [provider]  Active Pharmacy Records, Multiple Informants, Family Member, Care Giver  levothyroxine  (SYNTHROID ) 100 MCG tablet [id number] Yes Take 1 tablet (100 mcg total) by mouth daily before breakfast. Sonda Alm BROCKS, MD  Active Pharmacy Records, Multiple Informants, Family Member, Care Giver  losartan  (COZAAR ) 100 MG tablet [id number] Yes Take 100 mg by mouth daily.  Patient taking differently: Take 50 mg by mouth daily.   [provider]  Active     Discontinued 08/19/24 1717 (Dose change)  Patient not taking:   Discontinued 08/19/24 1732 (Patient has not taken in last 30 days)   Discontinued 08/19/24 1732 (Discontinued by provider)          Med Note STEFFI, ALEXANDRIA   Mon Feb 11, 2024  9:54 AM) Daughter is adamant this is how the pt is taking this medication.   mirtazapine  (REMERON ) 30 MG tablet [id number] Yes Take 30 mg by mouth at bedtime. [provider]  Active Pharmacy Records, Multiple Informants, Family Member, Care Giver   nitroGLYCERIN  (NITROSTAT ) 0.4 MG SL tablet [id number]  Place 1 tablet (0.4 mg total) under the tongue every 5 (five) minutes as needed for chest pain.  Patient not taking: Reported on 08/19/2024   Elmira Newman PARAS, MD  Active Pharmacy Records, Multiple Informants, Family Member, Care Giver           Med Note STEFFI ADELITA   Mon Feb 11, 2024  9:44 AM)    omeprazole  (PRILOSEC ) 20 MG capsule [id number] Yes TAKE ONE CAPSULE BY MOUTH EVERY DAY Patwardhan, Newman PARAS, MD  Active Pharmacy Records, Multiple Informants, Family Member, Care Giver  Polyethyl Glycol-Propyl Glycol (SYSTANE OP) [id number] Yes Place 1 drop into both eyes as needed (irritation). [provider]  Active Pharmacy Records, Multiple Informants, Family Member, Care Giver           Med Note DELORAS, HEATHER D   Tue Feb 05, 2024 11:57 AM) Only uses with irritation from latanoprost    polyethylene glycol (MIRALAX  / GLYCOLAX ) 17 g packet [id number] Yes Take 17 g by mouth 2 (two) times daily  as needed. Maczis, Michael M, PA-C  Active   potassium chloride  SA (KLOR-CON ) 20 MEQ tablet [id number]  TAKE 1 TABLET(20 MEQ) BY MOUTH DAILY WITH LASIX  AND FUROSEMIDE  AS NEEDED  Patient not taking: Reported on 08/19/2024   Elmira Newman PARAS, MD  Active Pharmacy Records, Multiple Informants, Family Member, Care Giver  propranolol  (INDERAL ) 10 MG tablet [id number] Yes Take 10 mg by mouth 2 (two) times daily.  Patient taking differently: Take 5 mg by mouth 3 (three) times daily.   [provider]  Active Pharmacy Records, Multiple Informants, Family Member, Care Giver           Med Note DELORAS KEEN D   Tue Aug 19, 2024  5:35 PM)    rosuvastatin  (CRESTOR ) 10 MG tablet [id number] Yes Take 10 mg by mouth daily. [provider]  Active Pharmacy Records, Multiple Informants, Family Member, Care Giver  senna (SENOKOT) 8.6 MG TABS tablet [id number] Yes Take 2 tablets (17.2 mg total) by mouth daily as needed for moderate  constipation. Judeth Trenda BIRCH, MD  Active Pharmacy Records, Multiple Informants, Family Member, Care Giver           Med Note STEFFI, ALEXANDRIA   Mon Feb 11, 2024  9:57 AM)    vitamin B-12 (CYANOCOBALAMIN) 1000 MCG tablet [id number] Yes Take 1,000 mcg by mouth daily. [provider]  Active Pharmacy Records, Multiple Informants, Family Member, Care Giver              Assessment/Plan:   Hypertension: - Currently {CHL Controlled/Uncontrolled:[phone]} - Reviewed long term cardiovascular and renal outcomes of uncontrolled blood pressure - Reviewed appropriate blood pressure monitoring technique and reviewed goal blood pressure. Recommended to check home blood pressure and heart rate *** - Recommend to ***      Medication Management: - Currently strategy {sufficient/insufficient:26424} to maintain appropriate adherence to prescribed medication regimen - Suggested use of weekly pill box to organize medications - Created list of medication, indication, and administration time. Provided to patient - Discussed collaboration with local pharmacies for adherence packaging. Reviewed local pharmacies with adherence packaging options. Patient elects to ***    Follow Up Plan: ***  ***

## 2024-08-20 ENCOUNTER — Ambulatory Visit: Payer: Medicare (Managed Care)

## 2024-08-20 DIAGNOSIS — B351 Tinea unguium: Secondary | ICD-10-CM | POA: Diagnosis not present

## 2024-08-20 DIAGNOSIS — E119 Type 2 diabetes mellitus without complications: Secondary | ICD-10-CM

## 2024-08-20 NOTE — Progress Notes (Signed)
  Subjective:  Patient ID: [name], female    DOB: August 06, 1936,  MRN: [id number]  88 y.o. female presents with chief concern of diabetes with elongated, thickened, painful, discolored toenails for months. Aggravating factor(s) include palpation. Patient has tried nothing for her toenails.  Chief Complaint  Patient presents with   [facility]    Rm20 diabetic foot care/ A1C 5    PCP is Osei-Bonsu, Zachary, MD  Allergies  Allergen Reactions   Penicillin G Hives   Shellfish Allergy Hives    Review of Systems: Negative except as noted in the HPI.   Objective:  [name] is a pleasant 88 y.o. female in NAD. AAO x 3.  Vascular Examination: Vascular status intact b/l with palpable pedal pulses. CFT immediate b/l. Pedal hair present. No edema. No pain with calf compression b/l. Skin temperature gradient WNL b/l. No varicosities noted. No cyanosis or clubbing noted.  Neurological Examination: Sensation grossly intact b/l with 10 gram monofilament. Negative tinel sign at tarsal tunnel bilaterally.   Dermatological Examination: Pedal skin with normal turgor, texture and tone b/l. No open wounds nor interdigital macerations noted. Toenails 1-5 b/l thick, discolored, elongated with subungual debris and pain on dorsal palpation. No hyperkeratotic lesions noted b/l.   Musculoskeletal Examination: Muscle strength 5/5 to b/l LE.  No pain, crepitus noted b/l. No gross pedal deformities. Patient ambulates independently without assistive aids.   Radiographs: None Last A1c:      Latest Ref Rng & Units 01/18/2024    4:56 AM  Hemoglobin A1C  Hemoglobin-A1c 4.8 - 5.6 % 5.7      Assessment:   1. Onychomycosis   2. Newly diagnosed diabetes [facility])     Plan:  Consent given for treatment. Diabetic foot examination performed today. All patient's and/or POA's questions/concerns addressed on today's visit. Toenails 1-5 b/l debrided in length and girth without incident. Continue foot and shoe inspections  daily. Monitor blood glucose per PCP/Endocrinologist's recommendations. Continue soft, supportive shoe gear daily. Report any pedal injuries to medical professional. Call office if there are any questions/concerns. -Patient/POA to call should there be question/concern in the interim.  RTC 3 months with Dr. Loreda, DPM or Dr. Gaynel, DPM  Prentice Ovens, [facility] AACFAS Fellowship Trained Podiatric Surgeon [facility]       [place] LOCATION: [address], [place] [address]                   Office [phone]   [facility] LOCATION: [address] [place], [place] [address] Office [phone]

## 2024-08-26 ENCOUNTER — Telehealth: Payer: Self-pay

## 2024-08-26 ENCOUNTER — Other Ambulatory Visit: Payer: Self-pay | Admitting: Hematology and Oncology

## 2024-08-26 ENCOUNTER — Other Ambulatory Visit (HOSPITAL_COMMUNITY): Payer: Self-pay

## 2024-08-26 MED ORDER — ANAGRELIDE HCL 1 MG PO CAPS
1.0000 mg | ORAL_CAPSULE | Freq: Two times a day (BID) | ORAL | 3 refills | Status: AC
  Filled 2024-08-26 – 2024-08-27 (×2): qty 180, 90d supply, fill #0
  Filled 2024-08-28 (×3): qty 80, 40d supply, fill #1

## 2024-08-26 NOTE — Progress Notes (Signed)
 08/26/2024 Name: [name] MRN: [id number] DOB: May 28, 1936  Chief Complaint  Patient presents with   Medication Management   Hypertension    [name] is a 88 y.o. year old female who was referred for medication management by their primary care provider, Catalina Bare, MD. They presented for a face to face visit today.   They were referred to the pharmacist by their PCP for assistance in managing hypertension and complex medication management    Subjective: Patient is here for final comprehensive medication review prior to her travel out of the country for 6 months. She presents with her daughter and her recently filled medications in their respective prescription vials. She does not have her blood pressure monitor log.   Care Team: Primary Care Provider: Catalina Bare, MD ; Next Scheduled Visit: TBD   Medication Access/Adherence  Current Pharmacy:  DARRYLE LONG - [facility] [address] [place] [address] Phone: [phone] Fax: [phone]  [facility] #[id number] [place], [place] - [address] [place] [address] Phone: [phone] Fax: [phone]  [facility] [address] [place] [address] Phone: [phone] Fax: [phone]   Patient reports affordability concerns with their medications: No  Patient reports access/transportation concerns to their pharmacy: Yes . The patient is traveling out of the country for the next 6 months.  Patient reports adherence concerns with their medications:  No     Hypertension:  Current medications: Amlodipine  5 mg twice daily, Losartan  100 mg daily Medications previously tried: hydrochlorothiazide , olmesartan-amlodipine   Patient has a validated, automated, upper arm home BP cuff Current blood pressure readings readings: The patient's daughter showed me a picture of a home reading of 130/80. They did not bring  her monitor or paper log with previous readings.  Patient denies hypotensive s/sx including dizziness, lightheadedness.  Patient denies hypertensive symptoms including headache, chest pain, shortness of breath  Her BP at the OV today was 130/83 mmHg.   Medication Management:  Current adherence strategy: extended day supply fills  Patient reports Fair adherence to medications  Patient reports the following barriers to adherence: travels out of country, needs fills for up to 6 months at a time  I was able to speak with her insurance company, and get an approval for a vacation override. They will allow up to a 180 day supply of her medications to be filled up to 14 days prior to departure.    Objective:  Lab Results  Component Value Date   HGBA1C 5.7 (H) 01/18/2024    Lab Results  Component Value Date   CREATININE 1.23 (H) 03/10/2024   BUN 26 (H) 03/10/2024   NA 137 03/10/2024   K 4.4 03/10/2024   CL 100 03/10/2024   CO2 34 (H) 03/10/2024     Medications Reviewed Today     Reviewed by Graylon Keen, [facility] (Pharmacist) on 08/26/24 at 1136  Med List Status: <None>   Medication Order Taking? Sig Documenting Provider Last Dose Status Informant  acetaminophen  (TYLENOL ) 500 MG tablet [id number] Yes Take 500 mg by mouth every 6 (six) hours as needed for mild pain (pain score 1-3) or moderate pain (pain score 4-6). [provider]  Active Pharmacy Records, Multiple Informants, Family Member, Care Giver   Discontinued 08/26/24 1135 (Dose change)    Patient not taking:   Discontinued 08/26/24 1135 (Dose change) amLODipine  (NORVASC ) 5 MG tablet [id number] Yes Take 5 mg by mouth in  the morning and at bedtime. [provider]  Active   anagrelide  (AGRYLIN) 1 MG capsule [id number] Yes Take 1 capsule (1 mg total) by mouth 2 (two) times daily. Gudena, Vinay, MD  Active            Med Note DELORAS, HEATHER D   Tue Aug 19, 2024  5:24 PM) Pt still has 0.5 mg anagrelide , taking  2 capsules twice daily   docusate sodium  (COLACE) 100 MG capsule [id number] Yes Take 1 capsule (100 mg total) by mouth 2 (two) times daily as needed for mild constipation. Maczis, Michael M, PA-C  Active   folic acid  (FOLVITE ) 1 MG tablet [id number] Yes Take 1 mg by mouth daily. [provider]  Active Pharmacy Records, Multiple Informants, Family Member, Care Giver  furosemide  (LASIX ) 20 MG tablet [id number]  Take 1 tablet (20 mg total) by mouth as needed.  Patient not taking: Reported on 08/26/2024   Elmira Newman PARAS, MD  Active Pharmacy Records, Multiple Informants, Family Member, Care Giver           Med Note STEFFI, ALEXANDRIA   Mon Feb 11, 2024  9:55 AM) Pt hasn't needed it in the last month   gabapentin (NEURONTIN) 300 MG capsule [id number] Yes Take 300 mg by mouth daily as needed (pain, muscle spasms). [provider]  Active Pharmacy Records, Multiple Informants, Family Member, Care Giver           Med Note STEFFI ADELITA Kitchens Feb 11, 2024  9:55 AM)      Discontinued 08/26/24 1124 (Discontinued by provider)          Med Note STEFFI, ALEXANDRIA   Mon Feb 11, 2024  9:53 AM) Daughter is adamant the pt is using this medication PRN for high BP, with losartan   latanoprost  (XALATAN ) 0.005 % ophthalmic solution [id number] Yes Place 1 drop into both eyes at bedtime. [provider]  Active Pharmacy Records, Multiple Informants, Family Member, Care Giver  levothyroxine  (SYNTHROID ) 100 MCG tablet [id number] Yes Take 1 tablet (100 mcg total) by mouth daily before breakfast. Sonda Alm BROCKS, MD  Active Pharmacy Records, Multiple Informants, Family Member, Care Giver  losartan  (COZAAR ) 100 MG tablet [id number] Yes Take 100 mg by mouth daily. [provider]  Active   mirtazapine  (REMERON ) 30 MG tablet [id number] Yes Take 30 mg by mouth at bedtime. [provider]  Active Pharmacy Records, Multiple Informants, Family Member, Care Giver  nitroGLYCERIN   (NITROSTAT ) 0.4 MG SL tablet [id number]  Place 1 tablet (0.4 mg total) under the tongue every 5 (five) minutes as needed for chest pain.  Patient not taking: Reported on 08/26/2024   Elmira Newman PARAS, MD  Active Pharmacy Records, Multiple Informants, Family Member, Care Giver           Med Note STEFFI, ADELITA   Mon Feb 11, 2024  9:44 AM)    omeprazole  (PRILOSEC ) 20 MG capsule [id number] Yes TAKE ONE CAPSULE BY MOUTH EVERY DAY Patwardhan, Newman PARAS, MD  Active Pharmacy Records, Multiple Informants, Family Member, Care Giver  Polyethyl Glycol-Propyl Glycol [place] OP) [id number] Yes Place 1 drop into both eyes as needed (irritation). [provider]  Active Pharmacy Records, Multiple Informants, Family Member, Care Giver           Med Note DELORAS, HEATHER D   Tue Feb 05, 2024 11:57 AM) Only uses with irritation from latanoprost    polyethylene glycol (MIRALAX  / GLYCOLAX ) 17 g packet [id number] Yes Take 17  g by mouth 2 (two) times daily as needed. Maczis, Michael M, PA-C  Active   potassium chloride  SA (KLOR-CON ) 20 MEQ tablet [id number]  TAKE 1 TABLET(20 MEQ) BY MOUTH DAILY WITH LASIX  AND FUROSEMIDE  AS NEEDED  Patient not taking: Reported on 08/26/2024   Elmira Newman PARAS, MD  Active Pharmacy Records, Multiple Informants, Family Member, Care Giver  propranolol  (INDERAL ) 10 MG tablet [id number] Yes Take 10 mg by mouth 2 (two) times daily. [provider]  Active Pharmacy Records, Multiple Informants, Family Member, Care Giver           Med Note DELORAS KEEN D   Tue Aug 19, 2024  5:35 PM)    rosuvastatin  (CRESTOR ) 10 MG tablet [id number] Yes Take 10 mg by mouth daily. [provider]  Active Pharmacy Records, Multiple Informants, Family Member, Care Giver  senna (SENOKOT) 8.6 MG TABS tablet [id number] Yes Take 2 tablets (17.2 mg total) by mouth daily as needed for moderate constipation. Judeth Trenda BIRCH, MD  Active Pharmacy Records, Multiple Informants, Family Member,  Care Giver           Med Note STEFFI, ALEXANDRIA   Mon Feb 11, 2024  9:57 AM)    vitamin B-12 (CYANOCOBALAMIN) 1000 MCG tablet [id number] Yes Take 1,000 mcg by mouth daily. [provider]  Active Pharmacy Records, Multiple Informants, Family Member, Care Giver              Assessment/Plan:   Hypertension: - Currently uncontrolled - Reviewed long term cardiovascular and renal outcomes of uncontrolled blood pressure - Reviewed appropriate blood pressure monitoring technique and reviewed goal blood pressure. Recommended to check home blood pressure and heart rate daily - Recommend to continue current regimen      Medication Management: - Currently strategy sufficient to maintain appropriate adherence to prescribed medication regimen - Suggested use of weekly pill box to organize medications - Created list of medication, indication, and administration time. Provided to patient - Discussed vacation override with dispensing pharmacies, collaborated for refills of chronic medications for 180 day supply.  - I provided the patient's daughter my contact information for any additional assistance needed.     Follow Up Plan: Will follow up with patient in 6 months upon her return.  KEEN Factor, PharmD Clinical Pharmacist  [phone]

## 2024-08-27 ENCOUNTER — Other Ambulatory Visit: Payer: Self-pay

## 2024-08-27 ENCOUNTER — Other Ambulatory Visit (HOSPITAL_COMMUNITY): Payer: Self-pay

## 2024-08-28 ENCOUNTER — Other Ambulatory Visit (HOSPITAL_COMMUNITY): Payer: Self-pay
